# Patient Record
Sex: Female | Born: 1950
Health system: Southern US, Community
[De-identification: ages and names within clinical notes are randomized; demographics above are authoritative.]

## PROBLEM LIST (undated history)

## (undated) DIAGNOSIS — Z923 Personal history of irradiation: Secondary | ICD-10-CM

## (undated) DIAGNOSIS — E7800 Pure hypercholesterolemia, unspecified: Secondary | ICD-10-CM

## (undated) DIAGNOSIS — Z9889 Other specified postprocedural states: Secondary | ICD-10-CM

## (undated) DIAGNOSIS — E079 Disorder of thyroid, unspecified: Secondary | ICD-10-CM

## (undated) DIAGNOSIS — K602 Anal fissure, unspecified: Secondary | ICD-10-CM

## (undated) DIAGNOSIS — M1990 Unspecified osteoarthritis, unspecified site: Secondary | ICD-10-CM

## (undated) DIAGNOSIS — S42309A Unspecified fracture of shaft of humerus, unspecified arm, initial encounter for closed fracture: Secondary | ICD-10-CM

## (undated) DIAGNOSIS — C50911 Malignant neoplasm of unspecified site of right female breast: Secondary | ICD-10-CM

## (undated) DIAGNOSIS — Z9221 Personal history of antineoplastic chemotherapy: Secondary | ICD-10-CM

## (undated) DIAGNOSIS — E039 Hypothyroidism, unspecified: Secondary | ICD-10-CM

## (undated) DIAGNOSIS — R112 Nausea with vomiting, unspecified: Secondary | ICD-10-CM

## (undated) HISTORY — DX: Anal fissure, unspecified: K60.2

## (undated) HISTORY — DX: Disorder of thyroid, unspecified: E07.9

## (undated) HISTORY — PX: MOUTH SURGERY: SHX715

## (undated) HISTORY — DX: Pure hypercholesterolemia, unspecified: E78.00

## (undated) HISTORY — DX: Unspecified osteoarthritis, unspecified site: M19.90

## (undated) HISTORY — DX: Unspecified fracture of shaft of humerus, unspecified arm, initial encounter for closed fracture: S42.309A

## (undated) HISTORY — PX: TUBAL LIGATION: SHX77

## (undated) HISTORY — PX: JOINT REPLACEMENT: SHX530

## (undated) HISTORY — PX: EYE SURGERY: SHX253

---

## 1997-03-05 HISTORY — PX: BREAST BIOPSY: SHX20

## 1997-10-14 ENCOUNTER — Other Ambulatory Visit: Admission: RE | Admit: 1997-10-14 | Discharge: 1997-10-14 | Payer: Self-pay | Admitting: Obstetrics and Gynecology

## 1997-11-03 ENCOUNTER — Other Ambulatory Visit: Admission: RE | Admit: 1997-11-03 | Discharge: 1997-11-03 | Payer: Self-pay | Admitting: Surgery

## 1997-11-03 DIAGNOSIS — C50911 Malignant neoplasm of unspecified site of right female breast: Secondary | ICD-10-CM

## 1997-11-03 HISTORY — DX: Malignant neoplasm of unspecified site of right female breast: C50.911

## 1997-11-03 HISTORY — PX: BREAST LUMPECTOMY: SHX2

## 1997-11-05 ENCOUNTER — Ambulatory Visit (HOSPITAL_BASED_OUTPATIENT_CLINIC_OR_DEPARTMENT_OTHER): Admission: RE | Admit: 1997-11-05 | Discharge: 1997-11-05 | Payer: Self-pay | Admitting: Surgery

## 1997-11-09 ENCOUNTER — Encounter: Admission: RE | Admit: 1997-11-09 | Discharge: 1998-02-07 | Payer: Self-pay | Admitting: Radiation Oncology

## 1997-11-19 ENCOUNTER — Ambulatory Visit (HOSPITAL_COMMUNITY): Admission: RE | Admit: 1997-11-19 | Discharge: 1997-11-20 | Payer: Self-pay | Admitting: Surgery

## 1997-11-19 ENCOUNTER — Encounter: Payer: Self-pay | Admitting: Surgery

## 1998-01-04 ENCOUNTER — Encounter: Payer: Self-pay | Admitting: Oncology

## 1998-01-04 ENCOUNTER — Ambulatory Visit (HOSPITAL_COMMUNITY): Admission: RE | Admit: 1998-01-04 | Discharge: 1998-01-04 | Payer: Self-pay | Admitting: Oncology

## 1998-02-22 ENCOUNTER — Encounter: Admission: RE | Admit: 1998-02-22 | Discharge: 1998-05-23 | Payer: Self-pay | Admitting: Radiation Oncology

## 1998-03-31 ENCOUNTER — Encounter: Payer: Self-pay | Admitting: Surgery

## 1998-03-31 ENCOUNTER — Ambulatory Visit (HOSPITAL_BASED_OUTPATIENT_CLINIC_OR_DEPARTMENT_OTHER): Admission: RE | Admit: 1998-03-31 | Discharge: 1998-03-31 | Payer: Self-pay | Admitting: Surgery

## 1998-10-18 ENCOUNTER — Other Ambulatory Visit: Admission: RE | Admit: 1998-10-18 | Discharge: 1998-10-18 | Payer: Self-pay | Admitting: Obstetrics and Gynecology

## 1999-03-24 ENCOUNTER — Encounter: Admission: RE | Admit: 1999-03-24 | Discharge: 1999-03-24 | Payer: Self-pay | Admitting: Oncology

## 1999-03-24 ENCOUNTER — Encounter: Payer: Self-pay | Admitting: Oncology

## 1999-08-24 ENCOUNTER — Encounter: Payer: Self-pay | Admitting: Oncology

## 1999-08-24 ENCOUNTER — Encounter: Admission: RE | Admit: 1999-08-24 | Discharge: 1999-08-24 | Payer: Self-pay | Admitting: Oncology

## 1999-10-24 ENCOUNTER — Other Ambulatory Visit: Admission: RE | Admit: 1999-10-24 | Discharge: 1999-10-24 | Payer: Self-pay | Admitting: Obstetrics and Gynecology

## 2000-07-25 ENCOUNTER — Other Ambulatory Visit: Admission: RE | Admit: 2000-07-25 | Discharge: 2000-07-25 | Payer: Self-pay | Admitting: Obstetrics and Gynecology

## 2000-08-27 ENCOUNTER — Encounter: Admission: RE | Admit: 2000-08-27 | Discharge: 2000-08-27 | Payer: Self-pay | Admitting: Oncology

## 2000-08-27 ENCOUNTER — Encounter: Payer: Self-pay | Admitting: Oncology

## 2000-10-30 ENCOUNTER — Other Ambulatory Visit: Admission: RE | Admit: 2000-10-30 | Discharge: 2000-10-30 | Payer: Self-pay | Admitting: Obstetrics and Gynecology

## 2001-05-27 ENCOUNTER — Encounter: Payer: Self-pay | Admitting: Family Medicine

## 2001-05-27 ENCOUNTER — Ambulatory Visit (HOSPITAL_COMMUNITY): Admission: RE | Admit: 2001-05-27 | Discharge: 2001-05-27 | Payer: Self-pay | Admitting: Family Medicine

## 2001-08-28 ENCOUNTER — Encounter: Admission: RE | Admit: 2001-08-28 | Discharge: 2001-08-28 | Payer: Self-pay | Admitting: Oncology

## 2001-08-28 ENCOUNTER — Encounter: Payer: Self-pay | Admitting: Oncology

## 2001-11-07 ENCOUNTER — Other Ambulatory Visit: Admission: RE | Admit: 2001-11-07 | Discharge: 2001-11-07 | Payer: Self-pay | Admitting: Obstetrics and Gynecology

## 2002-02-02 HISTORY — PX: BLADDER SUSPENSION: SHX72

## 2002-02-02 HISTORY — PX: VAGINAL HYSTERECTOMY: SUR661

## 2002-02-17 ENCOUNTER — Observation Stay (HOSPITAL_COMMUNITY): Admission: RE | Admit: 2002-02-17 | Discharge: 2002-02-18 | Payer: Self-pay | Admitting: Obstetrics and Gynecology

## 2002-02-17 ENCOUNTER — Encounter (INDEPENDENT_AMBULATORY_CARE_PROVIDER_SITE_OTHER): Payer: Self-pay

## 2002-02-18 ENCOUNTER — Encounter (INDEPENDENT_AMBULATORY_CARE_PROVIDER_SITE_OTHER): Payer: Self-pay | Admitting: Specialist

## 2002-08-31 ENCOUNTER — Encounter: Payer: Self-pay | Admitting: Oncology

## 2002-08-31 ENCOUNTER — Encounter: Admission: RE | Admit: 2002-08-31 | Discharge: 2002-08-31 | Payer: Self-pay | Admitting: Oncology

## 2003-09-01 ENCOUNTER — Encounter: Admission: RE | Admit: 2003-09-01 | Discharge: 2003-09-01 | Payer: Self-pay | Admitting: Internal Medicine

## 2003-09-01 ENCOUNTER — Encounter: Admission: RE | Admit: 2003-09-01 | Discharge: 2003-09-01 | Payer: Self-pay | Admitting: Oncology

## 2004-09-01 ENCOUNTER — Encounter: Admission: RE | Admit: 2004-09-01 | Discharge: 2004-09-01 | Payer: Self-pay | Admitting: Obstetrics and Gynecology

## 2005-09-03 ENCOUNTER — Encounter: Admission: RE | Admit: 2005-09-03 | Discharge: 2005-09-03 | Payer: Self-pay | Admitting: Oncology

## 2006-09-10 ENCOUNTER — Encounter: Admission: RE | Admit: 2006-09-10 | Discharge: 2006-09-10 | Payer: Self-pay | Admitting: Obstetrics & Gynecology

## 2007-09-15 ENCOUNTER — Encounter: Admission: RE | Admit: 2007-09-15 | Discharge: 2007-09-15 | Payer: Self-pay | Admitting: Family Medicine

## 2007-09-19 ENCOUNTER — Encounter: Admission: RE | Admit: 2007-09-19 | Discharge: 2007-09-19 | Payer: Self-pay | Admitting: Family Medicine

## 2007-09-29 ENCOUNTER — Other Ambulatory Visit: Admission: RE | Admit: 2007-09-29 | Discharge: 2007-09-29 | Payer: Self-pay | Admitting: Obstetrics and Gynecology

## 2008-09-20 ENCOUNTER — Encounter: Admission: RE | Admit: 2008-09-20 | Discharge: 2008-09-20 | Payer: Self-pay | Admitting: Family Medicine

## 2009-09-22 ENCOUNTER — Encounter: Admission: RE | Admit: 2009-09-22 | Discharge: 2009-09-22 | Payer: Self-pay | Admitting: Family Medicine

## 2009-11-17 ENCOUNTER — Ambulatory Visit: Payer: Self-pay | Admitting: Family Medicine

## 2009-11-25 ENCOUNTER — Ambulatory Visit: Payer: Self-pay | Admitting: Family Medicine

## 2010-03-27 ENCOUNTER — Encounter: Payer: Self-pay | Admitting: Family Medicine

## 2010-07-21 NOTE — Op Note (Signed)
[name], [name]                             ACCOUNT NO.:  [id number]   MEDICAL RECORD NO.:  [id number]                   PATIENT TYPE:  INP   LOCATION:  9317                                 FACILITY:  [facility]   PHYSICIAN:  Laqueta Linden, M.D.                 DATE OF BIRTH:  [birth year]   DATE OF PROCEDURE:  02/17/2002  DATE OF DISCHARGE:                                 OPERATIVE REPORT   PREOPERATIVE DIAGNOSES:  1. Postmenopausal bleeding with large endometrial polyp on tamoxifen.  2. Adnexal cyst.  3. Pelvic relaxation with cystocele and rectocele.  4. Stress urinary incontinence.   POSTOPERATIVE DIAGNOSES:  1. Postmenopausal bleeding with large endometrial polyp on tamoxifen.  2. Adnexal cyst.  3. Pelvic relaxation with cystocele and rectocele.  4. Stress urinary incontinence.   PROCEDURE:  1. Laparoscopically-assisted vaginal hysterectomy with bilateral salpingo-     oophorectomy, anterior and posterior colporrhaphy.  2. SPARC with cystoscopy.   SURGEON:  Laqueta Linden, M.D.   ASSISTANTS:  1. Andres Ege, M.D.  2. Procedure #2, Courtney Paris, M.D. with Laqueta Linden, M.D.   ANESTHESIA:  General endotracheal.   ESTIMATED BLOOD LOSS:  250 cc.   URINE OUTPUT:  300 cc.   FLUIDS:  3200 cc of crystalloid.   COUNTS:  Correct x2.   INDICATIONS:  The patient is a 60 year old gravida 2, para 2 menopausal  female with breast cancer on tamoxifen for the past four years.  She has had  persistent postmenopausal bleeding and was evaluated by a pelvic ultrasound  with sonohysterogram revealing a 3.2 cm x 2.1 cm broad based top that  virtually filled the endometrial cavity.  In addition, she was noted to have  a 2.3 cm cystic mass involving the left ovary that was consistent with an  endometrioma.  In addition, she had symptomatic cystocele and rectocele with  stress urinary incontinence.  She elected to undergo definitive surgical  management of the above.  She is  going to undergo laparoscopic evaluation of  the ovaries with laparoscopically-assisted vaginal hysterectomy with removal  of both tubes and ovaries as indicated followed by anterior and posterior  repair and SPARC procedure per Dr. Vic Blackbird.  She has seen both  surgeons and was extensively counseled as to the risks, benefits and  alternatives and complications including but not limited to anesthesia,  risks of infection, bleeding, possibly requiring transfusion, injury to  bowel, bladder or ureters, vessels, or nerves, possibility of fistula  formation, possibility of urinary retention, or recurrent prolapse or  recurrent stress urinary incontinence as well as postoperative urinary  urgency symptoms as well as other postoperative complications including DVT,  PE and pneumonia, death or other unknown risks.  She has seen the informed  consent film and has voiced understanding of the symptoms and all risks, and  agrees to proceed.  She  has undergone a mechanical bowel prep prior to  surgery and received 1 g of Ancef preoperatively for prophylaxis.   DESCRIPTION OF PROCEDURE:  The patient was taken to the operating room and  after proper identification and consent were ascertained, she was placed on  the operating table in the supine position.  After the induction of general  endotracheal anesthesia, she was placed in the [place] stirrups, and the  abdomen, perineum and vagina were prepped and draped in the routine sterile  fashion.  A transurethral Foley was placed.  A Hulka tenaculum was placed on  the anterior lip of the cervix.  Attention was then turned abdominally.  A 2  cm infraumbilical incision was made.  The Veress needle was inserted with  intraperitoneal placement confirmed by saline drop and saline installation.  Two liters of CO2 were infused and it appeared that the patient was  developing a pneumoperitoneum.  However, upon removal of the Veress and  insertion of the  laparoscope into the peritoneal cavity, it appeared that  most of the gas had gone into the subcu, and there was subcutaneous  emphysema noted.  There was no obvious injury to bowel, omentum or any  visible structures, and throughout the course of the procedure, the  subcutaneous emphysema resolved.  A pneumoperitoneum was established, and a  5 mm port was placed in the right mid quadrant and a 10 mm in the left mid  quadrant under direct vision.  Peritoneal washings were taken initially and  sent to cytology.  Inspection of the lower abdomen revealed a freely mobile  uterus.  Both tubes were status post Falope ring application.  Both ovaries  were small, atrophic, consistent with menopausal ovaries.  There was no cyst  involving the left ovary, however, there was a large multiple blebs of cysts  appearing to be some type of peritoneal inclusion cysts that was just  adjacent to the ovary.  This was freely mobile and was removed and  separately to pathology.  There was some additional cysts present in the cul-  de-sac, and these were excised and sent as a separate specimen as well.  These appeared to be just peritoneal blebs and to be completely benign in  appearance.  The tripolar cautery was utilized to cauterize and transect the  adnexa starting at the uterine cornua and transecting the round ligament,  utero-ovarian ligament and tube.  This was dissected back such that 0 Vicryl  Endo-loops could be placed across the infundibulopelvic ligaments.  Then 2-0  Vicryl Endoscopist-loops were placed and the tube and ovary was then  excised.  An identical procedure was performed on both sides.  Both adnexa  were easily removed through the 10 mm port and sent as a final specimen with  the uterus.  Hemostasis was noted to be excellent.  At this point, the  procedure went transvaginally.  A weighted speculum was placed on the posterior vagina and the cervix grasped with a single-tooth tenaculum.   The  portio was then injected circumferentially with 1:100,000 epinephrine.  The  cervix was then circumscribed and the posterior vaginal mucosa advanced off  the of cervix, and the cul-de-sac entered sharply using curved Mayo scissors  without difficulty.  Aspiration of lavaged fluid was accomplished.  The  uterosacral ligaments were then clamped and ligated with 0 Vicryl suture and  tacked.  Cardinal ligaments were similarly clamped, cut and suture ligated.  The anterior peritoneal reflection was identified and entered sharply with  an obvious injury or entry into the bladder.  A retractor was placed  anteriorly to retract the bladder out of the operative field.  The uterine  vessels were then clamped bilaterally incorporating both the anterior and  posterior peritoneum with pedicles cut and suture ligated.  At this point,  there was a small pedicle left on both sides.  This was clamped and cut with  removal of the uterus in its entirety which was sent with the tube and ovary  specimens for final sectioning.  Both pedicles were doubly ligated with 0  Vicryl.  Hemostasis was noted to be excellent.  There was some oozing from  the posterior vaginal cuff.  The posterior vaginal cuff was roofed to the  posterior peritoneum using a running locked stitch of 0 Vicryl with marked  improvement in hemostasis.  A McCall suture was placed through the vaginal  mucosa, through the uterosacral ligaments with plication of the cul-de-sac  mucosa to prevent an enterocele formation.  This suture was tied at the  conclusion of the procedure.  The parietal peritoneum was then closed in a  pursestring fashion using 2-0 Vicryl suture.  The counts were correct prior  to closure of the peritoneum.  The bottom half of the vaginal cuff was then  closed.  The uterosacral ties were then tied in the midline.  The cuff was  closed from side-to-side using interrupted figure-of-eight sutures of 0  Vicryl.  Attention was  then turned to the anterior vaginal wall.  The edges  of the anterior vaginal cuff were grasped with Allis clamps and sterile  saline used to provide hydrodissection was then injected, and the vaginal  mucosa was undermined and incised in the midline to the level of the mid  urethra per Dr. Valda Lamb instruction.  The vaginal mucosa was then  sharply and bluntly dissected off of the perivesical fascia.  At this point,  Dr. Aldean Ast performed a [facility] procedure with cystoscopy.  This will be  dictated in a separate operative note.  At the conclusion of the [facility]  procedure, the surgeon then continued with the anterior repair using 2-0  Vicryl suture to pull the paravesical tissues over the vaginal tape as per  Dr. Valda Lamb instructions as well as to reduce the remainder of the  cystocele higher up through its entire length.  Interrupted sutures were  placed with good reduction of the cystocele.  Redundant vaginal mucosa was trimmed, and the anterior vaginal wall was then closed with a running locked  stitch of 2-0 Vicryl suture.  The remainder of the anterior vaginal cuff was  closed with interrupted figure-of-eight sutures of 0 Vicryl.  Attention was  then turned to the posterior repair.  A inverted triangular incision was  made at the introitus with excision of scar tissue.  Injection with sterile  saline with undermining and incision of the vaginal mucosa was then  performed to the apex of the rectocele.   All tissues were then dissected off of the vaginal mucosa and 2-0 Vicryl  suture was used to pull the perirectal fascia to the midline with reduction  of the rectocele.  Redundant vaginal mucosa was trimmed and the posterior  vaginal wall was then closed in a running locked fashion with closure of the  perineum in a routine fashion.  Care was taken not to create a stepoff or to  erode any of the vagina.  At the conclusion of the procedure, the introitus  was two fingerbreadths and  approximately 6 cm in depth.  Rectal examination  confirmed redundant rectal mucosa but no sutures into the rectum.  A vaginal  packing soaked with estrogen cream was placed to be removed the morning  after surgery.  The Foley was also placed to be removed at the time the  packing was removed per Dr. Valda Lamb instructions.  The surgeon and scrub  tech then regloved and turned again abdominally.  A pneumoperitoneum was re-  established and pedicles and suture sites were observed and noted to have  excellent hemostasis.  The 10 mm port was removed under direct vision and  then had suture of the fascia performed while watching intra-abdominally.  The 5 mm port was then removed under direct vision as well.  The  pneumoperitoneum was allowed to escape and was actually reestablished with  no active bleeding or clotting in any of the pedicles.  The pneumoperitoneum  was then allowed to escape definitively and the laparoscope was then removed  under direct vision.  The umbilical incision was closed with subcuticular  suture of 4-0 Vicryl.  The other two incisions as well as the two suprapubic  incisions for the vaginal tape were closed using Dermabond.  Steri-Strips  and pressure dressings were applied to all incisions.  The incisions were  injected with a total of 10 cc of 0.25% Marcaine for local analgesia.  The  patient received Toradol 30 mg IV, 30 mg IM upon transfer to the recovery  room.  She was awakened and stable on transfer.  Estimated blood loss was  250 cc.  Urine output was 300 cc.  Fluids 3200 cc of crystalloid.  Counts  were correct x2.  Complications none.                                                Laqueta Linden, M.D.    LKS/MEDQ  D:  02/17/2002  T:  02/18/2002  Job:  [id number]   cc:   Leighton Roach. Truett Perna, M.D.  [address]- [facility]  [place]  [place]  [address]  Fax: [phone]   Sandria Bales. Ezzard Standing, M.D.  [address]  [place]  [place] [address]  Fax:  [phone]  Maryln Gottron, M.D.  [address] - [facility]  [place]  [place]  [address]  Fax: [phone]   Holley Bouche, M.D.  [address]  [place], [place] [address]  Fax: [phone]

## 2010-07-21 NOTE — Op Note (Signed)
NAME:  [name], [name]                             ACCOUNT NO.:  [id number]   MEDICAL RECORD NO.:  [id number]                   PATIENT TYPE:  INP   LOCATION:  9317                                 [facility]ACILITY:  [facility]   PHYSICIAN:  Rozanna Boer., M.D.      DATE OF BIRTH:  1950-06-01   DATE OF PROCEDURE:  02/17/2002  DATE OF DISCHARGE:                                 OPERATIVE REPORT   PREOPERATIVE DIAGNOSIS:  Stress urinary incontinence.   POSTOPERATIVE DIAGNOSIS:  Stress urinary incontinence.   PROCEDURE:  SPARC urethral vaginal sling procedure.   SURGEON:  Courtney Paris, M.D.   ASSISTANT:  Laqueta Linden, M.D.    General.   INDICATIONS:  This 60 year old patient who is postmenopausal and after  breast cancer who is undergoing a hysterectomy, bilateral salpingo-  oophorectomy, anterior and posterior repair and requests to have procedure  for stress urinary incontinence at the same time.  I had seen her previously  in the office and had gone over all the risks, complications, and benefits  of a sling procedure.   DESCRIPTION OF PROCEDURE:  After the uterus had been removed, the cuff had  been closed, and the anterior repair had been divided up to the mid urethral  area by Dr. Myrlene Broker, I was able to use a little saline and dissect on  either side of the urethra just enough to get my finger into the space just  lateral to the pelvic bone.  Then a small stab incision two fingerbreadths  above the symphysis pubis on either side of the midline was then made and  opened with a hemostat.  I then passed the [facility] needles hugging the  symphysis pubis down to the previously created space on each side of the mid  urethra and popped these through the endopelvic fascia.  This was done with  the Foley catheter in place so that I could know where the bladder neck was.  Then I attached the SPARC sling to the needles and then pulled this up into  the suprapubic space.  Using a  hemostat clamp, I left a space that was  fairly loose probably at least 1 cm or more between the urethra and the  sling to make sure it was loose and then remove the sheath of the [facility]  tape.  This was done first on the left and then the right with the midline  where it was supposed to me.  I then just a little tension with the blue  suture that was running along the tape and then cut the tape down below the  skin line in the suprapubic area on either side.  This left the sling in a  good position, loose but in a good anatomic position to prevent her stress  incontinence.  Prior to pulling up the tapes I passed a cystoscope with a  right angle lens and made sure  that the bladder had not been transversed by  the needles which it was not.  A Foley catheter was then replaced, and the  bladder drained and the vaginal tissue was then closed over the tape in two  layers with Vicryl sutures.  When this was done, the rest of the procedure  will be dictated by Dr. Myrlene Broker.  Dermabond will be  placed on the suprapubic site, and she will have a catheter overnight and  then removed in the morning.  If she is unable to void, the catheter will be  reinserted, and I will see her in the office tomorrow.  I plan to see her in  one week in follow up to make sure she is voiding well.                                               Rozanna Boer., M.D.    HMK/MEDQ  D:  02/17/2002  T:  02/18/2002  Job:  [id number]

## 2010-08-08 ENCOUNTER — Ambulatory Visit (INDEPENDENT_AMBULATORY_CARE_PROVIDER_SITE_OTHER): Payer: BC Managed Care – PPO | Admitting: Family Medicine

## 2010-08-08 ENCOUNTER — Encounter: Payer: Self-pay | Admitting: Family Medicine

## 2010-08-08 VITALS — BP 133/80 | Ht 64.0 in | Wt 145.0 lb

## 2010-08-08 DIAGNOSIS — M25569 Pain in unspecified knee: Secondary | ICD-10-CM | POA: Insufficient documentation

## 2010-08-08 MED ORDER — MELOXICAM 15 MG PO TABS: 15.0000 mg | ORAL_TABLET | Freq: Every day | ORAL | Status: AC

## 2010-08-08 NOTE — Progress Notes (Signed)
  Subjective:    Patient ID: [name], female    DOB: [birth year], 60 y.o.   MRN: [id number]  HPI 60 year old new patient to our office for evaluation of right knee pain. Pain has been ongoing intermittently for the past 1.5 years. She is an avid cyclist going 10-15 miles per week, recalls pain starting after a long ride approximately 1.5 years ago. No specific injury or trauma. Pain is worse along the medial aspect of the knee. She has increased pain and stiffness at rest. She is experiencing nighttime pain. Has occasional clicking, but denies any locking, catching, or instability. No significant swelling. Does feel like she has decreased range of motion and fullness in the back of her knee. Taking Advil 400-600 mg as needed. He is using a brace periodically. Remote history of knee injury 30 years ago while rafting, was never evaluated.  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 Denies any tobacco use. She is married. FAMILY history: Mother with rheumatoid arthritis   Review of Systems Per history of present illness, otherwise negative    Objective:   Physical Exam GENERAL: Alert and oriented x3, no acute distress, pleasant, well-developed, well-nourished SKIN: No rashes or lesions RESPIRATORY: Respirations 15 and unlabored MSK: - Knee: Right knee with range of motion 0-115 with 2+ crepitation. 1+ synovitis, trace effusion. Tender to palpation along the medial joint line, no lateral joint line tenderness. Positive McMurray medially. No ligamentous laxity. Left knee with range of motion 0-120 with 1+ crepitation, no swelling, no effusion, no tenderness, no laxity. - Hips: Full range of motion bilaterally without pain. Good strength. Negative logroll. - Gait: Walking without a limp, no leg length difference Neurovascularly intact distally  MSK ultrasound: Right knee- normal-appearing  quadriceps tendon. Increased fluid in the suprapatellar pouch consistent with a small joint effusion. Normal appearing patellar tendon. Lateral meniscus with fragmentation and small pseudo-meniscal cyst appreciated. Medial meniscus with fragmentation and extrusion from the joint noted. Images saved       Assessment & Plan:

## 2010-08-08 NOTE — Assessment & Plan Note (Signed)
Right knee pain with MSK ultrasound showing DJD as well as degenerative meniscal tear - Check standing x-rays to assess full extent of DJD - Will call with results when available - Rx for Mobic to help with pain and inflammation - Should continue with cycling as this will strengthen the knee - Continue to wear knee brace that she re: has - Followup 4 weeks for reevaluation, could consider intra-articular steroid injection at that time if symptoms persist

## 2010-08-08 NOTE — Patient Instructions (Signed)
1) You have arthritis & a degenerative tear in your meniscus (cartilage in your knee). 2) Start taking Mobic 1 tab daily by mouth to help with pain & inflammation 3) We will check x-rays of your knee to assess how much arthritis you have - we will call you at [phone] to discuss results. 4) Ok to continue biking - this is actually the best thing for this. 5) Continue to wear your knee brace 6) Follow-up in 4-6 weeks if having problems, otherwise as needed.

## 2010-08-09 ENCOUNTER — Ambulatory Visit
Admission: RE | Admit: 2010-08-09 | Discharge: 2010-08-09 | Disposition: A | Discharge status: Home / Self Care | Payer: BC Managed Care – PPO | Source: Ambulatory Visit | Attending: Family Medicine | Admitting: Family Medicine

## 2010-08-09 ENCOUNTER — Other Ambulatory Visit: Payer: Self-pay | Admitting: Family Medicine

## 2010-08-09 DIAGNOSIS — M25569 Pain in unspecified knee: Secondary | ICD-10-CM

## 2010-08-11 ENCOUNTER — Telehealth: Payer: Self-pay | Admitting: *Deleted

## 2010-08-11 NOTE — Telephone Encounter (Signed)
Message   Pt given the below message from Dr. Christella Hartigan.    It is ok for her to do yoga. She can do other types of exercise if she likes, but overall biking is the best. The hip pain is likely compensatory, I did not find anything significant on exam for the hip. The hip may flare intermittently, she can always come in for that if it worsens. Thanks Amy!   ----- Message ----- From: Resa Miner, RN Sent: 08/10/2010 3:37 PM To: Claris Che, MD  Called pt - gave her the message about x-rays She had the following questions: Can she do yoga? She is having hip pain on the same side as her knee pain- are these related? Should she be doing any other types of exercise besides biking?

## 2010-08-14 ENCOUNTER — Ambulatory Visit: Payer: BC Managed Care – PPO | Admitting: Family Medicine

## 2010-08-21 ENCOUNTER — Other Ambulatory Visit: Payer: Self-pay | Admitting: Family Medicine

## 2010-08-21 DIAGNOSIS — Z1231 Encounter for screening mammogram for malignant neoplasm of breast: Secondary | ICD-10-CM

## 2010-09-07 ENCOUNTER — Other Ambulatory Visit: Payer: Self-pay | Admitting: Family Medicine

## 2010-09-07 ENCOUNTER — Ambulatory Visit
Admission: RE | Admit: 2010-09-07 | Discharge: 2010-09-07 | Disposition: A | Discharge status: Home / Self Care | Payer: BC Managed Care – PPO | Source: Ambulatory Visit | Attending: Family Medicine | Admitting: Family Medicine

## 2010-09-07 DIAGNOSIS — M545 Low back pain, unspecified: Secondary | ICD-10-CM

## 2010-09-07 DIAGNOSIS — M5430 Sciatica, unspecified side: Secondary | ICD-10-CM

## 2010-09-25 ENCOUNTER — Ambulatory Visit: Payer: BC Managed Care – PPO

## 2010-09-26 ENCOUNTER — Ambulatory Visit: Payer: BC Managed Care – PPO

## 2010-09-28 ENCOUNTER — Ambulatory Visit (INDEPENDENT_AMBULATORY_CARE_PROVIDER_SITE_OTHER): Payer: BC Managed Care – PPO | Admitting: Sports Medicine

## 2010-09-28 ENCOUNTER — Encounter: Payer: Self-pay | Admitting: Sports Medicine

## 2010-09-28 VITALS — BP 142/84 | HR 72

## 2010-09-28 DIAGNOSIS — M7060 Trochanteric bursitis, unspecified hip: Secondary | ICD-10-CM

## 2010-09-28 DIAGNOSIS — IMO0002 Reserved for concepts with insufficient information to code with codable children: Secondary | ICD-10-CM

## 2010-09-28 DIAGNOSIS — M179 Osteoarthritis of knee, unspecified: Secondary | ICD-10-CM

## 2010-09-28 DIAGNOSIS — M1710 Unilateral primary osteoarthritis, unspecified knee: Secondary | ICD-10-CM

## 2010-09-28 DIAGNOSIS — M76899 Other specified enthesopathies of unspecified lower limb, excluding foot: Secondary | ICD-10-CM

## 2010-09-29 NOTE — Progress Notes (Signed)
Subjective:    Patient ID: [name], female    DOB: 22-Jun-1950, 60 y.o.   MRN: [id number]  HPI  [name] is a 60 yo female patient coming to f/u on her L knee pain from her previous appointment in 06/12. [name] is doing better, [name] has improve 70%, [name] did not tolerated the mobic it gave her GI upset , [name] was switched to nabumetone which [name] tolerated better. [name] is walking without a pain. [name] complains of a on and off snaping sensation on her knee, not frequent, not swelling. [name] had x ray of her knee which showed patellofemoral and medial compartment arthritis. [name] is still biking, in fact [name] bike 10 miles yesterday.  [name] developed new onset R hip pain about 3 weeks ago was seen by her PCP who switched her to nabumetone, ordered L spine xray which showed mild DDD L5-S1.  No hip OA, her pain is on and off, 5/10, worse w/activities, not radiated, improved by rest, no numbness or tingling to her R foot.  No past medical history on file. Current Outpatient Prescriptions on File Prior to Visit  Medication Sig Dispense Refill  . ibandronate (BONIVA) 150 MG tablet Take 150 mg by mouth every 30 (thirty) days. Take in the morning with a full glass of water, on an empty stomach, and do not take anything else by mouth or lie down for the next 30 min.       Marland Kitchen levothyroxine (SYNTHROID, LEVOTHROID) 100 MCG tablet Take 100 mcg by mouth daily.        . meloxicam (MOBIC) 15 MG tablet Take 1 tablet (15 mg total) by mouth daily.  30 tablet  1  . simvastatin (ZOCOR) 10 MG tablet Take 10 mg by mouth at bedtime.        . temazepam (RESTORIL) 7.5 MG capsule Take 7.5 mg by mouth at bedtime as needed.         No Known Allergies History   Social History  . Marital Status: Married    Spouse Name: N/A    Number of Children: N/A  . Years of Education: N/A   Social History Main Topics  . Smoking status: Never Smoker   . Smokeless tobacco: Never Used  . Alcohol Use: None  . Drug Use: None  . Sexually Active:  None   Other Topics Concern  . None   Social History Narrative  . None     Review of Systems  Constitutional: Negative for fever, chills and fatigue.  Musculoskeletal:       Knee (ain improving with HPI R hip pain w/HPI  Skin: Negative for pallor, rash and wound.       Objective:   Physical Exam  Constitutional: [name] appears well-developed and well-nourished.       BP 142/84  Pulse 72   Pulmonary/Chest: Effort normal and breath sounds normal.  Musculoskeletal:       R knee with intact skin, no swelling, no hematoma.  FROM, TTP in mid joint line. Negative Nigel Berthold. Patellofemoral crepitus, positive patellofemoral compression test. R hip with intact skin, no swelling no hematoma, FROM, TTP in the greater trochanter. Mild pain with resisted abduction. Normal gait.  Neurological: [name] is alert.  Skin: No rash noted. No pallor.  Psychiatric: [name] has a normal mood and affect. Judgment normal.      Assessment & Plan:   1. OA (osteoarthritis) of knee  Cont quad strengthening. Cont knee brace NSAID PRN Cont biking  2. Trochanteric bursitis  Start iliotibial band stretches We discuss the benefit of a cortisone injection the patient rather to wait. F/U in 4 weeks.

## 2010-10-02 ENCOUNTER — Other Ambulatory Visit: Payer: Self-pay | Admitting: Family Medicine

## 2010-10-02 ENCOUNTER — Ambulatory Visit
Admission: RE | Admit: 2010-10-02 | Discharge: 2010-10-02 | Disposition: A | Discharge status: Home / Self Care | Payer: BC Managed Care – PPO | Source: Ambulatory Visit | Attending: Family Medicine | Admitting: Family Medicine

## 2010-10-02 DIAGNOSIS — Z1231 Encounter for screening mammogram for malignant neoplasm of breast: Secondary | ICD-10-CM

## 2010-10-19 ENCOUNTER — Ambulatory Visit: Payer: BC Managed Care – PPO | Admitting: Sports Medicine

## 2011-05-09 ENCOUNTER — Encounter: Payer: Self-pay | Admitting: Sports Medicine

## 2011-05-09 ENCOUNTER — Ambulatory Visit (INDEPENDENT_AMBULATORY_CARE_PROVIDER_SITE_OTHER): Payer: BC Managed Care – PPO | Admitting: Sports Medicine

## 2011-05-09 ENCOUNTER — Ambulatory Visit: Payer: BC Managed Care – PPO | Admitting: Sports Medicine

## 2011-05-09 VITALS — BP 164/87 | HR 77 | Ht 65.0 in | Wt 140.0 lb

## 2011-05-09 DIAGNOSIS — M4056 Lordosis, unspecified, lumbar region: Secondary | ICD-10-CM | POA: Insufficient documentation

## 2011-05-09 DIAGNOSIS — G5701 Lesion of sciatic nerve, right lower limb: Secondary | ICD-10-CM

## 2011-05-09 DIAGNOSIS — G5700 Lesion of sciatic nerve, unspecified lower limb: Secondary | ICD-10-CM

## 2011-05-09 DIAGNOSIS — M4040 Postural lordosis, site unspecified: Secondary | ICD-10-CM

## 2011-05-09 MED ORDER — GABAPENTIN (ONCE-DAILY) 300 MG PO TABS: 1.0000 | ORAL_TABLET | Freq: Every day | ORAL | Status: DC

## 2011-05-09 MED ORDER — GABAPENTIN 300 MG PO CAPS: 300.0000 mg | ORAL_CAPSULE | Freq: Every day | ORAL | Status: DC

## 2011-05-09 NOTE — Assessment & Plan Note (Signed)
Likely etiology of sciatica pain, especially as she had so tenderness with deep palpation of piriformis muscle.  Also with marked lordosis seen on plain films as well as facet arthropathy of L4-L5 joint and L5-S1 joint, possibly also contributing to pain. Plan is to start Gabantin 300 mg QHS for neuropathic pain relief.   Also provided patient with strengthening and stretching excercises of abdominals, lower back, and piriformis. Hip opening exercises also provided.   FU 1 month for improvement.

## 2011-05-09 NOTE — Progress Notes (Signed)
  Subjective:    Patient ID: [name], female    DOB: 02-27-1951, 61 y.o.   MRN: [id number]  HPI Right Sciatic pain:  Began having pain about 2 months ago.  Describes lower back and buttock pain in Right  Buttock.  Had similar pain 1 year ago, relieved with stretching program developed by physical therapist.  However, pain recurred 2 months ago.    Attempted 2 weeks of BID Naproxen without much relief.  Saw PCP Dr. Philipp Deputy who prescribed Pred-pak which provided much relief, but pain returned after finishing Prednisone.   Pain radiates lateral aspect of thigh and calf.   Described as sharp shooting pain in LE.   Pain is worse when she awakens first thing in AM as well as at end of day when she has been on her feet for much of the day.   No fevers or chills, no urinary or bowel incontinence.    She also had MRI ordered which insurance would not cover.    Review of plain films of lumbar region reveals facet disk arthropathy and marked lordosis.     Review of Systems See HPI above for review of systems.       Objective:   Physical Exam  Gen:  Alert, cooperative patient who appears stated age in no acute distress.  Vital signs reviewed. MSK: Unable to appreciate extent of lordosis just with patient standing. Good flexibility, able to touch toes without effort with back flexion. TTP directly over piriformis muscle on right.   Hip flexor strength 5/5 BL Knee extension and flexion 5/5 BL Plantar and dorsal flexion 5/5 BL No decreased sensation BL LE's Faber test revealed good strength and no pain.  Abduction strength 5/5 Right side Straight leg raise is negative to 90 bilaterally No discogenic features are noted on testing  Note that piriformis stretches consistently bring out pain in 3 different positions      Assessment & Plan:

## 2011-05-09 NOTE — Patient Instructions (Signed)
Take 300 mg Gabapentin nightly for the next month.  4 stretches/exercises for your back:   1) Knee to shoulder Right 2) Knee to shoulder Left 3) Both knees to chest 4) Suck in belly button to your back and do sit-ups  Piriformis stretches: - Cross leg across your body - Cross leg and pull back on opposite leg  Hip exercises: - Cross-over step-ups - Standing lateral hip swings  Come back and see [place] in 1 month. Massage can also be helpful.

## 2011-05-11 NOTE — Progress Notes (Signed)
Addended by: Annita Brod on: 05/11/2011 10:22 AM   Modules accepted: Orders

## 2011-05-18 ENCOUNTER — Ambulatory Visit (INDEPENDENT_AMBULATORY_CARE_PROVIDER_SITE_OTHER): Payer: BC Managed Care – PPO | Admitting: Family Medicine

## 2011-05-18 VITALS — BP 130/82

## 2011-05-18 DIAGNOSIS — M5417 Radiculopathy, lumbosacral region: Secondary | ICD-10-CM

## 2011-05-18 DIAGNOSIS — IMO0002 Reserved for concepts with insufficient information to code with codable children: Secondary | ICD-10-CM

## 2011-05-18 MED ORDER — OXYCODONE-ACETAMINOPHEN 5-325 MG PO TABS: ORAL_TABLET | ORAL | Status: DC

## 2011-05-18 MED ORDER — PREDNISONE 50 MG PO TABS: ORAL_TABLET | ORAL | Status: AC

## 2011-05-18 NOTE — Patient Instructions (Signed)
Your MRI is schd for Thursday March 21st at 3:15pm Address for [facility] is [address] Please arrive at 2:45 for early registration Their number is [phone] if you need to call and reschd the appt.

## 2011-05-18 NOTE — Progress Notes (Signed)
Subjective:    Patient ID: [name], female    DOB: 1950/06/05, 61 y.o.   MRN: [id number]  HPI  Was seen for right posterior leg and buttock pain couple of weeks ago. Diagnosed with piriformis syndrome. Head similar symptoms a year or so ago and they resolve with stretching and exercise. This time she's been doing the stretches and exercise nothing seems to be getting worse. She had doubled her Neurontin a couple of days ago because pain was worsening. Last night pain became about 10 out of 10. She's having difficulty sleeping and sitting secondary to pain. She had a trip planned out of town today and is afraid she can't go because she doesn't think she can sit still that long.  Pain mostly in the right posterior hip and buttock it radiates down to a specific points on the right calf. It does not extend into the foot. She's had no weakness. No fecal incontinence. She has had 2 episodes of urge incontinence in the last few days which is unusual for her and she wondered if this could be related to increasing the Neurontin right now her pain is about 8/10. It becomes 10 out of 10 if she sits for very long.  PERTINENT  PMH / PSH: History of aphthous ulcers for which she took oral prednisone for several courses. This was tender 15 years ago. She is otherwise not had any chronic oral steroids. She has had one steroid dose pack 4 this episode of radiculopathy. Past medical history significant for breast cancer in the right breast about 10 or 12 years ago. She is status post lumpectomy. She is also had SPARC sling procedure for urinary incontinence when she had her hysterectomy and bilateral oophorectomy done.  Review of Systems Denies fever, sweats, chills. She's noted no erythema or warmth of the right lower extremity. See history of present illness above for additional details of the review of systems    Objective:   Physical Exam  Vital signs reviewed. GENERAL: Well developed, well nourished,  uncomfortable when sitting. She prefers to stand particularly if she can lean forward onto the table. BUTTOCK: Mild tenderness to palpation over the posterior ilium medially but this does not reproduce her pain. Is also mild tenderness over the performance muscle but this does not reproduce her pain. Straight leg raise is negative in both the supine position but is positive in the seated position. Pearlean Brownie is mildly painful on the right. Lower extremity strength is 5 out of 5 in flexion and extension at the hips. DTRs are 2+ bilaterally equal at the knee. Plantar and dorsiflexion 5 out 5 bilaterally equal. Gait is normal.  IMAGING REVIEW: I looked at her LS-spine films. She does have some slight scoliosis. Some degenerative changes particularly at the L5-S1 uncovertebral joints. Reasonable joint space except for L5-S1.     Assessment & Plan:  Worsening radiculopathy into the right lower stream A. We'll get MRI. As she is going out of town I will treat her aggressively with 5 days of steroids. I will leave her current Neurontin dose as he is and watch her urinary incontinence. We'll also give her some Percocet. She is instructed not to do any long distance driving and if she is a passenger she should get up and walk around about every hour and a half. We discussed red flags that would cause concern such as lower stream and weakness worsening urinary or new fecal incontinence, new numbness. I'll notify her after get the  MRI results back. I gave her information on how to contact us through the family practice center on-call system should she have emergency in the interim.

## 2011-05-24 ENCOUNTER — Other Ambulatory Visit: Payer: BC Managed Care – PPO

## 2011-05-25 ENCOUNTER — Ambulatory Visit
Admission: RE | Admit: 2011-05-25 | Discharge: 2011-05-25 | Disposition: A | Discharge status: Home / Self Care | Payer: BC Managed Care – PPO | Source: Ambulatory Visit | Attending: Family Medicine | Admitting: Family Medicine

## 2011-05-25 DIAGNOSIS — M5417 Radiculopathy, lumbosacral region: Secondary | ICD-10-CM

## 2011-05-28 ENCOUNTER — Ambulatory Visit (INDEPENDENT_AMBULATORY_CARE_PROVIDER_SITE_OTHER): Payer: BC Managed Care – PPO | Admitting: Sports Medicine

## 2011-05-28 VITALS — BP 132/79

## 2011-05-28 DIAGNOSIS — M4056 Lordosis, unspecified, lumbar region: Secondary | ICD-10-CM

## 2011-05-28 DIAGNOSIS — IMO0002 Reserved for concepts with insufficient information to code with codable children: Secondary | ICD-10-CM

## 2011-05-28 DIAGNOSIS — M48061 Spinal stenosis, lumbar region without neurogenic claudication: Secondary | ICD-10-CM

## 2011-05-28 DIAGNOSIS — M4040 Postural lordosis, site unspecified: Secondary | ICD-10-CM

## 2011-05-28 DIAGNOSIS — G5700 Lesion of sciatic nerve, unspecified lower limb: Secondary | ICD-10-CM

## 2011-05-28 DIAGNOSIS — G5701 Lesion of sciatic nerve, right lower limb: Secondary | ICD-10-CM

## 2011-05-28 MED ORDER — OXYCODONE-ACETAMINOPHEN 5-325 MG PO TABS: ORAL_TABLET | ORAL | Status: DC

## 2011-05-28 MED ORDER — GABAPENTIN (ONCE-DAILY) 300 MG PO TABS: 300.0000 mg | ORAL_TABLET | Freq: Three times a day (TID) | ORAL | Status: DC

## 2011-05-28 NOTE — Patient Instructions (Signed)
We will increase your gabapentin over time.  Today: 2 tabs mid day 2 tabs bed time  Tue: 2 tabs am 2 tabs mid day 2 tabs bed time  Wed: 2 tabs am 2 tabs mid day 2 tabs bed time  Thurs: 2 tabs am 2 tabs mid day 3 tabs bed time  Fri: 3 tabs am 2 tabs mid day 3 tabs bed time  Sat: 3 tabs am 3 tabs mid day 3 tabs bed time   Increasing gabapentin will likely increase its side effect of sedation.  Be careful of your activity while increasing this medication.  Be very careful of mixing percocet with this medication as you increase the dose.  We are giving you a new prescription for percocet but only use it if you feel that you have intolerable pain.  Follow up with [place] next week or sooner if you have problems.

## 2011-05-29 ENCOUNTER — Telehealth: Payer: Self-pay | Admitting: Family Medicine

## 2011-05-29 NOTE — Telephone Encounter (Signed)
Briefly discussed Will call her back this pm

## 2011-05-29 NOTE — Telephone Encounter (Signed)
Pt has spoken to Dr. Johna Roles, Viann Shove

## 2011-05-29 NOTE — Telephone Encounter (Signed)
The best time Dr. Jennette Kettle should call is before 5pm today and after 9 am tomorrow.

## 2011-05-30 NOTE — Progress Notes (Signed)
  Subjective:    Patient ID: [name], female    DOB: 03/20/1950, 61 y.o.   MRN: [id number]  HPI 61 y/o female with a history of LBP with radiculopathy is here for an acute exacerbation of her pain.  It started over the weekend after working hours in her garden the previous day.  She usually takes percocet for her pain which helps.  She also takes 2 300 mg gabapentin at night which help her night pain.  No incontinence, saddle anesthesia, or leg weakness.  Now gets a lot of pain with any prolonged standing and sometimes with sitting.   Review of Systems     Objective:   Physical Exam  Tenderness bilateral paraspinal  No midline tenderness Motion is preserved but she gets pain with flexion and extension Positive straight leg raise Bilateral LE preserved DTR's 2+ bilaterallly  MRI: 1. Chronic grade 1 anterolisthesis of L4 on L5 associated with  severe facet arthropathy. Right side synovial cyst contribute to  severe spinal and right lateral recess stenosis at this level  (severe involvement of the descending right L5 nerve root).  2. Otherwise predominately mild for age degenerative changes in  the lumbar spine. There is multifactorial mild to moderate right  L5 foraminal stenosis at L5-S1.        Assessment & Plan:

## 2011-05-30 NOTE — Assessment & Plan Note (Signed)
Her MRI has confirmed the suspicion that she has nerve pathology at the L4-5 level.  We have discussed options for treatment including surgery, physical therapy, and steroid injections.  She would like to pursue the most conservative treatment available.  To that end we will optimize her dose of gabapentin to achieve symptoms relief.  She will follow up with [place] in one week.

## 2011-05-31 ENCOUNTER — Other Ambulatory Visit: Payer: Self-pay | Admitting: *Deleted

## 2011-05-31 DIAGNOSIS — M48061 Spinal stenosis, lumbar region without neurogenic claudication: Secondary | ICD-10-CM | POA: Insufficient documentation

## 2011-05-31 MED ORDER — OXYCODONE-ACETAMINOPHEN 5-325 MG PO TABS: ORAL_TABLET | ORAL | Status: DC

## 2011-05-31 NOTE — Assessment & Plan Note (Signed)
This may require injection or even surgery if she cannot get relief with medications  Now using percocet and gabapentin - still having pain

## 2011-05-31 NOTE — Assessment & Plan Note (Signed)
Based on MRI result this is probably a secondary finding from nerve root impingement in lumbar spine

## 2011-05-31 NOTE — Progress Notes (Signed)
Refilled per Dr. Darrick Penna.  Pt states her pain has been getting increasingly worse despite titrating up on gabapentin.  She also asks if she can cycle this weekend because she is scheduled for a long ride next weekend, and she would like to see how she tolerates riding.  Per Dr. Darrick Penna ok to try a low intensity ride this weekend.  Pt agreeable, and she will stop by the office to pick up her rx for percocet.

## 2011-06-05 ENCOUNTER — Ambulatory Visit (INDEPENDENT_AMBULATORY_CARE_PROVIDER_SITE_OTHER): Payer: BC Managed Care – PPO | Admitting: Sports Medicine

## 2011-06-05 VITALS — BP 160/84

## 2011-06-05 DIAGNOSIS — IMO0002 Reserved for concepts with insufficient information to code with codable children: Secondary | ICD-10-CM

## 2011-06-05 DIAGNOSIS — M48061 Spinal stenosis, lumbar region without neurogenic claudication: Secondary | ICD-10-CM

## 2011-06-05 NOTE — Progress Notes (Signed)
  Subjective:    Patient ID: [name], female    DOB: Apr 15, 1950, 61 y.o.   MRN: [id number]  HPI  Pt presents to clinic for f/u of low back pain with radiculopathy which has gotten worse since last visit. States pain and tingling still radiates down her right leg to mid lateral calf.  Needing to take 6 percocets per day.  Has been on gabapentin 2700 mg per day since Friday with no relief.   She comes today with her husband to discuss options for either injection or surgery.  Review of Systems     Objective:   Physical Exam No acute distress but in some obvious pain No weakness in either extremity with sitting standing or walking  Review of MRI shows a synovial cyst that's approximately 10 mm in diameter and it impinges on the nerve root of L4 and L5 as it exits to the right There is also some slight antero-listhesis and signs of degenerative disc bulging and facet joint arthropathy in the lower lumbar spine       Assessment & Plan:

## 2011-06-05 NOTE — Assessment & Plan Note (Signed)
I suggested that she see Dr. Maurice Small for his opinion to see if injection of the cyst as an option  If steroid injection if not a reasonable option she may need to consider neurosurgical referral.  This was discussed in depth with both her and her husband and all MRI films reviewed. She would like to proceed with the consultation and we will not make additional medical changes until that is complete.

## 2011-06-05 NOTE — Patient Instructions (Signed)
You have been scheduled for an appointment with Dr. Maurice Small at [facility] on 06/07/11 at 8:30 am.  Please arrive at 8 am to fill out paper work.  Their phone number is [phone].

## 2011-10-16 ENCOUNTER — Other Ambulatory Visit: Payer: Self-pay | Admitting: Family Medicine

## 2011-10-16 DIAGNOSIS — Z1231 Encounter for screening mammogram for malignant neoplasm of breast: Secondary | ICD-10-CM

## 2011-11-01 ENCOUNTER — Ambulatory Visit: Payer: BC Managed Care – PPO

## 2011-11-08 ENCOUNTER — Ambulatory Visit
Admission: RE | Admit: 2011-11-08 | Discharge: 2011-11-08 | Disposition: A | Discharge status: Home / Self Care | Payer: BC Managed Care – PPO | Source: Ambulatory Visit | Attending: Family Medicine | Admitting: Family Medicine

## 2011-11-08 DIAGNOSIS — Z1231 Encounter for screening mammogram for malignant neoplasm of breast: Secondary | ICD-10-CM

## 2012-10-13 ENCOUNTER — Other Ambulatory Visit: Payer: Self-pay

## 2012-10-13 DIAGNOSIS — Z1231 Encounter for screening mammogram for malignant neoplasm of breast: Secondary | ICD-10-CM

## 2012-11-13 ENCOUNTER — Inpatient Hospital Stay: Admission: RE | Admit: 2012-11-13 | Payer: BC Managed Care – PPO | Source: Ambulatory Visit

## 2012-11-26 ENCOUNTER — Ambulatory Visit
Admission: RE | Admit: 2012-11-26 | Discharge: 2012-11-26 | Disposition: A | Discharge status: Home / Self Care | Payer: BC Managed Care – PPO | Source: Ambulatory Visit

## 2012-11-26 DIAGNOSIS — Z1231 Encounter for screening mammogram for malignant neoplasm of breast: Secondary | ICD-10-CM

## 2012-12-12 ENCOUNTER — Encounter: Payer: Self-pay | Admitting: Certified Nurse Midwife

## 2012-12-15 ENCOUNTER — Ambulatory Visit (INDEPENDENT_AMBULATORY_CARE_PROVIDER_SITE_OTHER): Payer: BC Managed Care – PPO | Admitting: Certified Nurse Midwife

## 2012-12-15 ENCOUNTER — Encounter: Payer: Self-pay | Admitting: Certified Nurse Midwife

## 2012-12-15 VITALS — BP 120/68 | HR 60 | Resp 16 | Ht 65.5 in | Wt 145.0 lb

## 2012-12-15 DIAGNOSIS — Z01419 Encounter for gynecological examination (general) (routine) without abnormal findings: Secondary | ICD-10-CM

## 2012-12-15 NOTE — Patient Instructions (Signed)

## 2012-12-15 NOTE — Progress Notes (Signed)
62 y.o. G63P2002 Married Caucasian Fe here for annual exam.  Menopausal, no vaginal bleeding or vaginal dryness. Not sexually active, mutual choice with patient and spouse. Sees PCP for aex, labs and medication. All medications stable at present.   Patient's last menstrual period was 03/05/1997.          Sexually active: no  The current method of family planning is status post hysterectomy.    Exercising: yes  cycling & gardening Smoker:  no  Health Maintenance: Pap: 10-24-09 neg MMG:  9/14 neg. Colonoscopy:  2004 scheduled for 10/14 BMD:   8/13 TDaP:  2014 Labs: none Self breast exam: done monthly   reports that she has never smoked. She has never used smokeless tobacco. She reports that she drinks about 7.0 ounces of alcohol per week. She reports that she does not use illicit drugs.  Past Medical History  Diagnosis Date  . Cancer     breast  . Thyroid disease     hypothyroid  . Hypercholesteremia   . Depression   . Anxiety     Past Surgical History  Procedure Laterality Date  . Vaginal hysterectomy  12/03    LAVH-BSO  . Breast lumpectomy Right     Current Outpatient Prescriptions  Medication Sig Dispense Refill  . ALPRAZolam (XANAX) 0.5 MG tablet Take 0.5 mg by mouth as needed.      . B Complex-C-Folic Acid (STRESS B COMPLEX PO) Take by mouth daily.      Marland Kitchen BIOTIN PO Take by mouth daily.      . Cholecalciferol (VITAMIN D PO) Take by mouth daily.      . Coenzyme Q10 (CO Q 10 PO) Take by mouth daily.      Marland Kitchen escitalopram (LEXAPRO) 10 MG tablet Take 10 mg by mouth as needed.       . ibandronate (BONIVA) 150 MG tablet Take 150 mg by mouth every 30 (thirty) days. Take in the morning with a full glass of water, on an empty stomach, and do not take anything else by mouth or lie down for the next 30 min.       Marland Kitchen levothyroxine (SYNTHROID, LEVOTHROID) 100 MCG tablet Take 100 mcg by mouth daily.        . Multiple Vitamins-Minerals (MULTIVITAMIN PO) Take by mouth daily.      .  Omega-3 Fatty Acids (FISH OIL PO) Take by mouth daily.      . simvastatin (ZOCOR) 10 MG tablet Take 10 mg by mouth at bedtime.      . temazepam (RESTORIL) 7.5 MG capsule Take 7.5 mg by mouth at bedtime as needed.        . triamcinolone (KENALOG) 0.1 % paste Apply topically as needed.       No current facility-administered medications for this visit.    Family History  Problem Relation Age of Onset  . Rheum arthritis Mother     ROS:  Pertinent items are noted in HPI.  Otherwise, a comprehensive ROS was negative.  Exam:   BP 120/68  Pulse 60  Resp 16  Ht 5' 5.5" (1.664 m)  Wt 145 lb (65.772 kg)  BMI 23.75 kg/m2  LMP 03/05/1997 Height: 5' 5.5" (166.4 cm)  Ht Readings from Last 3 Encounters:  12/15/12 5' 5.5" (1.664 m)  05/09/11 5\' 5"  (1.651 m)  08/08/10 5\' 4"  (1.626 m)    General appearance: alert, cooperative and appears stated age Head: Normocephalic, without obvious abnormality, atraumatic Neck: no adenopathy, supple, symmetrical,  trachea midline and thyroid normal to inspection and palpation Lungs: clear to auscultation bilaterally Breasts: normal appearance, no masses or tenderness, No nipple retraction or dimpling, No nipple discharge or bleeding, No axillary or supraclavicular adenopathy Heart: regular rate and rhythm Abdomen: soft, non-tender; no masses,  no organomegaly Extremities: extremities normal, atraumatic, no cyanosis or edema Skin: Skin color, texture, turgor normal. No rashes or lesions Lymph nodes: Cervical, supraclavicular, and axillary nodes normal. No abnormal inguinal nodes palpated Neurologic: Grossly normal   Pelvic: External genitalia:  no lesions              Urethra:  normal appearing urethra with no masses, tenderness or lesions              Bartholin's and Skene's: normal                 Vagina: normal appearing vagina with normal color and discharge, no lesions              Cervix: absent              Pap taken: no Bimanual Exam:  Uterus:   uterus absent              Adnexa: no mass, fullness, tenderness and adnexa absent bilateral               Rectovaginal: Confirms               Anus:  normal sphincter tone, no lesions  A:  Well Woman with normal exam  Menopausal no HRT, S/P LAVH BSO due breast cancer  History of Breast cancer 2008  Hypothyroid stable medication with PCP  P:   Reviewed health and wellness pertinent to exam  Continue follow up as indicated  Pap smear as per guidelines   Mammogram yearly pap smear not taken today  counseled on breast self exam, mammography screening, menopause, adequate intake of calcium and vitamin D, diet and exercise, Kegel's exercises  return annually or prn  An After Visit Summary was printed and given to the patient.

## 2012-12-18 NOTE — Progress Notes (Signed)
Note reviewed, agree with plan.  Jakarri Lesko, MD  

## 2013-05-25 ENCOUNTER — Ambulatory Visit (INDEPENDENT_AMBULATORY_CARE_PROVIDER_SITE_OTHER): Payer: BC Managed Care – PPO | Admitting: Gynecology

## 2013-05-25 ENCOUNTER — Encounter: Payer: Self-pay | Admitting: Gynecology

## 2013-05-25 VITALS — BP 118/78 | HR 80 | Resp 16 | Ht 65.5 in

## 2013-05-25 DIAGNOSIS — IMO0002 Reserved for concepts with insufficient information to code with codable children: Secondary | ICD-10-CM

## 2013-05-25 DIAGNOSIS — N952 Postmenopausal atrophic vaginitis: Secondary | ICD-10-CM

## 2013-05-25 DIAGNOSIS — Z853 Personal history of malignant neoplasm of breast: Secondary | ICD-10-CM

## 2013-05-25 NOTE — Patient Instructions (Signed)
vagifem 16mcg in vagina-ask Dr Ammie Dalton Cocoanut oil, EVOO emerita-whole foods-aloe product for vaginal moisture

## 2013-05-25 NOTE — Progress Notes (Signed)
Pt here to discuss postmenopausal dyspareunia.  She is s/p right breast cancer 1999-s/p chemo and RT and 5y of tamoxifen.  Pt states that she had used vaginal lubricants but was still having pain and she and husband have not been able to have vaginal penetration due to pain for years.  Pt has never been on vaginal estrogen Pt is seeing a counselor for other intimacy issues in marriage. Pt also has issues with lack of libido.  ROS: per HPI BP 118/78  Pulse 80  Resp 16  Ht 5' 5.5" (1.664 m)  LMP 03/05/1997 General appearance: alert, cooperative and appears stated age  Pelvic: External genitalia:  Postmenopausal changes              Urethra:  normal appearing urethra with no masses, tenderness or lesions              Bartholins and Skenes: normal                 Vagina: pale, thin, with petechiae, shortened and narrow                 A/P:  Postmenopausal vaginal atrophy history of breast cancer  marital issues   Pt with multiple issues related to dyspareunia both physical and social We discussed options: vagifem 17mcg in vagina-ask Dr Ammie Dalton as she is 15y out from breast cancer Can try Cocoanut oil, EVOO-instructed on use emerita-whole foods-aloe product for vaginal moisture Will continue to see therapist and work on relationship with spouse

## 2013-06-01 ENCOUNTER — Telehealth: Payer: Self-pay | Admitting: *Deleted

## 2013-06-01 NOTE — Telephone Encounter (Signed)
Per Dr. Benay Spice; called and spoke with pt informing her that there's no good evidence one way or another regarding this; anytime you use estrogen there is a chance it could cause cancer/relapse; up to pt to decide if she wants to take the risk.  Pt verbalized understanding and expressed appreciation for call back.

## 2013-06-01 NOTE — Telephone Encounter (Signed)
Pt called and left message asking "my gynecologist wants to start me on Vagifem 10 mg and I want Dr. Gearldine Shown recommendation since I'm a breast cancer survivor since 1999; or is this contraindicated?"  Note to Dr. Benay Spice.

## 2013-06-10 ENCOUNTER — Telehealth: Payer: Self-pay | Admitting: Gynecology

## 2013-06-10 NOTE — Telephone Encounter (Signed)
LM re vaginal estrogen use after breast cancer per Dr Benay Spice, asked to call back

## 2013-06-26 ENCOUNTER — Ambulatory Visit (INDEPENDENT_AMBULATORY_CARE_PROVIDER_SITE_OTHER): Payer: BC Managed Care – PPO | Admitting: Family Medicine

## 2013-06-26 ENCOUNTER — Encounter: Payer: Self-pay | Admitting: Family Medicine

## 2013-06-26 VITALS — BP 135/80 | Ht 65.0 in | Wt 140.0 lb

## 2013-06-26 DIAGNOSIS — M25569 Pain in unspecified knee: Secondary | ICD-10-CM

## 2013-06-26 NOTE — Progress Notes (Signed)
   Subjective:    Patient ID: [name], female    DOB: [birth year], 63 y.o.   MRN: [id number]  HPI Right knee pain. Just finished a three-day biking to work. On the day she did 40 miles she had significant pain and difficulty fully straightening her leg afterward. She's had pain since. She's concerned that she's done further damage. Has another weeklong biking trip planned in September. Has noted no swelling of the knee. No erythema or warmth of the knee.   Review of Systems No fever, sweats, chills, unusual weight change.    Objective:   Physical Exam Vital signs are reviewed and GENERAL: Well-developed female no acute distress KNEES: Bilaterally there is no effusion erythema or warmth. The right knee has full flexion. Extension is full with the last 5 is extremely stiff. She has mild crepitus on extension. No lateral joint line tenderness, no medial joint line tenderness. Mild tenderness over the insertion of the quadriceps tendon on the patellar pole. Distally neurovascularly intact. Ligaments intact to varus and valgus stress.   IMAGING: I reviewed her knee x-rays from 2012. Shows some medial joint space loss and some mild osteoarthritic changes.    Assessment & Plan:  Osteoarthritis right knee. We discussed options. Mostly she one reassurance that she's not done further damage and I think she's fine from that respect. Her symptoms seem to be improving. I think she can continue regular activities. We did discuss corticosteroid injection if she want to try that and she did not. She may want to pursue that if she doesn't have total resolution of her symptoms or perhaps prior to her next biking event. I would like to get some updated x-rays and have been worried for that. Should followup when necessary

## 2013-06-30 ENCOUNTER — Ambulatory Visit
Admission: RE | Admit: 2013-06-30 | Discharge: 2013-06-30 | Disposition: A | Discharge status: Home / Self Care | Payer: BC Managed Care – PPO | Source: Ambulatory Visit | Attending: Family Medicine | Admitting: Family Medicine

## 2013-06-30 DIAGNOSIS — M25569 Pain in unspecified knee: Secondary | ICD-10-CM

## 2013-07-06 ENCOUNTER — Ambulatory Visit: Payer: BC Managed Care – PPO | Admitting: Family Medicine

## 2013-07-07 ENCOUNTER — Telehealth: Payer: Self-pay | Admitting: *Deleted

## 2013-07-08 NOTE — Telephone Encounter (Signed)
[name] She has moderate to severe arthritis---particularly on the RIGHT knee--significant joint space loss. I will send her a copy of the report THANKS! Dickie La

## 2013-07-09 ENCOUNTER — Encounter: Payer: Self-pay | Admitting: Family Medicine

## 2013-07-13 ENCOUNTER — Encounter: Payer: Self-pay | Admitting: *Deleted

## 2013-07-13 ENCOUNTER — Telehealth: Payer: Self-pay | Admitting: *Deleted

## 2013-07-14 NOTE — Telephone Encounter (Signed)
Sent message to dr Nori Riis

## 2013-10-22 ENCOUNTER — Other Ambulatory Visit: Payer: Self-pay

## 2013-10-22 DIAGNOSIS — Z1231 Encounter for screening mammogram for malignant neoplasm of breast: Secondary | ICD-10-CM

## 2013-11-10 ENCOUNTER — Ambulatory Visit (INDEPENDENT_AMBULATORY_CARE_PROVIDER_SITE_OTHER): Payer: BC Managed Care – PPO | Admitting: Sports Medicine

## 2013-11-10 ENCOUNTER — Encounter: Payer: Self-pay | Admitting: Sports Medicine

## 2013-11-10 VITALS — BP 133/64 | Ht 65.0 in | Wt 140.0 lb

## 2013-11-10 DIAGNOSIS — M1710 Unilateral primary osteoarthritis, unspecified knee: Secondary | ICD-10-CM

## 2013-11-10 DIAGNOSIS — M25561 Pain in right knee: Secondary | ICD-10-CM

## 2013-11-10 DIAGNOSIS — M25569 Pain in unspecified knee: Secondary | ICD-10-CM | POA: Diagnosis not present

## 2013-11-10 DIAGNOSIS — M1711 Unilateral primary osteoarthritis, right knee: Secondary | ICD-10-CM | POA: Insufficient documentation

## 2013-11-10 MED ORDER — TRAMADOL HCL 50 MG PO TABS: 50.0000 mg | ORAL_TABLET | Freq: Two times a day (BID) | ORAL | Status: DC

## 2013-11-10 NOTE — Progress Notes (Signed)
Patient ID: [name], female   DOB: 03-21-1950, 63 y.o.   MRN: [id number]  Patient has seen me in past for degenerative meniscus More recently has had an increase in RT knee pain She is doing a lot of cycling Pain is often worse at night  Seen this past May by Dr Nori Riis who found advanced medial compt DJD confirmed on XRay on RT  She has done exercises and improved strength  Now pain is worse when she tries bikign and exercise together and particularly after long rides  PEXAM Pleasnt and NAD BP 133/64  Ht 5\' 5"  (1.651 m)  Wt 140 lb (63.504 kg)  BMI 23.30 kg/m2  LMP 03/05/1997  RT knee shows obvious DJD changes on inspection LT changes are mild Warmth over RTknee Fullness in RT  Sp pouch  Stable ligaments  Flexion to 140 vs 160 on left Extension is - 5 deg vs 0 left  McMurray is nonpainful but lots of crepitaiton at joint line and PF joint  XRay review : sclerosis and loss of joint space RT medial compt  [place]:  There is a moderately large effusion in RT SP pouch but not on left Lots of calcifications floating in space  Degenerative meniscal changes with bulging and pseudo cyst medial Spurring is significant Pat tendon and quad tendon intact  Left knee does show mild spurring as well

## 2013-11-10 NOTE — Assessment & Plan Note (Signed)
Suggest starting tramadol 50 bid Titrate up and down based on pain Side effects advised  Follow in 6 weeks post major bike ride

## 2013-11-10 NOTE — Assessment & Plan Note (Signed)
Use compression sleeve for standing and walking  Keep up biking as this is good rehab  Can reduce exercises if she bikes   Ice post exercise  NSAIDs prn but dont use daily

## 2013-11-19 ENCOUNTER — Telehealth: Payer: Self-pay | Admitting: Certified Nurse Midwife

## 2013-11-19 NOTE — Telephone Encounter (Signed)
Pt wants to know if it is okay to use Arnica(The RUB) and Chamoix Cream. Pt said she cycles( bike riding) and she is going to be in [place] cycling a lot and wants to make sure its okay to use these medications

## 2013-11-20 NOTE — Telephone Encounter (Signed)
Message left to return call to [place] at [phone].   Arnicare Cream: massaging sore muscles.Uses: Temporarily relieves muscle pain and stiffness due to minor injuries, overexertion and falls, Reduces pain, swelling and discoloration from bruises. Homeopathic.    Will advise that [name] CNM is out of office but will have her review.  Will advise that these treatments are otc and if used as directed should be ok. Will advised to not apply to vaginal area, external use only.

## 2013-11-20 NOTE — Telephone Encounter (Signed)
Patient returned call. Advised that Regina Eck CNM is out of the office but would have her review. Patient states she goes on 500 mile bike rides and that she is thinking about purchasing the creams, specifically called the Rub cream and that she applies it to external vaginal area because she gets pains from sitting on the bike seat. Patient states that this medication is homeopathic and wondering if okay to use.  Advised would sent ingredients to [facility] and obtain her opinion:   Ingredients of the Rub:  Arnica montana (leopard's bane) 1X 8%, aconitum napellus (monk's hood) 1X, belladonna (nightshade) 1X, calendula officinalis (garden marigold) 1X, hamamelis virginica (witch hazel) 1X, hypericum perforatum  (st. john's wort) 1X, ruta graveolens (rue) 1X, symphytum offinale (comfrey) 1X.

## 2013-11-22 NOTE — Telephone Encounter (Signed)
Reviewed the combination and do not feel appropriate for labia. Arnica cream alone OTC is OK to use

## 2013-11-23 NOTE — Telephone Encounter (Signed)
Spoke with patient and message from Regina Eck CNM given.  Patient verbalized understanding. Will follow up prn.

## 2013-12-08 ENCOUNTER — Ambulatory Visit
Admission: RE | Admit: 2013-12-08 | Discharge: 2013-12-08 | Disposition: A | Discharge status: Home / Self Care | Payer: BC Managed Care – PPO | Source: Ambulatory Visit

## 2013-12-08 DIAGNOSIS — Z1231 Encounter for screening mammogram for malignant neoplasm of breast: Secondary | ICD-10-CM

## 2013-12-16 ENCOUNTER — Ambulatory Visit (INDEPENDENT_AMBULATORY_CARE_PROVIDER_SITE_OTHER): Payer: BC Managed Care – PPO | Admitting: Certified Nurse Midwife

## 2013-12-16 ENCOUNTER — Encounter: Payer: Self-pay | Admitting: Certified Nurse Midwife

## 2013-12-16 VITALS — BP 118/70 | HR 68 | Resp 16 | Ht 65.25 in | Wt 141.0 lb

## 2013-12-16 DIAGNOSIS — Z01419 Encounter for gynecological examination (general) (routine) without abnormal findings: Secondary | ICD-10-CM

## 2013-12-16 NOTE — Patient Instructions (Signed)

## 2013-12-16 NOTE — Progress Notes (Signed)
63 y.o. [id number] Married Caucasian Fe here for annual exam. Menopausal no vaginal bleeding and using OTC with good results for vaginal dryness. Sees PCP for aex and labs, all normal.  BMD showed osteoporosis, patient continues on Boniva with PCP management. No change in Thyroid or cholesterol medication. No health issues today.  Patient's last menstrual period was 03/05/1997.          Sexually active: No.  The current method of family planning is post menopausal and hysterectomy status.    Exercising: Yes.    Cycling Smoker:  no  Health Maintenance: Pap:  10/2009 neg MMG:  12/2013 BIRADS1: Neg Colonoscopy:  2004 BMD: 12/2013 Osteopenia  TDaP:  Up to date per pt Labs: PCP   reports that she has never smoked. She has never used smokeless tobacco. She reports that she drinks about 7 ounces of alcohol per week. She reports that she does not use illicit drugs.  Past Medical History  Diagnosis Date  . Breast cancer 11/1997    RIGHT  . Thyroid disease     hypothyroid  . Hypercholesteremia   . Depression   . Anxiety     Past Surgical History  Procedure Laterality Date  . Vaginal hysterectomy  12/03    LAVH-BSO  . Breast lumpectomy Right 11/1997    Current Outpatient Prescriptions  Medication Sig Dispense Refill  . ALPRAZolam (XANAX) 0.5 MG tablet Take 0.5 mg by mouth as needed.      . B Complex-C-Folic Acid (STRESS B COMPLEX PO) Take by mouth daily.      . Cholecalciferol (VITAMIN D PO) Take by mouth daily.      . Coenzyme Q10 (CO Q 10 PO) Take by mouth daily.      Marland Kitchen ibandronate (BONIVA) 150 MG tablet Take 150 mg by mouth every 30 (thirty) days. Take in the morning with a full glass of water, on an empty stomach, and do not take anything else by mouth or lie down for the next 30 min.       Marland Kitchen levothyroxine (SYNTHROID, LEVOTHROID) 100 MCG tablet Take 100 mcg by mouth daily.        . Multiple Vitamins-Minerals (MULTIVITAMIN PO) Take by mouth daily.      . Omega-3 Fatty Acids (FISH OIL  PO) Take by mouth daily.      . simvastatin (ZOCOR) 10 MG tablet Take 10 mg by mouth at bedtime.      . temazepam (RESTORIL) 7.5 MG capsule Take 7.5 mg by mouth at bedtime as needed.        Marland Kitchen escitalopram (LEXAPRO) 10 MG tablet Take 10 mg by mouth as needed.        No current facility-administered medications for this visit.    Family History  Problem Relation Age of Onset  . Rheum arthritis Mother     ROS:  Pertinent items are noted in HPI.  Otherwise, a comprehensive ROS was negative.  Exam:   BP 118/70  Pulse 68  Resp 16  Ht 5' 5.25" (1.657 m)  Wt 141 lb (63.957 kg)  BMI 23.29 kg/m2  LMP 03/05/1997 Height: 5' 5.25" (165.7 cm)  Ht Readings from Last 3 Encounters:  12/16/13 5' 5.25" (1.657 m)  11/10/13 5\' 5"  (1.651 m)  06/26/13 5\' 5"  (1.651 m)    General appearance: alert, cooperative and appears stated age Head: Normocephalic, without obvious abnormality, atraumatic Neck: no adenopathy, supple, symmetrical, trachea midline and thyroid normal to inspection and palpation Lungs: clear to auscultation  bilaterally Breasts: normal appearance, no masses or tenderness, No nipple retraction or dimpling, No nipple discharge or bleeding, No axillary or supraclavicular adenopathy Heart: regular rate and rhythm Abdomen: soft, non-tender; no masses,  no organomegaly Extremities: extremities normal, atraumatic, no cyanosis or edema Skin: Skin color, texture, turgor normal. No rashes or lesions Lymph nodes: Cervical, supraclavicular, and axillary nodes normal. No abnormal inguinal nodes palpated Neurologic: Grossly normal   Pelvic: External genitalia:  no lesions              Urethra:  normal appearing urethra with no masses, tenderness or lesions              Bartholin's and Skene's: normal                 Vagina: normal appearing vagina with normal color and discharge, no lesions              Cervix: absent              Pap taken: No. Bimanual Exam:  Uterus:  uterus absent               Adnexa: no mass, fullness, tenderness and adnexal absent bilateral               Rectovaginal: Confirms               Anus:  normal sphincter tone, no lesions  A:  Well Woman with normal exam  Menopausal no HRT S/P LAVH.BSO   History of Breast cancer right breast 2008  Osteoporosis with PCP management with Boniva  Hypothyroid/hypelipdemia stable medication with PCP management  P:   Reviewed health and wellness pertinent to exam  Pap smear not taken today  Discussed regular weight bearing exercise and Vitamin D(PCP)   counseled on breast self exam, mammography screening, osteoporosis, adequate intake of calcium and vitamin D, diet and exercise, Kegel's exercises  return annually or prn  An After Visit Summary was printed and given to the patient.

## 2013-12-22 ENCOUNTER — Ambulatory Visit: Payer: BC Managed Care – PPO | Admitting: Sports Medicine

## 2013-12-25 NOTE — Progress Notes (Signed)
Reviewed personally.  M. Suzanne Elesa Garman, MD.  

## 2014-01-04 ENCOUNTER — Encounter: Payer: Self-pay | Admitting: Certified Nurse Midwife

## 2014-05-13 ENCOUNTER — Ambulatory Visit (INDEPENDENT_AMBULATORY_CARE_PROVIDER_SITE_OTHER): Payer: BC Managed Care – PPO | Admitting: Sports Medicine

## 2014-05-13 ENCOUNTER — Encounter: Payer: Self-pay | Admitting: Sports Medicine

## 2014-05-13 VITALS — BP 136/63 | Ht 65.0 in | Wt 142.0 lb

## 2014-05-13 DIAGNOSIS — M7062 Trochanteric bursitis, left hip: Secondary | ICD-10-CM

## 2014-05-13 DIAGNOSIS — M76892 Other specified enthesopathies of left lower limb, excluding foot: Secondary | ICD-10-CM

## 2014-05-13 DIAGNOSIS — M7632 Iliotibial band syndrome, left leg: Secondary | ICD-10-CM | POA: Diagnosis not present

## 2014-05-13 MED ORDER — NITROGLYCERIN 0.2 MG/HR TD PT24: MEDICATED_PATCH | TRANSDERMAL | Status: DC

## 2014-05-13 NOTE — Progress Notes (Signed)
  [name] - 64 y.o. female MRN [id number]  Date of birth: 1951-03-04  SUBJECTIVE: CC: Left lateral hip pain, initial evaluation HPI:   Lateral hip pain that is rated as 1-2/10 that has been present since last summer.  She is using anti-inflammatories and ice without significant improvement.  It is waking her up at night when laying on this side.  Not related to activities, does not bother her while she is cycling.  Tramadol she had from prior injury is the only thing that has been of benefit but she does not wish to continue taking this.  No specific therapeutic exercises.  Does not associate with any type of injury or single incident. Insidious onset.  ROS: Denies any radicular symptoms, no significant left-sided knee pain. No significant back pain.  HISTORY:  Past Medical, Surgical, Social, and Family History reviewed & updated per EMR.  Pertinent Historical Findings include:  reports that she has never smoked. She has never used smokeless tobacco. Prior bilateral knee OA, right worse than left. Overall doing well. History of spinal stenosis with radiculopathy to the right leg.  Surgical history reviewed  OBJECTIVE:  VS:   HT:5\' 5"  (165.1 cm)   WT:142 lb (64.411 kg)  BMI:23.7          BP:136/63 mmHg  HR: bpm  TEMP: ( )  RESP:   PHYSICAL EXAM:  GENERAL: Adult Caucasian female. No acute distress PSYCH: Alert and appropriately interactive. SKIN: No open skin lesions or abnormal skin markings on areas inspected as below VASCULAR: PT and DP pulses 2+/4. Significant pretibial edema NEURO: Sensation is grossly intact in bilateral lower extremity. Bilateral negative straight leg raise. LEFT HIP: Overall well aligned. She has normal internal and external rotation of bilateral hips. Normal flexion extension. Extensor mechanism and hip flexor mechanism intact. TTP over left greater trochanter and over left proximal femur. No significant tenderness over distal ITB tract. Negative  Nobles test. Negative Ober's test. Hip aBduction strength 4/5 bilaterally with left worse than right. Left with increased TFL predominant.  DATA OBTAINED DURING VISIT: LIMITED MSK ULTRASOUND OF LEFT HIP: Findings:  There is hypoechoic change clinically maximus and trochanter as well as proximal femur. There is spurring over the greater trochanter at the insertion of the radius medius. Impression: greater trochanteric bursitis and glute medius tendinopathy with osteophytic spurring.  ASSESSMENT: 1. IT band syndrome, left   2. Greater trochanteric bursitis of left hip   3. Hip abductor tendinitis, left    No problems updated.   Suspect a glute medius tendinopathy that is caused minimal greater trochanteric spurring with reactive bursitis secondary to this. Given the pain is only minimal patient wishes to decline injection and we'll try nitroglycerin protocol given the duration.  PLAN: See problem based charting & AVS for additional documentation.  Nitroglycerin protocol.  HEP: Hip abduction exercises with focus on glute recruitment; standing hip flexion and external rotation; sideways platform steps > Return in about 8 weeks (around 07/08/2014) for repeat ultrasound.

## 2014-05-13 NOTE — Patient Instructions (Signed)
Nitroglycerin Protocol   Apply 1/4 nitroglycerin patch to affected area daily.  Change position of patch within the affected area every 24 hours.  You may experience a headache during the first 1-2 weeks of using the patch, these should subside.  If you experience headaches after beginning nitroglycerin patch treatment, you may take your preferred over the counter pain reliever.  Another side effect of the nitroglycerin patch is skin irritation or rash related to patch adhesive.  Please notify our office if you develop more severe headaches or rash, and stop the patch.  Tendon healing with nitroglycerin patch may require 12 to 24 weeks depending on the extent of injury.  Men should not use if taking Viagra, Cialis, or Levitra.   Do not use if you have migraines or rosacea.     Exercises as discussed

## 2014-07-06 ENCOUNTER — Other Ambulatory Visit: Payer: BC Managed Care – PPO | Admitting: Sports Medicine

## 2014-07-13 ENCOUNTER — Ambulatory Visit
Admission: RE | Admit: 2014-07-13 | Discharge: 2014-07-13 | Disposition: A | Discharge status: Home / Self Care | Payer: BC Managed Care – PPO | Source: Ambulatory Visit | Attending: Sports Medicine | Admitting: Sports Medicine

## 2014-07-13 ENCOUNTER — Encounter: Payer: Self-pay | Admitting: Sports Medicine

## 2014-07-13 ENCOUNTER — Ambulatory Visit (INDEPENDENT_AMBULATORY_CARE_PROVIDER_SITE_OTHER): Payer: BC Managed Care – PPO | Admitting: Sports Medicine

## 2014-07-13 VITALS — BP 125/69 | Ht 65.0 in | Wt 140.0 lb

## 2014-07-13 DIAGNOSIS — M5382 Other specified dorsopathies, cervical region: Secondary | ICD-10-CM | POA: Diagnosis not present

## 2014-07-13 DIAGNOSIS — M5090 Cervical disc disorder, unspecified, unspecified cervical region: Secondary | ICD-10-CM | POA: Diagnosis not present

## 2014-07-13 DIAGNOSIS — M629 Disorder of muscle, unspecified: Secondary | ICD-10-CM

## 2014-07-13 DIAGNOSIS — M5030 Other cervical disc degeneration, unspecified cervical region: Secondary | ICD-10-CM | POA: Insufficient documentation

## 2014-07-13 MED ORDER — AMITRIPTYLINE HCL 25 MG PO TABS: 25.0000 mg | ORAL_TABLET | Freq: Every day | ORAL | Status: DC

## 2014-07-13 NOTE — Progress Notes (Signed)
  [name] - 64 y.o. female MRN [id number]  Date of birth: 1950-09-09  SUBJECTIVE:  Including CC & ROS.  Patient is a pleasant 64 year old female presents today with right shoulder pain. Patient denies any associated trauma or injury. Shoulder pain started about 4-6 weeks ago. Reports the majority of pain is worse at night making it difficult to sleep. During the day she does not have as much pain but does have some tenderness in her upper trap with a trigger point that she can localize. She denies any difficulty with overhead activities. Denies any difficulty at work or with exercising at the gym. She denies any problems doing daily activity such as her hair or fastening her bra. She also denies any numbness tingling into the scapula, down the arm, or into the hand. She's been treating her pain with intermittent anti-inflammatories which she tries to avoid as much as possible and icing.   ROS: Review of systems otherwise negative except for information present in HPI  HISTORY: Past Medical, Surgical, Social, and Family History Reviewed & Updated per EMR. Pertinent Historical Findings include: Hyperlipidemia Hypothyroidism Osteoporosis currently on Boniva Insomnia  DATA REVIEWED: No previous shoulder or cervical neck x-rays available  PHYSICAL EXAM:  VS: BP:125/69 mmHg  HR: bpm  TEMP: ( )  RESP:   HT:5\' 5"  (165.1 cm)   WT:140 lb (63.504 kg)  BMI:23.3 SHOULDER EXAM:  General: well nourished Skin of UE: warm; dry, no rashes, lesions, ecchymosis or erythema. Vascular: radial pulses 2+ bilaterally Neurologically: Normal sensation with no sensory or motor defects in C4-C8, bilateral Observation is noticing some mild atrophy within the supraspinatus, no obvious atrophy of the infraspinatus Palpation: no tenderness over the [facility] joint, acromion, no bicipital grove tenderness, no supraspinatus tenderness ROM active/passive: symmetric full 180 degree of abduction and forward flexion, Appley's  scratch test equal bilaterally at L1. No evidence of scapular winging Strength testing: 5/5 symmetric strength in internal and external rotation, forward flexion, adduction and abduction     Special Test: Negative Neer's, neg Hawkins, negative empty can, negative Spurling sign with no radicular symptoms  MSK [place]: Examination of the shoulder reveals normal biceps on long and short axis view, normal subscapularis, normal supraspinatus, normal infraspinatus, normal acromioclavicular joint.  ASSESSMENT & PLAN: See problem based charting & AVS for pt instructions. Impression: Suspected cervical neck spasm secondary to underlying degenerative changes in the C-spine Possible suprascapular nerve impingement contributing to supraspinatus muscle atrophy  Recommendations: -We'll obtain to the 2-3 view cervical spine x-rays to evaluate for foraminal stenosis -We'll provide patient with a soft collar to help with muscle fatigue in the neck. -Placed patient on amitriptyline 25 mg at bedtime to help with radicular pain. Advises patient to take Elavil without the restoril for several days to assess her degree of sedation before taking the medications together which can cause increase sedation -Patient also has been taking tramadol for pain control which she may continue. -Will follow-up in 4 weeks to see if symptoms have improved.

## 2014-07-14 MED ORDER — AMITRIPTYLINE HCL 25 MG PO TABS: 25.0000 mg | ORAL_TABLET | Freq: Every day | ORAL | Status: DC

## 2014-07-23 ENCOUNTER — Telehealth: Payer: Self-pay | Admitting: *Deleted

## 2014-07-23 ENCOUNTER — Other Ambulatory Visit: Payer: Self-pay | Admitting: *Deleted

## 2014-07-23 MED ORDER — TRAMADOL HCL 50 MG PO TABS: 50.0000 mg | ORAL_TABLET | Freq: Two times a day (BID) | ORAL | Status: DC

## 2014-07-23 NOTE — Telephone Encounter (Signed)
Talked with Fields after discussing symptoms from the patient and he suggest the pt should increase elavil to 50mg  qhs along with restoril for one week. Left this message for pt per her because she was going to be in a meeting.

## 2014-07-27 ENCOUNTER — Encounter: Payer: Self-pay | Admitting: Sports Medicine

## 2014-07-27 ENCOUNTER — Ambulatory Visit (INDEPENDENT_AMBULATORY_CARE_PROVIDER_SITE_OTHER): Payer: BC Managed Care – PPO | Admitting: Sports Medicine

## 2014-07-27 VITALS — BP 134/72 | Ht 65.0 in | Wt 140.0 lb

## 2014-07-27 DIAGNOSIS — M5382 Other specified dorsopathies, cervical region: Secondary | ICD-10-CM

## 2014-07-27 DIAGNOSIS — M542 Cervicalgia: Secondary | ICD-10-CM

## 2014-07-27 DIAGNOSIS — M629 Disorder of muscle, unspecified: Secondary | ICD-10-CM

## 2014-07-27 NOTE — Assessment & Plan Note (Addendum)
I am concerned that she probably has a ruptured cervical disc  There is disc degeneration noted on her cervical spinal films at C4-5 and C5-6 There is also loss of vertebral height  Because of these changes I think we need to see if this is coming from a compression fracture of the neck versus a cervical disc herniation  We will schedule an MRI Cervical collar  Consider steroid burst if still painful once we review the MRI  History of osteoporosis-- want to check to be sure there's not compression  History of breast cancer- be sure there no bony lesions in the neck

## 2014-07-27 NOTE — Progress Notes (Signed)
Patient ID: [name], female   DOB: 19-Mar-1950, 64 y.o.   MRN: [id number]   BP 134/72 mmHg  Ht 5\' 5"  (1.651 m)  Wt 140 lb (63.504 kg)  BMI 23.30 kg/m2  LMP 03/05/1997  History: Neck spasms had become severe over the past week These go down into the upper right arm and particularly into the trapezius Taking tramadol for pain and this helps her sleep through the night We added amitriptyline at night and that helps as well  Pain and more tingling make the right arm very uncomfortable  Examination Patient appears to be in obvious discomfort She sits with the neck very rigid BP 134/72 mmHg  Ht 5\' 5"  (1.651 m)  Wt 140 lb (63.504 kg)  BMI 23.30 kg/m2  LMP 03/05/1997    Good reflexes Both upper extremities  Neck evaluation Right rotation increases spasms Can't lean to the left and limited to the right Very limited flexion, rotation  Grip strength and flexion extension of the wrist are maintained  Right trapezius muscle has marked spasm

## 2014-07-27 NOTE — Patient Instructions (Signed)
Use therma heat wrap on neck/shoulder Wear cervical collar as much as you can Increase tramadol to 4x a day to help get you through the pain and spasm Increase amitriptyline to twice a day   Follow up after MRI

## 2014-07-28 ENCOUNTER — Ambulatory Visit
Admission: RE | Admit: 2014-07-28 | Discharge: 2014-07-28 | Disposition: A | Discharge status: Home / Self Care | Payer: BC Managed Care – PPO | Source: Ambulatory Visit | Attending: Sports Medicine | Admitting: Sports Medicine

## 2014-07-28 DIAGNOSIS — M542 Cervicalgia: Secondary | ICD-10-CM

## 2014-07-28 DIAGNOSIS — M629 Disorder of muscle, unspecified: Secondary | ICD-10-CM

## 2014-07-28 DIAGNOSIS — M5382 Other specified dorsopathies, cervical region: Secondary | ICD-10-CM

## 2014-08-03 ENCOUNTER — Other Ambulatory Visit: Payer: Self-pay | Admitting: *Deleted

## 2014-08-03 MED ORDER — TRAMADOL HCL 50 MG PO TABS: 50.0000 mg | ORAL_TABLET | Freq: Two times a day (BID) | ORAL | Status: DC

## 2014-08-04 ENCOUNTER — Other Ambulatory Visit: Payer: Self-pay | Admitting: *Deleted

## 2014-08-04 DIAGNOSIS — S42309A Unspecified fracture of shaft of humerus, unspecified arm, initial encounter for closed fracture: Secondary | ICD-10-CM

## 2014-08-04 HISTORY — DX: Unspecified fracture of shaft of humerus, unspecified arm, initial encounter for closed fracture: S42.309A

## 2014-08-04 MED ORDER — TRAMADOL HCL 50 MG PO TABS: 50.0000 mg | ORAL_TABLET | Freq: Four times a day (QID) | ORAL | Status: DC

## 2014-08-10 ENCOUNTER — Encounter: Payer: Self-pay | Admitting: Sports Medicine

## 2014-08-10 ENCOUNTER — Ambulatory Visit (INDEPENDENT_AMBULATORY_CARE_PROVIDER_SITE_OTHER): Payer: BC Managed Care – PPO | Admitting: Sports Medicine

## 2014-08-10 VITALS — BP 150/62 | HR 76 | Ht 65.0 in | Wt 140.0 lb

## 2014-08-10 DIAGNOSIS — M4802 Spinal stenosis, cervical region: Secondary | ICD-10-CM

## 2014-08-10 DIAGNOSIS — M5030 Other cervical disc degeneration, unspecified cervical region: Secondary | ICD-10-CM

## 2014-08-10 MED ORDER — AMITRIPTYLINE HCL 50 MG PO TABS: 50.0000 mg | ORAL_TABLET | Freq: Every day | ORAL | Status: DC

## 2014-08-10 NOTE — Progress Notes (Signed)
S: [name] reports that she has some ongoing symptoms of her functional cervical stenosis. She endorses intermittent aching in her bilateral upper extremities, as well as tingling and numbness, worst in her medial left arm, extending all the way to her left 4th and 5th fingers. She reports that two weeks ago her symptoms were particularly bad one morning, and she states that she could barely move her neck and had aching throughout her left side. She does state that she has been compliant with her regimen of tramadol, amitriptyline, and ibuprofen, and has gotten some improvement overall over the past several weeks. However she states that she still has difficulty falling and staying asleep, and continues to suffer from sufficient aching and stiffness symptoms to disrupt her daily life.  O: Her resting posture is significant for a somewhat flexed neck, a position which she states makes her more comfortable. BP 150/62 mmHg  Pulse 76  Ht 5\' 5"  (1.651 m)  Wt 140 lb (63.504 kg)  BMI 23.30 kg/m2  LMP 03/05/1997  Tension is evident bilaterally in her trapezius muscles medial to the scapula, worse on the RTt side.  Significant pain with neck flexion and extension, as well as with lateral bending of the neck. Range of motion is limited somewhat throughout her neck exa by pain she is able to bend her neck to 40 degrees on the left and 20 degrees on the right./ Rotation on RT dec by 10 deg Good range of motion through the shoulders. Good and symmetric strength and range of motion at elbow, forearm, wrist, hands, and fingers. No weakness in testing C5 to T1  A/P: Functional cervical stenosis, evident on x-ray and MRI. We discussed at length her diagnosis, the radiographic features and etiology, and the management moving forward. She is in agreement with continued conservative management. We agreed to refer her to PT to work on her posture. We suggested that she continue the regimen of tramadol and  amitriptyline at her current dose. Will follow-up in 1 month to assess for improvement.  Note dictated by Yvone Neu, MS-4.  Edited and agree with assessment/  Ila Mcgill, MD

## 2014-08-10 NOTE — Assessment & Plan Note (Signed)
Generally dong better  Ergonomic changes helped  Use collar as needed for rest  Meds as noted (note probs using gabapentin in past)  Trial of PT  Recheck 4 to 6 wks

## 2014-08-12 ENCOUNTER — Ambulatory Visit: Payer: BC Managed Care – PPO | Attending: Sports Medicine | Admitting: Physical Therapy

## 2014-08-12 DIAGNOSIS — M6248 Contracture of muscle, other site: Secondary | ICD-10-CM | POA: Diagnosis not present

## 2014-08-12 DIAGNOSIS — M542 Cervicalgia: Secondary | ICD-10-CM | POA: Diagnosis not present

## 2014-08-12 DIAGNOSIS — M5382 Other specified dorsopathies, cervical region: Secondary | ICD-10-CM | POA: Insufficient documentation

## 2014-08-12 DIAGNOSIS — M436 Torticollis: Secondary | ICD-10-CM

## 2014-08-12 DIAGNOSIS — M62838 Other muscle spasm: Secondary | ICD-10-CM

## 2014-08-12 NOTE — Patient Instructions (Signed)
   Abeni Finchum PT, DPT, LAT, ATC  [facility] Phone: [phone]     

## 2014-08-12 NOTE — Therapy (Addendum)
[place], [place], [address] Phone: [phone]   Fax:  [phone]  Physical Therapy Evaluation  Patient Details  Name: [name] MRN: [id number] Date of Birth: 27-Apr-1950 Referring Provider:  Stefanie Libel, MD  Encounter Date: 08/12/2014      PT End of Session - 08/12/14 1544    Visit Number 1   Number of Visits 12   Date for PT Re-Evaluation 09/23/14   PT Start Time 1500   PT Stop Time 1545   PT Time Calculation (min) 45 min   Activity Tolerance Patient tolerated treatment well   Behavior During Therapy [facility] for tasks assessed/performed      Past Medical History  Diagnosis Date  . Breast cancer 11/1997    RIGHT  . Thyroid disease     hypothyroid  . Hypercholesteremia   . Depression   . Anxiety     Past Surgical History  Procedure Laterality Date  . Vaginal hysterectomy  12/03    LAVH-BSO  . Breast lumpectomy Right 11/1997    There were no vitals filed for this visit.  Visit Diagnosis:  Neck pain - Plan: PT plan of care cert/re-cert  Muscle spasms of neck - Plan: PT plan of care cert/re-cert  Stiffness of cervical spine - Plan: PT plan of care cert/re-cert      Subjective Assessment - 08/12/14 1506    Subjective pt is a 64 y.o F with CC of neck pain thats  been going on for about 6-8 weeks with referral down to both arms with the L>R. She reports symptoms have gotten better since onset   Limitations Lifting   How long can you sit comfortably? unlimited   How long can you stand comfortably? guessing hours   How long can you walk comfortably? multple hours   Diagnostic tests 07/28/2014 MRI Multilevel disc and facet degeneration with anterior slip, R foraminal encroachment   Patient Stated Goals To work on posture, release some tension in the shoulders.    Currently in Pain? Yes   Pain Score 0-No pain  .5/10   Pain Location Neck   Pain Orientation Right;Left  L>R   Pain  Descriptors / Indicators Aching   Pain Type Chronic pain   Pain Onset More than a month ago   Pain Frequency Constant   Aggravating Factors  laying down   Pain Relieving Factors laying like a board, Ice pack, cervical collar            [facility] PT Assessment - 08/12/14 1512    Assessment   Medical Diagnosis neck pain   Onset Date/Surgical Date --  6-8 weeks ago   Hand Dominance Right   Next MD Visit 09/14/2014   Prior Therapy yes for R knee   Precautions   Precautions None   Restrictions   Weight Bearing Restrictions No   Balance Screen   Has the patient fallen in the past 6 months No   Has the patient had a decrease in activity level because of a fear of falling?  No   Is the patient reluctant to leave their home because of a fear of falling?  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   Vocation Requirements prolonged sitting, and typing   Leisure gardening, reading, cooking, cycling   Cognition   Overall Cognitive Status Within Functional Limits for tasks assessed   Observation/Other Assessments   Focus on Therapeutic Outcomes (FOTO)  51% limited  predicted 34%   Posture/Postural Control   Posture/Postural Control Postural limitations   Postural Limitations Rounded Shoulders;Forward head;Decreased lumbar lordosis   ROM / Strength   AROM / PROM / Strength AROM;Strength   AROM   AROM  Assessment Site Cervical   Cervical Flexion 55   Cervical Extension 70   Cervical - Right Side Bend 22  pain during at end range   Cervical - Left Side Bend 32   Cervical - Right Rotation 45  pain noted at endrange   Cervical - Left Rotation 45   Strength   Strength Assessment Site Shoulder   Right/Left Shoulder Right;Left   Right Shoulder Flexion 5/5   Right Shoulder Extension 5/5   Right Shoulder ABduction 4/5   Right Shoulder Internal Rotation 5/5   Right Shoulder External Rotation 4/5   Left Shoulder Flexion 5/5   Left Shoulder Extension 5/5   Left Shoulder ABduction 5/5   Left Shoulder Internal Rotation 5/5   Left Shoulder External Rotation 5/5   Palpation   Palpation comment tenderness noted at the bil upper traps with the R>L with significant R trapezius spasm noted   Spurling's   Findings Negative   Distraction Test   Findngs Positive   Comment for increased pain and tightness   other    Findings Negative   Comment ULTT (ulnar)                   [facility] Adult PT Treatment/Exercise - 08/12/14 1512    Neck Exercises: Seated   Neck Retraction 10 reps;5 secs   Other Seated Exercise cervical ROM with 4 fingers 2 x 10   Neck Exercises: Stretches   Upper Trapezius Stretch 2 reps;30 seconds   Levator Stretch 2 reps;30 seconds                PT Education - 08/12/14 1543    Education provided Yes   Education Details evaluation findings, POC, Goals, HEP, anatomical education   Person(s) Educated Patient   Methods Explanation   Comprehension Verbalized understanding          PT Short Term Goals - 08/12/14 1548    PT SHORT TERM GOAL #1   Title pt will be I with basic HEP (09/02/2014)   Time 3   Period Weeks   Status New   PT SHORT TERM GOAL #2   Title She will increase her FOTO score by > 8 points to assist with increased functional capacity (09/02/2014)   Time 3   Period Weeks   Status New           PT Long Term Goals - 08/12/14 1549     PT LONG TERM GOAL #1   Title pt will be I with advanced HEP given throughout sessions upon discharge (09/23/2014)   Time 6   Period Weeks   Status New   PT LONG TERM GOAL #2   Title She will increase cervical rotation and sidebending by > 15 degrees  to assist with safety during driving (9/73/5329)   Time 6   Period Weeks   Status New   PT LONG TERM GOAL #3   Title She will decreased upper trap spasm with pain < 1/10 upon palpation to assist with increased cervical mobility (09/23/2014)   Time 6   Period Weeks   Status New   PT LONG TERM GOAL #4   Title She will be able to verbalize and demonstrate techniques to reduce risk or upper trap/neck reinjury via postural awareness, lifting and carrying mechanics, and HEP (09/23/2014)   Time 6   Period Weeks   Status New   PT LONG TERM GOAL #5   Title She will increase her FOTO score to >62 to assist with increased funcitonal capcacity upon discharge (09/23/2014)   Time 6   Status New               Plan - 08/12/14 1544    Clinical Impression Statement Daralyn presents to OPPT with CC of neck pain thats been present for 6-8 months. she demonstrates limited cervical mobility with pain noted most during R and L rotation and sidebending with L>R. all special testing was negative, and shoulder AROM and strenght was [facility]. Palpation revealed signifcant tightness of bil upper trap with siginificant spasm noted with R. Posture exhibits forward head posture and ant rolled shoulders. she would benefit from skilled physical therapy to maximize her function by addressing the impairments listed.    Pt will benefit from skilled therapeutic intervention in order to improve on the following deficits Decreased activity tolerance;Pain;Improper body mechanics;Postural dysfunction;Impaired flexibility;Decreased range of motion;Increased fascial restricitons;Increased muscle spasms;Impaired UE functional use;Decreased endurance   Rehab Potential Good   PT  Frequency 2x / week   PT Duration 6 weeks   PT Treatment/Interventions ADLs/Self Care Home Management;Cryotherapy;Air traffic controller;Therapeutic activities;Therapeutic exercise;Neuromuscular re-education;Patient/family education;Manual techniques;Dry needling;Taping   PT Next Visit Plan assess response to HEP, manual for upper trap tightness, cervical mobility, posture education/training   PT Home Exercise Plan see HEP handout   Consulted and Agree with Plan of Care Patient         Problem List Patient Active Problem List   Diagnosis Date Noted  . DDD (degenerative disc disease), cervical 07/13/2014  . Right knee DJD 11/10/2013  . History of breast cancer 05/25/2013  . Spinal stenosis of lumbar region with radiculopathy 05/31/2011  . Piriformis syndrome of right side 05/09/2011  . Lordosis of lumbar region 05/09/2011  . Knee pain 08/08/2010   Starr Lake PT, DPT, LAT, ATC  08/12/2014  5:39 PM   [facility] [address] [place], [place], [address] Phone: [phone]   Fax:  [phone]                PHYSICAL THERAPY DISCHARGE SUMMARY  Visits from Start of Care: 1  Current functional level related to goals / functional outcomes: FOTO 49% limited   Remaining deficits: See goals   Education / Equipment: HEP handout  Plan: Patient agrees to discharge.  Patient goals were not met. Patient is being discharged due to the patient's request.  ????? self discharge, due to getting in sooner at a different PT location.          Kapri Nero PT, DPT, LAT, ATC  09/10/2014  12:11 PM

## 2014-08-18 ENCOUNTER — Other Ambulatory Visit: Payer: BC Managed Care – PPO | Admitting: Sports Medicine

## 2014-08-23 ENCOUNTER — Other Ambulatory Visit: Payer: Self-pay | Admitting: Family Medicine

## 2014-08-23 ENCOUNTER — Ambulatory Visit
Admission: RE | Admit: 2014-08-23 | Discharge: 2014-08-23 | Disposition: A | Discharge status: Home / Self Care | Payer: BC Managed Care – PPO | Source: Ambulatory Visit | Attending: Family Medicine | Admitting: Family Medicine

## 2014-08-23 DIAGNOSIS — T1490XA Injury, unspecified, initial encounter: Secondary | ICD-10-CM

## 2014-08-31 ENCOUNTER — Telehealth: Payer: Self-pay | Admitting: *Deleted

## 2014-08-31 NOTE — Telephone Encounter (Signed)
Dr. Oneida Alar said one change we could make is go up to 75mg  on the amitriptyline and do 1 pill every 3 to 4 hours on the tramadol. He said If this change doesn't help at all we will need to get you back in to reevaluate.  Pt states she is game for the tramadol change but she thinks she will stick with the current does of amitriptyline and call us if things get worse.

## 2014-09-02 ENCOUNTER — Encounter: Payer: BC Managed Care – PPO | Admitting: Physical Therapy

## 2014-09-03 ENCOUNTER — Other Ambulatory Visit: Payer: Self-pay | Admitting: *Deleted

## 2014-09-03 MED ORDER — AMITRIPTYLINE HCL 75 MG PO TABS: 75.0000 mg | ORAL_TABLET | Freq: Every evening | ORAL | Status: DC | PRN

## 2014-09-13 ENCOUNTER — Encounter: Payer: BC Managed Care – PPO | Admitting: Physical Therapy

## 2014-09-14 ENCOUNTER — Ambulatory Visit (INDEPENDENT_AMBULATORY_CARE_PROVIDER_SITE_OTHER): Payer: BC Managed Care – PPO | Admitting: Sports Medicine

## 2014-09-14 ENCOUNTER — Encounter: Payer: Self-pay | Admitting: Sports Medicine

## 2014-09-14 VITALS — BP 137/80 | Ht 65.0 in | Wt 140.0 lb

## 2014-09-14 DIAGNOSIS — M5030 Other cervical disc degeneration, unspecified cervical region: Secondary | ICD-10-CM | POA: Diagnosis not present

## 2014-09-14 NOTE — Progress Notes (Signed)
  Nathan Stallworth Corman - 64 y.o. female MRN [id number]  Date of birth: 07-Jun-1950  SUBJECTIVE:  Including CC & ROS.   Patient presents for follow up of cervical spinal stenosis. She reports today that she has been experiencing more pain on the left arm. Pain is shooting and electric from her neck down to the thumb and index finger. She does not have any weakness. She has been taking tramadol 4 times a day without relief, and does take amitriptyline at night (but says mostly for sleeping) but doesn't think this helps much either. She sleeps usually on her side, varies between left and right side but no matter the position one of the sides gets pain. She is wondering if this will ever be getting better but would like to try and stick with conservative measures. She went to one visit of PT but the stretches were very painful and the next follow up they could do was 5-6 weeks so she has decided not to go back. (Gabapentin triggered migraine)  The intense pain and neck spasms have resolved.   HISTORY: Past Medical, Surgical, Social, and Family History Reviewed & Updated per EMR. Pertinent Historical Findings include: Cervical DDD: Anterior slip C3-4 and C4-5, posterior slip C5-6. Facet degeneration. Right foraminal encroachment C5-6 Lumbar lordosis and spinal stenosis  Recent RT radial fracture - pulled by dog.  Dr Burney Gauze treating.  DATA REVIEWED: MRI c-spine wo contrast 07/29/2014  PHYSICAL EXAM:  VS: BP:137/80 mmHg  HR: bpm  TEMP: ( )  RESP:   HT:5\' 5"  (165.1 cm)   WT:140 lb (63.504 kg)  BMI:23.3 PHYSICAL EXAM: General: NAD sitting upright on exam table Neuro: alert and oriented x 3, strength testing 5/5 for grip, finger abduction/adduction, wrist flexion/extension, arm flexion/extension. Comparison strength testing for right hand limited due to recent fracture/wrist splint on. 2+ biceps, triceps, brachioradialis reflexes. Sensation testing is normal for the left hand and fingers. MSK: Neck:  Improved ROM with flexion and extension, rotation all appear largely intact, still having some limitation of lateral bending but can do about 45 degrees on both sides.  Posture is improved  ASSESSMENT & PLAN: See problem based charting & AVS for pt instructions.

## 2014-09-14 NOTE — Patient Instructions (Signed)
Vitamin B6 - take 50 to 100mg  a day  Amitriptyline - take 50mg  a day  Stop the tramadol, start taking ibuprofen (up to 600mg  per dose, 3 times a day).  Work on easy range of motion of the neck.

## 2014-09-14 NOTE — Assessment & Plan Note (Addendum)
Significant improvement in neck range of motion and pain. She is at around week 6 since this started. No improvement with tramadol so will stop this, encourage more use of ibuprofen. She did not do as well with the 75mg  amitriptyline, so will recommend going back to 50mg . Continue Vit B6. Easy range of motion exercises of the neck. Follow up around 6 weeks/ sooner if not improving.

## 2014-09-15 ENCOUNTER — Encounter: Payer: BC Managed Care – PPO | Admitting: Physical Therapy

## 2014-09-20 ENCOUNTER — Encounter: Payer: BC Managed Care – PPO | Admitting: Physical Therapy

## 2014-09-22 ENCOUNTER — Encounter: Payer: BC Managed Care – PPO | Admitting: Physical Therapy

## 2014-09-27 ENCOUNTER — Encounter: Payer: BC Managed Care – PPO | Admitting: Physical Therapy

## 2014-09-29 ENCOUNTER — Encounter: Payer: BC Managed Care – PPO | Admitting: Physical Therapy

## 2014-10-05 ENCOUNTER — Encounter: Payer: BC Managed Care – PPO | Admitting: Physical Therapy

## 2014-11-09 ENCOUNTER — Other Ambulatory Visit: Payer: Self-pay

## 2014-11-09 DIAGNOSIS — Z1231 Encounter for screening mammogram for malignant neoplasm of breast: Secondary | ICD-10-CM

## 2014-12-15 ENCOUNTER — Ambulatory Visit: Payer: BC Managed Care – PPO

## 2014-12-20 ENCOUNTER — Ambulatory Visit: Payer: BC Managed Care – PPO | Admitting: Certified Nurse Midwife

## 2014-12-23 ENCOUNTER — Ambulatory Visit: Payer: BC Managed Care – PPO | Admitting: Certified Nurse Midwife

## 2014-12-28 ENCOUNTER — Telehealth: Payer: Self-pay | Admitting: Certified Nurse Midwife

## 2014-12-28 NOTE — Telephone Encounter (Signed)
Left message to reschedule AEX appointment for 01/12/2015 with Ms. Debbie.

## 2015-01-06 ENCOUNTER — Ambulatory Visit
Admission: RE | Admit: 2015-01-06 | Discharge: 2015-01-06 | Disposition: A | Discharge status: Home / Self Care | Payer: BC Managed Care – PPO | Source: Ambulatory Visit

## 2015-01-06 DIAGNOSIS — Z1231 Encounter for screening mammogram for malignant neoplasm of breast: Secondary | ICD-10-CM

## 2015-01-12 ENCOUNTER — Ambulatory Visit: Payer: Self-pay | Admitting: Certified Nurse Midwife

## 2015-01-18 ENCOUNTER — Encounter: Payer: Self-pay | Admitting: Certified Nurse Midwife

## 2015-01-18 ENCOUNTER — Ambulatory Visit (INDEPENDENT_AMBULATORY_CARE_PROVIDER_SITE_OTHER): Payer: BC Managed Care – PPO | Admitting: Certified Nurse Midwife

## 2015-01-18 VITALS — BP 110/64 | HR 68 | Resp 16 | Ht 65.25 in | Wt 144.0 lb

## 2015-01-18 DIAGNOSIS — Z01419 Encounter for gynecological examination (general) (routine) without abnormal findings: Secondary | ICD-10-CM

## 2015-01-18 DIAGNOSIS — N952 Postmenopausal atrophic vaginitis: Secondary | ICD-10-CM

## 2015-01-18 NOTE — Patient Instructions (Signed)

## 2015-01-18 NOTE — Progress Notes (Signed)
Reviewed personally.  M. Suzanne Maxfield Gildersleeve, MD.  

## 2015-01-18 NOTE — Progress Notes (Signed)
64 y.o. G68P2002 Married  Caucasian Fe here for annual exam. Menopausal no vaginal bleeding or vaginal dryness. Sees PCP yearly for labs/aex/ medication management of cholesterol/thyroid. Starting on Prolia per PCP osteoporosis. Patient fractured right arm in 6/16, healed well. Still having vaginal dryness and has not worried about it. Not sexually active.  No health issues today. Daughter a Paramedic in college now!  Patient's last menstrual period was 03/05/1997.          Sexually active: No.  The current method of family planning is status post hysterectomy.    Exercising: Yes.    cycle Smoker:  no  Health Maintenance: Pap: 8/11 neg MMG: 01-06-15 category b density,birads 1:neg Colonoscopy:  2004 BMD:   2015 osteoporosis TDaP: 2014, has pneumonia, shingles also Labs: pcp Self breast exam: done occ   reports that she has never smoked. She has never used smokeless tobacco. She reports that she drinks about 7.0 oz of alcohol per week. She reports that she does not use illicit drugs.  Past Medical History  Diagnosis Date  . Breast cancer ([place]) 11/1997    RIGHT  . Thyroid disease     hypothyroid  . Hypercholesteremia   . Depression   . Anxiety     Past Surgical History  Procedure Laterality Date  . Vaginal hysterectomy  12/03    LAVH-BSO  . Breast lumpectomy Right 11/1997    Current Outpatient Prescriptions  Medication Sig Dispense Refill  . ALPRAZolam (XANAX) 0.5 MG tablet TAKE 1/2 TO 1 TABLET TWICE A DAY AS NEEDED  0  . Cholecalciferol (VITAMIN D PO) Take by mouth daily.    . Coenzyme Q10 (CO Q 10 PO) Take by mouth daily.    Marland Kitchen denosumab (PROLIA) 60 MG/ML SOLN injection Inject 60 mg into the skin every 6 (six) months. Administer in upper arm, thigh, or abdomen    . levothyroxine (SYNTHROID, LEVOTHROID) 75 MCG tablet     . Multiple Vitamins-Minerals (MULTIVITAMIN PO) Take by mouth daily.    . Omega-3 Fatty Acids (FISH OIL PO) Take by mouth daily.    . simvastatin (ZOCOR) 20 MG  tablet Take 20 mg by mouth every evening.  2  . temazepam (RESTORIL) 15 MG capsule   0  . triamcinolone (KENALOG) 0.1 % paste      No current facility-administered medications for this visit.    Family History  Problem Relation Age of Onset  . Rheum arthritis Mother     ROS:  Pertinent items are noted in HPI.  Otherwise, a comprehensive ROS was negative.  Exam:   BP 110/64 mmHg  Pulse 68  Resp 16  Ht 5' 5.25" (1.657 m)  Wt 144 lb (65.318 kg)  BMI 23.79 kg/m2  LMP 03/05/1997 Height: 5' 5.25" (165.7 cm) Ht Readings from Last 3 Encounters:  01/18/15 5' 5.25" (1.657 m)  09/14/14 5\' 5"  (1.651 m)  08/10/14 5\' 5"  (1.651 m)    General appearance: alert, cooperative and appears stated age Head: Normocephalic, without obvious abnormality, atraumatic Neck: no adenopathy, supple, symmetrical, trachea midline and thyroid normal to inspection and palpation Lungs: clear to auscultation bilaterally Breasts: normal appearance, no masses or tenderness, No nipple retraction or dimpling, No nipple discharge or bleeding, No axillary or supraclavicular adenopathy Heart: regular rate and rhythm Abdomen: soft, non-tender; no masses,  no organomegaly Extremities: extremities normal, atraumatic, no cyanosis or edema Skin: Skin color, texture, turgor normal. No rashes or lesions Lymph nodes: Cervical, supraclavicular, and axillary nodes normal. No abnormal  inguinal nodes palpated Neurologic: Grossly normal   Pelvic: External genitalia:  no lesions              Urethra:  normal appearing urethra with no masses, tenderness or lesions              Bartholin's and Skene's: normal                 Vagina: normal appearing vagina with normal color and discharge, no lesions              Cervix: absent              Pap taken: No. Bimanual Exam:  Uterus:  uterus absent              Adnexa: no mass, fullness, tenderness and adnexa absent               Rectovaginal: Confirms               Anus:  normal  sphincter tone, no lesions  Chaperone present: yes  A:  Well Woman with normal exam  Menopausal no HRT S/P LAVH with BSO history of breast cancer  Osteoporosis/cholesterol/hypothyroid with PCP management  Atrophic Vaginitis  History of Breast cancer, mammogram normal  P:   Reviewed health and wellness pertinent to exam  Aware of need to evaluate if vaginal bleeding  Continue follow up with PCP as indicated  Discussed trying coconut oil with applicator use to help with insertion vaginally. Patient would like to try. Sample new applicators given with instruction will advise if problems Stressed yearly mammogram  Pap smear as above not taken   counseled on breast self exam, mammography screening, menopause, osteoporosis, adequate intake of calcium and vitamin D, diet and exercise  return annually or prn  An After Visit Summary was printed and given to the patient.

## 2015-09-01 ENCOUNTER — Encounter: Payer: Self-pay | Admitting: Certified Nurse Midwife

## 2015-09-01 ENCOUNTER — Ambulatory Visit (INDEPENDENT_AMBULATORY_CARE_PROVIDER_SITE_OTHER): Payer: BC Managed Care – PPO | Admitting: Certified Nurse Midwife

## 2015-09-01 ENCOUNTER — Telehealth: Payer: Self-pay | Admitting: *Deleted

## 2015-09-01 VITALS — BP 118/68 | HR 70 | Resp 16 | Ht 65.25 in | Wt 143.0 lb

## 2015-09-01 DIAGNOSIS — R2231 Localized swelling, mass and lump, right upper limb: Secondary | ICD-10-CM

## 2015-09-01 NOTE — Telephone Encounter (Signed)
Spoke with patient. Patient states that she found a lump in her right axillary area this morning. Patient is concerned due to her history of breast cancer. Reports the area is not tender to the touch. Does not noticed any color change to the area. Advised she will need to be seen in the office for evaluation. She is agreeable and requests to be seen today. Appointment scheduled for today 6/29 at 12:45 pm with Melvia Heaps CNM. She is agreeable to date and time.  Routing to provider for final review. Patient agreeable to disposition. Will close encounter.

## 2015-09-01 NOTE — Progress Notes (Signed)
Subjective:     Patient ID: [name], female [id number]  DOB: [birth year], 65 y.o.   MRN: [id number]  HPI Patient noted small enlarged area under right axilla area. Does regular SBE. Last mammogram which was negative in 01/07/15. Patient is right hand dominant. Had breast cancer on right with lymph node removal. Denies any tenderness, breast skin change or nipple discharge. No redness or tenderness in area of concern, just looked slightly larger. No other health issues today.   Review of Systems  Constitutional: Negative.   Pertinent to HPI     Objective:   Physical Exam  Constitutional: She appears well-developed and well-nourished.  Skin: Skin is warm and dry.  Psychiatric: She has a normal mood and affect. Her behavior is normal.   Right/left breast : no masses, tenderness, skin change or nipple discharge bilateral. Right breast scar noted on URQ.  Axillary on right no enlargement, just tendon insertion palpated with rotation of arm and palpation in different angles, no tenderness or enlarged axillary lymph nodes noted. Left: normal appearance, no enlarged lymph nodes noted. Patient also assessed by Dr. Quincy Simmonds who examined patient and concurs no abnormal area noted.     Assessment:     Normal bilateral breast and axillary exam History of breast cancer on right     Plan:     Discussed finding with patient and reassured. Questions addressed. Patient will advise if change. Continue monthly SBE and mammogram as ordered.  Rv prn

## 2015-09-01 NOTE — Telephone Encounter (Signed)
Patient called very worried about a lump she found this morning in her breast. She is requesting to be seen today. -eh

## 2015-09-02 NOTE — Progress Notes (Signed)
Reviewed personally.  M. Suzanne Riese Hellard, MD.  

## 2015-11-02 ENCOUNTER — Ambulatory Visit (INDEPENDENT_AMBULATORY_CARE_PROVIDER_SITE_OTHER): Payer: BC Managed Care – PPO | Admitting: Licensed Clinical Social Worker

## 2015-11-02 DIAGNOSIS — F419 Anxiety disorder, unspecified: Secondary | ICD-10-CM | POA: Diagnosis not present

## 2015-12-04 HISTORY — PX: NASAL SINUS SURGERY: SHX719

## 2015-12-05 ENCOUNTER — Other Ambulatory Visit: Payer: Self-pay | Admitting: Family Medicine

## 2015-12-05 DIAGNOSIS — Z1231 Encounter for screening mammogram for malignant neoplasm of breast: Secondary | ICD-10-CM

## 2015-12-05 DIAGNOSIS — Z853 Personal history of malignant neoplasm of breast: Secondary | ICD-10-CM

## 2015-12-19 ENCOUNTER — Ambulatory Visit (INDEPENDENT_AMBULATORY_CARE_PROVIDER_SITE_OTHER): Payer: BC Managed Care – PPO | Admitting: Licensed Clinical Social Worker

## 2015-12-19 DIAGNOSIS — F419 Anxiety disorder, unspecified: Secondary | ICD-10-CM | POA: Diagnosis not present

## 2015-12-27 ENCOUNTER — Other Ambulatory Visit: Payer: Self-pay | Admitting: Family Medicine

## 2015-12-27 DIAGNOSIS — J329 Chronic sinusitis, unspecified: Secondary | ICD-10-CM

## 2015-12-28 ENCOUNTER — Ambulatory Visit: Payer: Self-pay | Admitting: Family Medicine

## 2015-12-29 ENCOUNTER — Ambulatory Visit
Admission: RE | Admit: 2015-12-29 | Discharge: 2015-12-29 | Disposition: A | Discharge status: Home / Self Care | Payer: BC Managed Care – PPO | Source: Ambulatory Visit | Attending: Family Medicine | Admitting: Family Medicine

## 2015-12-29 ENCOUNTER — Ambulatory Visit: Payer: BC Managed Care – PPO | Admitting: Allergy & Immunology

## 2015-12-29 DIAGNOSIS — J329 Chronic sinusitis, unspecified: Secondary | ICD-10-CM

## 2016-01-02 NOTE — Progress Notes (Deleted)
[name] [address], [place] [address] Phone: [phone] Subjective:    I'm seeing this patient by the request  of:  SHAW,KIMBERLEE, MD   CC:  Bilateral knee pain    [id number]  [name] is a 65 y.o. female coming in with complaint of bilateral knee pain.     patient's previous imaging includes a bone 2015 of bilateral standing knee. X-ray showed the patient did have moderate to severe medial compartment osteophytic changes. Questionable loose bodies.  Past Medical History:  Diagnosis Date  . Anxiety   . Breast cancer ([place]) 11/1997   RIGHT  . Broken arm 6/16   right  . Depression   . Hypercholesteremia   . Thyroid disease    hypothyroid   Past Surgical History:  Procedure Laterality Date  . BREAST LUMPECTOMY Right 11/1997  . VAGINAL HYSTERECTOMY  12/03   LAVH-BSO   Social History   Social History  . Marital status: Married    Spouse name: N/A  . Number of children: N/A  . Years of education: N/A   Social History Main Topics  . Smoking status: Never Smoker  . Smokeless tobacco: Never Used  . Alcohol use 7.0 oz/week    14 Standard drinks or equivalent per week  . Drug use: No  . Sexual activity: No     Comment: LAVH   Other Topics Concern  . Not on file   Social History Narrative  . No narrative on file   No Known Allergies Family History  Problem Relation Age of Onset  . Rheum arthritis Mother   . Non-Hodgkin's lymphoma Sister     Past medical history, social, surgical and family history all reviewed in electronic medical record.  No pertanent information unless stated regarding to the chief complaint.   Review of Systems: No headache, visual changes, nausea, vomiting, diarrhea, constipation, dizziness, abdominal pain, skin rash, fevers, chills, night sweats, weight loss, swollen lymph nodes, body aches, joint swelling, muscle aches, chest pain, shortness of breath, mood changes.   Objective  Last  menstrual period 03/05/1997.  General: No apparent distress alert and oriented x3 mood and affect normal, dressed appropriately.  HEENT: Pupils equal, extraocular movements intact  Respiratory: Patient's speak in full sentences and does not appear short of breath  Cardiovascular: No lower extremity edema, non tender, no erythema  Skin: Warm dry intact with no signs of infection or rash on extremities or on axial skeleton.  Abdomen: Soft nontender  Neuro: Cranial nerves II through XII are intact, neurovascularly intact in all extremities with 2+ DTRs and 2+ pulses.  Lymph: No lymphadenopathy of posterior or anterior cervical chain or axillae bilaterally.  Gait normal with good balance and coordination.  MSK:  Non tender with full range of motion and good stability and symmetric strength and tone of shoulders, elbows, wrist, hip, and ankles bilaterally.  Knee: Bilateral Normal to inspection with no erythema or effusion or obvious bony abnormalities. Palpation normal with no warmth, joint line tenderness, patellar tenderness, or condyle tenderness. ROM full in flexion and extension and lower leg rotation. Ligaments with solid consistent endpoints including ACL, PCL, LCL, MCL. Negative Mcmurray's, Apley's, and Thessalonian tests. Non painful patellar compression. Patellar glide without crepitus. Patellar and quadriceps tendons unremarkable. Hamstring and quadriceps strength is normal.     Impression and Recommendations:     This case required medical decision making of moderate complexity.      Note: This dictation was  prepared with Dragon dictation along with smaller phrase technology. Any transcriptional errors that result from this process are unintentional.

## 2016-01-03 ENCOUNTER — Ambulatory Visit: Payer: BC Managed Care – PPO | Admitting: Family Medicine

## 2016-01-05 ENCOUNTER — Ambulatory Visit: Payer: BC Managed Care – PPO | Admitting: Allergy & Immunology

## 2016-01-09 ENCOUNTER — Ambulatory Visit: Payer: BC Managed Care – PPO | Admitting: Allergy and Immunology

## 2016-01-18 ENCOUNTER — Ambulatory Visit
Admission: RE | Admit: 2016-01-18 | Discharge: 2016-01-18 | Disposition: A | Discharge status: Home / Self Care | Payer: BC Managed Care – PPO | Source: Ambulatory Visit | Attending: Family Medicine | Admitting: Family Medicine

## 2016-01-18 DIAGNOSIS — Z1231 Encounter for screening mammogram for malignant neoplasm of breast: Secondary | ICD-10-CM

## 2016-01-18 DIAGNOSIS — Z853 Personal history of malignant neoplasm of breast: Secondary | ICD-10-CM

## 2016-01-19 ENCOUNTER — Ambulatory Visit: Payer: BC Managed Care – PPO | Admitting: Certified Nurse Midwife

## 2016-01-23 ENCOUNTER — Ambulatory Visit: Payer: BC Managed Care – PPO | Admitting: Licensed Clinical Social Worker

## 2016-02-08 ENCOUNTER — Other Ambulatory Visit: Payer: Self-pay | Admitting: Otolaryngology

## 2016-03-22 ENCOUNTER — Telehealth: Payer: Self-pay | Admitting: Certified Nurse Midwife

## 2016-03-22 ENCOUNTER — Ambulatory Visit: Payer: BC Managed Care – PPO | Admitting: Certified Nurse Midwife

## 2016-03-22 NOTE — Telephone Encounter (Signed)
Left patient a message to call back to reschedule an appointment for her AEX today that was cancelled by the provider due to inclement weather.

## 2016-03-29 ENCOUNTER — Ambulatory Visit (INDEPENDENT_AMBULATORY_CARE_PROVIDER_SITE_OTHER): Payer: BC Managed Care – PPO | Admitting: Family Medicine

## 2016-03-29 ENCOUNTER — Encounter: Payer: Self-pay | Admitting: Family Medicine

## 2016-03-29 DIAGNOSIS — M170 Bilateral primary osteoarthritis of knee: Secondary | ICD-10-CM | POA: Diagnosis not present

## 2016-03-29 MED ORDER — VITAMIN D (ERGOCALCIFEROL) 1.25 MG (50000 UNIT) PO CAPS: 50000.0000 [IU] | ORAL_CAPSULE | ORAL | 0 refills | Status: DC

## 2016-03-29 NOTE — Assessment & Plan Note (Signed)
Patient does have severe knee arthritis. Valgus deformity noted. Discussed with patient at great length. We discussed different treatment options. Patient is artery done a lot of the conservative therapy. Patient declined any type of injection or formal physical therapy. Work with Product/process development scientist to learn home exercises. Trial of topical anti-inflammatory given. Discussed icing protocol. Once weekly vitamin D given with patient having a history of osteopenia. Patient will try these interventions and come back in 4 weeks. Worsening symptoms we'll consider injection.

## 2016-03-29 NOTE — Progress Notes (Signed)
[name] [address] [place], [place] [address] Phone: [phone] Subjective:    I'm seeing this patient by the request  of:  SHAW,KIMBERLEE, MD   CC:  Bilateral knee pain  [id number]  [name] is a 66 y.o. female coming in with complaint of bilateral knee pain. Has had it for years. Seems to be worsening recently. Discussed the pain as a dull, throbbing aching sensation. More on the anterior and medial aspects of the knee. Patient's does not rib or any true injury. Patient is a cyclist and states that the pain in the knees is even starting to stop her from this. Past medical history is significant for osteoporosis. Seem to get significantly worse when she stopped her statin medications he states as well. Denies any instability but states that sometimes she does not do activities because she is concerned her knees may give out.   patient and x-rays in 2015. These were independently visualized by me in bilateral standing knee x-ray showed patient having severe medial compartment arthritic changes as well as with potential loose bodies.  Past Medical History:  Diagnosis Date  . Anxiety   . Breast cancer ([place]) 11/1997   RIGHT  . Broken arm 6/16   right  . Depression   . Hypercholesteremia   . Thyroid disease    hypothyroid   Past Surgical History:  Procedure Laterality Date  . BREAST LUMPECTOMY Right 11/1997  . VAGINAL HYSTERECTOMY  12/03   LAVH-BSO   Social History   Social History  . Marital status: Married    Spouse name: N/A  . Number of children: N/A  . Years of education: N/A   Social History Main Topics  . Smoking status: Never Smoker  . Smokeless tobacco: Never Used  . Alcohol use 7.0 oz/week    14 Standard drinks or equivalent per week  . Drug use: No  . Sexual activity: No     Comment: LAVH   Other Topics Concern  . None   Social History Narrative  . None   No Known Allergies Family History  Problem Relation Age  of Onset  . Rheum arthritis Mother   . Non-Hodgkin's lymphoma Sister     Past medical history, social, surgical and family history all reviewed in electronic medical record.  No pertanent information unless stated regarding to the chief complaint.   Review of Systems:Review of systems updated and as accurate as of 03/29/16  No headache, visual changes, nausea, vomiting, diarrhea, constipation, dizziness, abdominal pain, skin rash, fevers, chills, night sweats, weight loss, swollen lymph nodes, body aches, chest pain, shortness of breath, mood changes.   Objective  Blood pressure 138/86, pulse 69, height 5\' 5"  (1.651 m), weight 144 lb (65.3 kg), last menstrual period 03/05/1997, SpO2 99 %. Systems examined below as of 03/29/16   General: No apparent distress alert and oriented x3 mood and affect normal, dressed appropriately.  HEENT: Pupils equal, extraocular movements intact  Respiratory: Patient's speak in full sentences and does not appear short of breath  Cardiovascular: No lower extremity edema, non tender, no erythema  Skin: Warm dry intact with no signs of infection or rash on extremities or on axial skeleton.  Abdomen: Soft nontender  Neuro: Cranial nerves II through XII are intact, neurovascularly intact in all extremities with 2+ DTRs and 2+ pulses.  Lymph: No lymphadenopathy of posterior or anterior cervical chain or axillae bilaterally.  Gait normal with good balance and coordination.  MSK:  Non tender with full range of motion and good stability and symmetric strength and tone of shoulders, elbows, wrist, hip and ankles bilaterally.  Knee:bilateral. Normal to inspection with no erythema or effusion or obvious bony abnormalities.  Tender to palpation over the medial joint line ROM full in flexion and extension and lower leg rotation. Instability noted with valgus force. Negative Mcmurray's, Apley's, and Thessalonian tests. Non painful patellar compression. Patellar glide  with moderate crepitus. Patellar and quadriceps tendons unremarkable. Hamstring and quadriceps strength is normal.   Procedure note [id number]; 15 minutes spent for Therapeutic exercises as stated in above notes.  This included exercises focusing on stretching, strengthening, with significant focus on eccentric aspects.   Flexion and extension exercises working on the vastus medialis oblique and hip abductor strengthening. Proper technique shown and discussed handout in great detail with ATC.  All questions were discussed and answered.     Impression and Recommendations:     This case required medical decision making of moderate complexity.      Note: This dictation was prepared with Dragon dictation along with smaller phrase technology. Any transcriptional errors that result from this process are unintentional.

## 2016-03-29 NOTE — Patient Instructions (Signed)
Good to see you.  Ice 20 minutes 2 times daily. Usually after activity and before bed. Exercises 3 times a week.  pennsaid pinkie amount topically 2 times daily as needed.  Once weekly vitamin D for 12 weeks.  I think you have tried a lot of good over the counter medicine already  Make sure CoQ10 is 400mg  daily  Good shoes with rigid bottom.  [name], [place]rell or New balance greater then 700 See me again in 4 weeks.  If not better lets consider injections.

## 2016-04-04 ENCOUNTER — Telehealth: Payer: Self-pay | Admitting: Family Medicine

## 2016-04-04 NOTE — Telephone Encounter (Signed)
Pt called and said she left message about samples of pennsaid yesterday, she is wondering if she can get a RX for it, please call pt back, MS

## 2016-04-04 NOTE — Telephone Encounter (Signed)
lmovm making pt aware that we can't send pennsaid to her local pharmacy because insurance does not cover it. [facility] & [facility] can [facility] it to her if she prefers.

## 2016-04-05 MED ORDER — DICLOFENAC SODIUM 1 % TD GEL: 2.0000 g | Freq: Two times a day (BID) | TRANSDERMAL | 1 refills | Status: DC

## 2016-04-05 NOTE — Telephone Encounter (Signed)
Refill done.  

## 2016-04-05 NOTE — Addendum Note (Signed)
Addended by: Douglass Rivers T on: 04/05/2016 04:16 PM   Modules accepted: Orders

## 2016-04-05 NOTE — Telephone Encounter (Signed)
Pt stated she would go with [facility] and send it to her local pharmacy. Thanks.

## 2016-04-16 ENCOUNTER — Telehealth: Payer: Self-pay | Admitting: Emergency Medicine

## 2016-04-16 NOTE — Telephone Encounter (Signed)
Pt called and has some questions about her vitamin D. She is wondering how much to be taking. Please give her a call back. Thanks.

## 2016-04-17 NOTE — Telephone Encounter (Signed)
lmovm for pt to return call.  

## 2016-04-19 NOTE — Telephone Encounter (Signed)
lmovm for pt to return call.  

## 2016-04-20 ENCOUNTER — Encounter: Payer: Self-pay | Admitting: Family Medicine

## 2016-04-24 ENCOUNTER — Encounter: Payer: Self-pay | Admitting: Family Medicine

## 2016-04-24 ENCOUNTER — Ambulatory Visit (INDEPENDENT_AMBULATORY_CARE_PROVIDER_SITE_OTHER): Payer: BC Managed Care – PPO | Admitting: Family Medicine

## 2016-04-24 DIAGNOSIS — M170 Bilateral primary osteoarthritis of knee: Secondary | ICD-10-CM | POA: Diagnosis not present

## 2016-04-24 NOTE — Assessment & Plan Note (Addendum)
She was having worsening symptoms. Bilateral injections given today. Tolerated the procedure well. We discussed icing regimen and home exercises. We discussed which activities to do in which ones to avoid. Patient's will come back and see me again in 4 weeks. At that time we'll consider physical therapy as well as viscous supplementation if no improvement.

## 2016-04-24 NOTE — Patient Instructions (Signed)
Good  See you  Ice is your friend You know the drill Continue the bike See me again in 4 weeks.

## 2016-04-24 NOTE — Progress Notes (Signed)
[name] [place], [place] [address] Phone: [phone] Subjective:    I'm seeing this patient by the request  of:  SHAW,KIMBERLEE, MD   CC:  Bilateral knee pain  [id number]  [name] is a 66 y.o. female coming in with complaint of bilateral knee pain. Has had it for years.  Patient saw me and did have significant amount of arthritis of the knees. Patient was to start trying conservative therapy including home exercises, icing, biking. Patient states that she has not had any significant improvement.  Instability noted.    patient and x-rays in 2015. These were independently visualized by me in bilateral standing knee x-ray showed patient having severe medial compartment arthritic changes as well as with potential loose bodies.  Past Medical History:  Diagnosis Date  . Anxiety   . Breast cancer ([place]) 11/1997   RIGHT  . Broken arm 6/16   right  . Depression   . Hypercholesteremia   . Thyroid disease    hypothyroid   Past Surgical History:  Procedure Laterality Date  . BREAST LUMPECTOMY Right 11/1997  . VAGINAL HYSTERECTOMY  12/03   LAVH-BSO   Social History   Social History  . Marital status: Married    Spouse name: N/A  . Number of children: N/A  . Years of education: N/A   Social History Main Topics  . Smoking status: Never Smoker  . Smokeless tobacco: Never Used  . Alcohol use 7.0 oz/week    14 Standard drinks or equivalent per week  . Drug use: No  . Sexual activity: No     Comment: LAVH   Other Topics Concern  . None   Social History Narrative  . None   No Known Allergies Family History  Problem Relation Age of Onset  . Rheum arthritis Mother   . Non-Hodgkin's lymphoma Sister     Past medical history, social, surgical and family history all reviewed in electronic medical record.  No pertanent information unless stated regarding to the chief complaint.   Review of Systems: No headache, visual  changes, nausea, vomiting, diarrhea, constipation, dizziness, abdominal pain, skin rash, fevers, chills, night sweats, weight loss, swollen lymph nodes, body aches,  chest pain, shortness of breath, mood changes. Positive muscle aches and joint swelling  Objective  Blood pressure 140/80, pulse 78, height 5\' 5"  (1.651 m), weight 144 lb (65.3 kg), last menstrual period 03/05/1997.   Systems examined below as of 04/24/16 General: NAD A&O x3 mood, affect normal  HEENT: Pupils equal, extraocular movements intact no nystagmus Respiratory: not short of breath at rest or with speaking Cardiovascular: No lower extremity edema, non tender Skin: Warm dry intact with no signs of infection or rash on extremities or on axial skeleton. Abdomen: Soft nontender, no masses Neuro: Cranial nerves  intact, neurovascularly intact in all extremities with 2+ DTRs and 2+ pulses. Lymph: No lymphadenopathy appreciated today  Gait normal with good balance and coordination.  MSK: Non tender with full range of motion and good stability and symmetric strength and tone of shoulders, elbows, wrist,  hips and ankles bilaterally.   Knee: Bilaterally valgus deformity noted. Large thigh to calf ratio.  Tender to palpation over medial and PF joint line.  ROM full in flexion and extension and lower leg rotation. instability with valgus force.  painful patellar compression. Patellar glide with moderate crepitus. Patellar and quadriceps tendons unremarkable. Hamstring and quadriceps strength is normal.  After informed written and  verbal consent, patient was seated on exam table. Right knee was prepped with alcohol swab and utilizing anterolateral approach, patient's right knee space was injected with 4:1  marcaine 0.5%: Kenalog 40mg /dL. Patient tolerated the procedure well without immediate complications.  After informed written and verbal consent, patient was seated on exam table. Left knee was prepped with alcohol swab and  utilizing anterolateral approach, patient's left knee space was injected with 4:1  marcaine 0.5%: Kenalog 40mg /dL. Patient tolerated the procedure well without immediate complications.    Impression and Recommendations:     This case required medical decision making of moderate complexity.      Note: This dictation was prepared with Dragon dictation along with smaller phrase technology. Any transcriptional errors that result from this process are unintentional.

## 2016-04-25 ENCOUNTER — Encounter: Payer: Self-pay | Admitting: Family Medicine

## 2016-05-23 ENCOUNTER — Encounter: Payer: Self-pay | Admitting: Family Medicine

## 2016-05-23 ENCOUNTER — Ambulatory Visit (INDEPENDENT_AMBULATORY_CARE_PROVIDER_SITE_OTHER): Payer: BC Managed Care – PPO | Admitting: Family Medicine

## 2016-05-23 DIAGNOSIS — M170 Bilateral primary osteoarthritis of knee: Secondary | ICD-10-CM | POA: Diagnosis not present

## 2016-05-23 MED ORDER — DICLOFENAC SODIUM 2 % TD SOLN: 2.0000 g | Freq: Two times a day (BID) | TRANSDERMAL | 3 refills | Status: DC

## 2016-05-23 NOTE — Assessment & Plan Note (Signed)
Doing well with conservative therapy now to the injections. We discussed with patient about the possibility of viscous supplementation. Patient will consider checking with her insurance company. As long as patient does well we can repeat injections every 3 months. Patient will follow-up with me again in 6 weeks to make sure patient is continuing to respond.

## 2016-05-23 NOTE — Patient Instructions (Addendum)
Good to see you  Ice 20 minutes 2 times daily. Usually after activity and before bed. You are doing great  pennsaid pinkie amount topically 2 times daily as needed.  Iron 65mg  with 500mg  of vitamin C daily can help with leg cramps at night If constipation occurs go to 3 times a week.  See me again in 6 weeks and we still can consider synvisc.  You may want to call your insurance to make sure.  T9810 for the medicine and dx code M17.0, M17.9

## 2016-05-23 NOTE — Progress Notes (Signed)
[name] [place] [place], [place] [address] Phone: [phone] Subjective:    I'm seeing this patient by the request  of:  SHAW,KIMBERLEE, MD   CC:  Bilateral knee pain f/u  [id number]  Nkenge R Guia is a 66 y.o. female coming in with complaint of bilateral knee pain. Has had it for years.  Patient saw me and did have bilateral knee pain. Patient was given injections bilaterally and was going to consider viscous supplementation. Patient states that she is feeling significantly better at this time. States that it is 90% better. No swelling, able to go up and down stairs better than she has not been about 10 years.   patient and x-rays in 2015. These were independently visualized by me in bilateral standing knee x-ray showed patient having severe medial compartment arthritic changes as well as with potential loose bodies.  Past Medical History:  Diagnosis Date  . Anxiety   . Breast cancer ([place]) 11/1997   RIGHT  . Broken arm 6/16   right  . Depression   . Hypercholesteremia   . Thyroid disease    hypothyroid   Past Surgical History:  Procedure Laterality Date  . BREAST LUMPECTOMY Right 11/1997  . VAGINAL HYSTERECTOMY  12/03   LAVH-BSO   Social History   Social History  . Marital status: Married    Spouse name: N/A  . Number of children: N/A  . Years of education: N/A   Social History Main Topics  . Smoking status: Never Smoker  . Smokeless tobacco: Never Used  . Alcohol use 7.0 oz/week    14 Standard drinks or equivalent per week  . Drug use: No  . Sexual activity: No     Comment: LAVH   Other Topics Concern  . None   Social History Narrative  . None   No Known Allergies Family History  Problem Relation Age of Onset  . Rheum arthritis Mother   . Non-Hodgkin's lymphoma Sister     Past medical history, social, surgical and family history all reviewed in electronic medical record.  No pertanent information unless stated  regarding to the chief complaint.  Review of Systems: No headache, visual changes, nausea, vomiting, diarrhea, constipation, dizziness, abdominal pain, skin rash, fevers, chills, night sweats, weight loss, swollen lymph nodes, body aches, joint swelling,chest pain, shortness of breath, mood changes.  On the muscle aches   Objective  Blood pressure (!) 168/100, pulse 72, height 5\' 5"  (1.651 m), weight 145 lb 3.2 oz (65.9 kg), last menstrual period 03/05/1997, SpO2 99 %.   Systems examined below as of 05/23/16 General: NAD A&O x3 mood, affect normal  HEENT: Pupils equal, extraocular movements intact no nystagmus Respiratory: not short of breath at rest or with speaking Cardiovascular: No lower extremity edema, non tender Skin: Warm dry intact with no signs of infection or rash on extremities or on axial skeleton. Abdomen: Soft nontender, no masses Neuro: Cranial nerves  intact, neurovascularly intact in all extremities with 2+ DTRs and 2+ pulses. Lymph: No lymphadenopathy appreciated today  Gait normal with good balance and coordination.  MSK: Non tender with full range of motion and good stability and symmetric strength and tone of shoulders, elbows, wrist, hips and ankles bilaterally.    Arthritic changes of Knee: Bilateral valgus deformity noted. Large thigh to calf ratio.  Normal tenderness still remaining ROM full in flexion and extension and lower leg rotation. instability with valgus force.  painful patellar compression. Patellar  glide with mild crepitus. Patellar and quadriceps tendons unremarkable. Hamstring and quadriceps strength is normal. Improvement from previous exam.     Impression and Recommendations:     This case required medical decision making of moderate complexity.      Note: This dictation was prepared with Dragon dictation along with smaller phrase technology. Any transcriptional errors that result from this process are unintentional.

## 2016-06-07 ENCOUNTER — Encounter: Payer: Self-pay | Admitting: Family Medicine

## 2016-06-15 ENCOUNTER — Encounter: Payer: Self-pay | Admitting: Certified Nurse Midwife

## 2016-06-15 ENCOUNTER — Ambulatory Visit (INDEPENDENT_AMBULATORY_CARE_PROVIDER_SITE_OTHER): Payer: BC Managed Care – PPO | Admitting: Certified Nurse Midwife

## 2016-06-15 VITALS — BP 122/78 | HR 70 | Resp 16 | Ht 65.0 in | Wt 142.0 lb

## 2016-06-15 DIAGNOSIS — N952 Postmenopausal atrophic vaginitis: Secondary | ICD-10-CM | POA: Diagnosis not present

## 2016-06-15 DIAGNOSIS — Z01419 Encounter for gynecological examination (general) (routine) without abnormal findings: Secondary | ICD-10-CM | POA: Diagnosis not present

## 2016-06-15 DIAGNOSIS — N951 Menopausal and female climacteric states: Secondary | ICD-10-CM | POA: Diagnosis not present

## 2016-06-15 NOTE — Patient Instructions (Signed)

## 2016-06-15 NOTE — Progress Notes (Signed)
66 y.o. G38P2002 Married  Caucasian Fe here for annual exam. Menopausal no HRT. Denies vaginal bleeding or vaginal dryness. Noted left buttock ingrown hair, used Monkee Butt and resolved. Currently having problems with lower leg cramping. Working with PCP regarding who also manages her hypothyroid, cholesterol and anxiety medication. No other health issues today.  Patient's last menstrual period was 03/05/1997.          Sexually active: No.  The current method of family planning is status post hysterectomy.    Exercising: Yes.    cycling Smoker:  no  Health Maintenance: Pap:  8/11 neg MMG:  01-18-16 category b density birads 1:neg Colonoscopy:  2004 BMD:  2015 osteoporosis TDaP:  2014 Shingles: had done Pneumonia: had done Hep C and HIV: not done Labs: non Self breast exam: done monthly   reports that she has never smoked. She has never used smokeless tobacco. She reports that she drinks about 3.6 - 4.8 oz of alcohol per week . She reports that she does not use drugs.  Past Medical History:  Diagnosis Date  . Anxiety   . Breast cancer ([place]) 11/1997   RIGHT  . Broken arm 6/16   right  . Depression   . Hypercholesteremia   . Osteoarthritis   . Thyroid disease    hypothyroid    Past Surgical History:  Procedure Laterality Date  . BREAST LUMPECTOMY Right 11/1997  . MOUTH SURGERY    . NASAL SINUS SURGERY    . VAGINAL HYSTERECTOMY  12/03   LAVH-BSO    Current Outpatient Prescriptions  Medication Sig Dispense Refill  . ALPRAZolam (XANAX) 0.5 MG tablet TAKE 1/2 TO 1 TABLET TWICE A DAY AS NEEDED  0  . Coenzyme Q10 (CO Q 10 PO) Take by mouth daily.    Marland Kitchen denosumab (PROLIA) 60 MG/ML SOLN injection Inject 60 mg into the skin every 6 (six) months. Administer in upper arm, thigh, or abdomen    . Diclofenac Sodium (PENNSAID TD) Place onto the skin.    Marland Kitchen escitalopram (LEXAPRO) 10 MG tablet     . levothyroxine (SYNTHROID, LEVOTHROID) 75 MCG tablet     . Multiple Vitamins-Minerals  (MULTIVITAMIN PO) Take by mouth daily.    . Omega-3 Fatty Acids (FISH OIL PO) Take by mouth daily.    . simvastatin (ZOCOR) 20 MG tablet Take 20 mg by mouth every evening.  2  . temazepam (RESTORIL) 15 MG capsule   0  . Vitamin D, Ergocalciferol, (DRISDOL) 50000 units CAPS capsule Take 50,000 Units by mouth every 7 (seven) days.     No current facility-administered medications for this visit.     Family History  Problem Relation Age of Onset  . Rheum arthritis Mother   . Non-Hodgkin's lymphoma Sister     ROS:  Pertinent items are noted in HPI.  Otherwise, a comprehensive ROS was negative.  Exam:   BP 122/78   Pulse 70   Resp 16   Ht 5\' 5"  (1.651 m)   Wt 142 lb (64.4 kg)   LMP 03/05/1997   BMI 23.63 kg/m  Height: 5\' 5"  (165.1 cm) Ht Readings from Last 3 Encounters:  06/15/16 5\' 5"  (1.651 m)  05/23/16 5\' 5"  (1.651 m)  04/24/16 5\' 5"  (1.651 m)    General appearance: alert, cooperative and appears stated age Head: Normocephalic, without obvious abnormality, atraumatic Neck: no adenopathy, supple, symmetrical, trachea midline and thyroid normal to inspection and palpation Lungs: clear to auscultation bilaterally Breasts: normal appearance, no  masses or tenderness, No nipple retraction or dimpling, No nipple discharge or bleeding, No axillary or supraclavicular adenopathy Heart: regular rate and rhythm Abdomen: soft, non-tender; no masses,  no organomegaly Extremities: extremities normal, atraumatic, no cyanosis or edema Skin: Skin color, texture, turgor normal. No rashes or lesions Lymph nodes: Cervical, supraclavicular, and axillary nodes normal. No abnormal inguinal nodes palpated Neurologic: Grossly normal   Pelvic: External genitalia:  no lesions              Urethra:  normal appearing urethra with no masses, tenderness or lesions              Bartholin's and Skene's: normal                 Vagina: normal appearing vagina with normal color and discharge, no lesions               Cervix: absent              Pap taken: No. Bimanual Exam:  Uterus:  uterus absent              Adnexa: no mass, fullness, tenderness               Rectovaginal: Confirms               Anus:  normal sphincter tone, no lesions  Chaperone present: yes  A:  Well Woman with normal exam  Menopausal no HRT s/p LAVH with BSO  Osteoporosis BMD due  History of Breast cancer 2008 up to date with mammogram  Atrophic Vaginitis coconut oil working well  MD management of hypothyroid, cholesterol, anxiety  P:   Reviewed health and wellness pertinent to exam  Aware of importance to advise if vaginal bleeding  Will schedule prior to leaving office today  Stressed SBE and mammogram as recommended.  Continue follow up with PCP as indicated.  Pap smear as above   counseled on breast self exam, mammography screening, feminine hygiene, osteoporosis, adequate intake of calcium and vitamin D  return annually or prn  An After Visit Summary was printed and given to the patient.

## 2016-06-17 ENCOUNTER — Other Ambulatory Visit: Payer: Self-pay | Admitting: Family Medicine

## 2016-06-23 NOTE — Progress Notes (Signed)
Encounter reviewed Zanita Millman, MD   

## 2016-06-25 ENCOUNTER — Encounter: Payer: Self-pay | Admitting: Family Medicine

## 2016-07-04 ENCOUNTER — Encounter: Payer: Self-pay | Admitting: Family Medicine

## 2016-07-04 ENCOUNTER — Other Ambulatory Visit (INDEPENDENT_AMBULATORY_CARE_PROVIDER_SITE_OTHER): Payer: BC Managed Care – PPO

## 2016-07-04 ENCOUNTER — Ambulatory Visit (INDEPENDENT_AMBULATORY_CARE_PROVIDER_SITE_OTHER): Payer: BC Managed Care – PPO | Admitting: Family Medicine

## 2016-07-04 VITALS — BP 134/72 | HR 87 | Resp 16 | Ht 65.0 in | Wt 141.1 lb

## 2016-07-04 DIAGNOSIS — M2550 Pain in unspecified joint: Secondary | ICD-10-CM

## 2016-07-04 DIAGNOSIS — M48061 Spinal stenosis, lumbar region without neurogenic claudication: Secondary | ICD-10-CM | POA: Diagnosis not present

## 2016-07-04 DIAGNOSIS — M170 Bilateral primary osteoarthritis of knee: Secondary | ICD-10-CM | POA: Diagnosis not present

## 2016-07-04 DIAGNOSIS — M5416 Radiculopathy, lumbar region: Secondary | ICD-10-CM

## 2016-07-04 LAB — SEDIMENTATION RATE: SED RATE: 8 mm/h (ref 0–30)

## 2016-07-04 LAB — IBC PANEL
IRON: 147 ug/dL — AB (ref 42–145)
SATURATION RATIOS: 47.3 % (ref 20.0–50.0)
Transferrin: 222 mg/dL (ref 212.0–360.0)

## 2016-07-04 LAB — C-REACTIVE PROTEIN: CRP: 0.2 mg/dL — AB (ref 0.5–20.0)

## 2016-07-04 LAB — MAGNESIUM: MAGNESIUM: 2 mg/dL (ref 1.5–2.5)

## 2016-07-04 LAB — TSH: TSH: 1.23 u[IU]/mL (ref 0.35–4.50)

## 2016-07-04 LAB — CK: Total CK: 95 U/L (ref 7–177)

## 2016-07-04 LAB — VITAMIN D 25 HYDROXY (VIT D DEFICIENCY, FRACTURES): VITD: 67.4 ng/mL (ref 30.00–100.00)

## 2016-07-04 NOTE — Patient Instructions (Addendum)
Good to see you  Alvera Singh is your friend.  We started synvisc today  First injection today and maybe not a lot of improvement  See me again next week!

## 2016-07-04 NOTE — Assessment & Plan Note (Signed)
Patient has a history of this previously. Discussed with patient at great length. We discussed the possibility of leg cramping being something other organic. Discussed with patient at great length. Patient is to have labs done from polymyalgia and leg spasms. Follow-up again after labs since see patient again next week for the injections.

## 2016-07-04 NOTE — Assessment & Plan Note (Signed)
Patient was started on viscous supplementation today after failing all conservative therapy and worsening pain. We discussed with patient about icing, patient was given Celebrex by her primary care provider and I encourage her to try this. Continue all other medications including the once weekly vitamin D. We discussed icing regimen. We'll get labs to rule out any other organic causes of pain such as autoimmune diseases, ionized calcium, and others. Patient will come back in 1 week for second in a series of 3 injections in the knees bilaterally.

## 2016-07-04 NOTE — Progress Notes (Signed)
[name] [address] [place], [place] [address] Phone: [phone] Subjective:    I'm seeing this patient by the request  of:  SHAW,KIMBERLEE, MD  CC:  Bilateral knee pain f/u  [id number]  [name] is a 66 y.o. female coming in with complaint of bilateral knee pain. Has had it for years.  Patient saw me and did have bilateral knee pain. Patient was given injections bilaterally Back in February. Patient was doing significantly better at last follow-up. Patient is now 9-10 weeks out from previous injections. Patient states pain is increasing severely at this time. Patient is having some pain than she is missing certain activities. Patient states it can wake her up at night. Still having some spasming in the legs as well.   patient and x-rays in 2015. These were independently visualized by me in bilateral standing knee x-ray showed patient having severe medial compartment arthritic changes as well as with potential loose bodies.  Past Medical History:  Diagnosis Date  . Anxiety   . Breast cancer ([place]) 11/1997   RIGHT  . Broken arm 6/16   right  . Depression   . Hypercholesteremia   . Osteoarthritis   . Thyroid disease    hypothyroid   Past Surgical History:  Procedure Laterality Date  . BREAST LUMPECTOMY Right 11/1997  . MOUTH SURGERY    . NASAL SINUS SURGERY    . VAGINAL HYSTERECTOMY  12/03   LAVH-BSO   Social History   Social History  . Marital status: Married    Spouse name: N/A  . Number of children: N/A  . Years of education: N/A   Social History Main Topics  . Smoking status: Never Smoker  . Smokeless tobacco: Never Used  . Alcohol use 3.6 - 4.8 oz/week    6 - 8 Standard drinks or equivalent per week  . Drug use: No  . Sexual activity: No     Comment: LAVH   Other Topics Concern  . None   Social History Narrative  . None   No Known Allergies Family History  Problem Relation Age of Onset  . Rheum arthritis Mother    . Non-Hodgkin's lymphoma Sister     Past medical history, social, surgical and family history all reviewed in electronic medical record.  No pertanent information unless stated regarding to the chief complaint.   Review of Systems: No headache, visual changes, nausea, vomiting, diarrhea, constipation, dizziness, abdominal pain, skin rash, fevers, chills, night sweats, weight loss, swollen lymph nodes, , chest pain, shortness of breath, mood changes.  Positive muscle aches question will joint swelling positive body aches   Objective  Blood pressure 134/72, pulse 87, resp. rate 16, height 5\' 5"  (1.651 m), weight 141 lb 2 oz (64 kg), last menstrual period 03/05/1997, SpO2 98 %.   Systems examined below as of 07/04/16 General: NAD A&O x3 mood, affect normal  HEENT: Pupils equal, extraocular movements intact no nystagmus Respiratory: not short of breath at rest or with speaking Cardiovascular: No lower extremity edema, non tender Skin: Warm dry intact with no signs of infection or rash on extremities or on axial skeleton. Abdomen: Soft nontender, no masses Neuro: Cranial nerves  intact, neurovascularly intact in all extremities with 2+ DTRs and 2+ pulses. Lymph: No lymphadenopathy appreciated today  Gait Mild antalgic gait.  MSK: Non tender with full range of motion and good stability and symmetric strength and tone of shoulders, elbows, wrist, hips and ankles bilaterally.  Arthritic changes of Knee: Bilateral valgus deformity noted. Large thigh to calf ratio.  Tender to palpation over medial and PF joint line.  ROM full in flexion and extension and lower leg rotation. instability with valgus force.  painful patellar compression. Patellar glide with moderate crepitus. Patellar and quadriceps tendons unremarkable. Hamstring and quadriceps strength is normal. Contralateral knee shows .  After informed written and verbal consent, patient was seated on exam table. Right knee was prepped  with alcohol swab and utilizing anterolateral approach, patient's right knee space was injected with16 mg/2.5 mL of Synvisc (sodium hyaluronate) in a prefilled syringe was injected easily into the knee through a 22-gauge needle. Patient tolerated the procedure well without immediate complications.  After informed written and verbal consent, patient was seated on exam table. Left knee was prepped with alcohol swab and utilizing anterolateral approach, patient's left knee space was injected with 16 mg/2.5 mL of Synvisc (sodium hyaluronate) in a prefilled syringe was injected easily into the knee through a 22-gauge needle.. Patient tolerated the procedure well without immediate complications.   Impression and Recommendations:     This case required medical decision making of moderate complexity.      Note: This dictation was prepared with Dragon dictation along with smaller phrase technology. Any transcriptional errors that result from this process are unintentional.

## 2016-07-04 NOTE — Progress Notes (Signed)
Pre-visit discussion using our clinic review tool. No additional management support is needed unless otherwise documented below in the visit note.  

## 2016-07-05 LAB — CYCLIC CITRUL PEPTIDE ANTIBODY, IGG: Cyclic Citrullin Peptide Ab: <16 Units

## 2016-07-05 LAB — CALCIUM, IONIZED: Calcium, Ion: 5.4 mg/dL (ref 4.8–5.6)

## 2016-07-05 LAB — RHEUMATOID FACTOR

## 2016-07-05 LAB — ANA: ANA: NEGATIVE

## 2016-07-05 LAB — PAN-ANCA
ANCA Screen: NEGATIVE
Serine Protease 3: <1

## 2016-07-06 ENCOUNTER — Encounter: Payer: Self-pay | Admitting: Family Medicine

## 2016-07-10 NOTE — Progress Notes (Signed)
[name] [address] [place], [place] [address] Phone: [phone] Subjective:    I'm seeing this patient by the request  of:  Mayra Neer, MD  CC:  Bilateral knee pain f/u  [id number]  [name] is a 66 y.o. female coming in with complaint of bilateral knee pain. Has had it for years.  Patient saw me and did have bilateral knee pain. Done have degenerative joint disease. Failed all conservative therapy. Patient is here for second in a series of 3 injections for viscous supplementation. Patient states no improvement so far.    patient and x-rays in 2015. These were independently visualized by me in bilateral standing knee x-ray showed patient having severe medial compartment arthritic changes as well as with potential loose bodies.  Past Medical History:  Diagnosis Date  . Anxiety   . Breast cancer ([place]) 11/1997   RIGHT  . Broken arm 6/16   right  . Depression   . Hypercholesteremia   . Osteoarthritis   . Thyroid disease    hypothyroid   Past Surgical History:  Procedure Laterality Date  . BREAST LUMPECTOMY Right 11/1997  . MOUTH SURGERY    . NASAL SINUS SURGERY    . VAGINAL HYSTERECTOMY  12/03   LAVH-BSO   Social History   Social History  . Marital status: Married    Spouse name: N/A  . Number of children: N/A  . Years of education: N/A   Social History Main Topics  . Smoking status: Never Smoker  . Smokeless tobacco: Never Used  . Alcohol use 3.6 - 4.8 oz/week    6 - 8 Standard drinks or equivalent per week  . Drug use: No  . Sexual activity: No     Comment: LAVH   Other Topics Concern  . None   Social History Narrative  . None   No Known Allergies Family History  Problem Relation Age of Onset  . Rheum arthritis Mother   . Non-Hodgkin's lymphoma Sister     Past medical history, social, surgical and family history all reviewed in electronic medical record.  No pertanent information unless stated regarding to  the chief complaint.   Review of Systems: No headache, visual changes, nausea, vomiting, diarrhea, constipation, dizziness, abdominal pain, skin rash, fevers, chills, night sweats, weight loss, swollen lymph nodes, body aches, joint swelling,chest pain, shortness of breath, mood changes.  + muscle aches   Objective  Blood pressure 128/76, pulse 99, resp. rate 16, weight 141 lb 6 oz (64.1 kg), last menstrual period 03/05/1997, SpO2 98 %.   Systems examined below as of 07/11/16 General: NAD A&O x3 mood, affect normal  HEENT: Pupils equal, extraocular movements intact no nystagmus Respiratory: not short of breath at rest or with speaking Cardiovascular: No lower extremity edema, non tender Skin: Warm dry intact with no signs of infection or rash on extremities or on axial skeleton. Abdomen: Soft nontender, no masses Neuro: Cranial nerves  intact, neurovascularly intact in all extremities with 2+ DTRs and 2+ pulses. Lymph: No lymphadenopathy appreciated today  Gait Mild antalgic gait.  MSK: Non tender with full range of motion and good stability and symmetric strength and tone of shoulders, elbows, wrist, hips and ankles bilaterally.    Arthritic changes of Knee: Bilateral valgus deformity noted. Large thigh to calf ratio.  Tender to palpation over medial and PF joint line.  ROM full in flexion and extension and lower leg rotation. instability with valgus force.  painful patellar compression. Patellar glide with moderate crepitus. Patellar and quadriceps tendons unremarkable. Hamstring and quadriceps strength is normal.  After informed written and verbal consent, patient was seated on exam table. Right knee was prepped with alcohol swab and utilizing anterolateral approach, patient's right knee space was injected with 16 mg/2.5 mL of Synvisc (sodium hyaluronate) in a prefilled syringe was injected easily into the knee through a 22-gauge needle. Patient tolerated the procedure well without  immediate complications.    After informed written and verbal consent, patient was seated on exam table. Left knee was prepped with alcohol swab and utilizing anterolateral approach, patient's left knee space was injected with 16 mg/2.5 mL of Synvisc (sodium hyaluronate) in a prefilled syringe was injected easily into the knee through a 22-gauge needle.. Patient tolerated the procedure well without immediate complications. Impression and Recommendations:     This case required medical decision making of moderate complexity.      Note: This dictation was prepared with Dragon dictation along with smaller phrase technology. Any transcriptional errors that result from this process are unintentional.

## 2016-07-11 ENCOUNTER — Ambulatory Visit (INDEPENDENT_AMBULATORY_CARE_PROVIDER_SITE_OTHER): Payer: BC Managed Care – PPO | Admitting: Family Medicine

## 2016-07-11 ENCOUNTER — Encounter: Payer: Self-pay | Admitting: Family Medicine

## 2016-07-11 VITALS — BP 128/76 | HR 99 | Resp 16 | Wt 141.4 lb

## 2016-07-11 DIAGNOSIS — M170 Bilateral primary osteoarthritis of knee: Secondary | ICD-10-CM | POA: Diagnosis not present

## 2016-07-11 DIAGNOSIS — R0683 Snoring: Secondary | ICD-10-CM

## 2016-07-11 NOTE — Assessment & Plan Note (Signed)
Second in a series of 3 injections given today, contin same tx  F/u in 1 week.

## 2016-07-11 NOTE — Patient Instructions (Signed)
good to see you  Alvera Singh is your friend.  COnsider the "dog cream" See you again for the last one next week!

## 2016-07-16 ENCOUNTER — Encounter: Payer: Self-pay | Admitting: Family Medicine

## 2016-07-16 NOTE — Telephone Encounter (Signed)
Pt has a specific question about celebrex that I am not able to answer. I responded to the sleep study question and can assist if there is not appointment set up for your patient by Wednesday.

## 2016-07-17 NOTE — Progress Notes (Signed)
[name] [address] [place], [place] [address] Phone: [phone] Subjective:    I'm seeing this patient by the request  of:  Mayra Neer, MD  CC:  Bilateral knee pain f/u  [id number]  [name] is a 66 y.o. female coming in with complaint of bilateral knee pain. Has had it for years.  Patient saw me and did have bilateral knee pain. Done have degenerative joint disease. Failed all conservative therapy. Patient is here for third internal of the bilateral viscous supplementation injections. Patient states Minimal improvement so far. Did have a lot of burning after the injections.   patient and x-rays in 2015. These were independently visualized by me in bilateral standing knee x-ray showed patient having severe medial compartment arthritic changes as well as with potential loose bodies.  Past Medical History:  Diagnosis Date  . Anxiety   . Breast cancer ([place]) 11/1997   RIGHT  . Broken arm 6/16   right  . Depression   . Hypercholesteremia   . Osteoarthritis   . Thyroid disease    hypothyroid   Past Surgical History:  Procedure Laterality Date  . BREAST LUMPECTOMY Right 11/1997  . MOUTH SURGERY    . NASAL SINUS SURGERY    . VAGINAL HYSTERECTOMY  12/03   LAVH-BSO   Social History   Social History  . Marital status: Married    Spouse name: N/A  . Number of children: N/A  . Years of education: N/A   Social History Main Topics  . Smoking status: Never Smoker  . Smokeless tobacco: Never Used  . Alcohol use 3.6 - 4.8 oz/week    6 - 8 Standard drinks or equivalent per week  . Drug use: No  . Sexual activity: No     Comment: LAVH   Other Topics Concern  . None   Social History Narrative  . None   No Known Allergies Family History  Problem Relation Age of Onset  . Rheum arthritis Mother   . Non-Hodgkin's lymphoma Sister     Past medical history, social, surgical and family history all reviewed in electronic medical  record.  No pertanent information unless stated regarding to the chief complaint.   Review of Systems: No headache, visual changes, nausea, vomiting, diarrhea, constipation, dizziness, abdominal pain, skin rash, fevers, chills, night sweats, weight loss, swollen lymph nodes, body aches, joint swelling, chest pain, shortness of breath, mood changes.   + muscle aches   Objective  Blood pressure 126/74, pulse 83, height 5\' 5"  (1.651 m), weight 141 lb (64 kg), last menstrual period 03/05/1997, SpO2 94 %.   Systems examined below as of 07/18/16 General: NAD A&O x3 mood, affect normal  HEENT: Pupils equal, extraocular movements intact no nystagmus Respiratory: not short of breath at rest or with speaking Cardiovascular: No lower extremity edema, non tender Skin: Warm dry intact with no signs of infection or rash on extremities or on axial skeleton. Abdomen: Soft nontender, no masses Neuro: Cranial nerves  intact, neurovascularly intact in all extremities with 2+ DTRs and 2+ pulses. Lymph: No lymphadenopathy appreciated today  Gait Mild antalgic gait.  MSK: Non tender with full range of motion and good stability and symmetric strength and tone of shoulders, elbows, wrist, hips and ankles bilaterally.    Arthritic changes of Knee: Bilateral valgus deformity noted. Large thigh to calf ratio.  Tender to palpation over medial and PF joint line.  ROM full in flexion and extension and  lower leg rotation. instability with valgus force.  painful patellar compression. Patellar glide with moderate crepitus. Patellar and quadriceps tendons unremarkable. Hamstring and quadriceps strength is normal. Contralateral knee shows   After informed written and verbal consent, patient was seated on exam table. Right knee was prepped with alcohol swab and utilizing anterolateral approach, patient's right knee space was injected with 16 mg/2.5 mL of Synvisc (sodium hyaluronate) in a prefilled syringe was injected  easily into the knee through a 22-gauge needle. Patient tolerated the procedure well without immediate complications.  After informed written and verbal consent, patient was seated on exam table. Left knee was prepped with alcohol swab and utilizing anterolateral approach, patient's left knee space was injected with 16 mg/2.5 mL of Synvisc (sodium hyaluronate) in a prefilled syringe was injected easily into the knee through a 22-gauge needle. Patient tolerated the procedure well without immediate complications.     Impression and Recommendations:     This case required medical decision making of moderate complexity.      Note: This dictation was prepared with Dragon dictation along with smaller phrase technology. Any transcriptional errors that result from this process are unintentional.

## 2016-07-18 ENCOUNTER — Encounter: Payer: Self-pay | Admitting: Family Medicine

## 2016-07-18 ENCOUNTER — Ambulatory Visit (INDEPENDENT_AMBULATORY_CARE_PROVIDER_SITE_OTHER): Payer: BC Managed Care – PPO | Admitting: Family Medicine

## 2016-07-18 DIAGNOSIS — M170 Bilateral primary osteoarthritis of knee: Secondary | ICD-10-CM

## 2016-07-18 NOTE — Patient Instructions (Signed)
Yippie!!!! Last one.  You get a break from me.  Continue the exercises and icing.  I expect this to continue to get better over the course of the next month.  I have my fingers crossed.  See me again in another month!

## 2016-07-18 NOTE — Assessment & Plan Note (Signed)
Third and final viscous supplementation injection given today. Patient tolerated the procedure well. We discussed icing regimen. We discussed any instability. Patient will start increasing activity as tolerated. Patient wants to hold off on any type of replacement until December. Patient will come back and see me again in 1 month.

## 2016-07-24 ENCOUNTER — Encounter: Payer: Self-pay | Admitting: Family Medicine

## 2016-07-24 ENCOUNTER — Other Ambulatory Visit: Payer: Self-pay

## 2016-07-24 DIAGNOSIS — R0683 Snoring: Secondary | ICD-10-CM

## 2016-08-09 ENCOUNTER — Encounter: Payer: Self-pay | Admitting: Family Medicine

## 2016-08-17 ENCOUNTER — Ambulatory Visit: Payer: Self-pay | Admitting: Family Medicine

## 2016-08-22 ENCOUNTER — Encounter: Payer: Self-pay | Admitting: Family Medicine

## 2016-08-22 DIAGNOSIS — R0683 Snoring: Secondary | ICD-10-CM

## 2016-08-24 ENCOUNTER — Other Ambulatory Visit: Payer: Self-pay | Admitting: Family Medicine

## 2016-09-07 ENCOUNTER — Ambulatory Visit (INDEPENDENT_AMBULATORY_CARE_PROVIDER_SITE_OTHER): Payer: BC Managed Care – PPO | Admitting: Family Medicine

## 2016-09-07 ENCOUNTER — Encounter: Payer: Self-pay | Admitting: Family Medicine

## 2016-09-07 DIAGNOSIS — M170 Bilateral primary osteoarthritis of knee: Secondary | ICD-10-CM | POA: Diagnosis not present

## 2016-09-07 MED ORDER — TIZANIDINE HCL 4 MG PO TABS: 4.0000 mg | ORAL_TABLET | Freq: Every evening | ORAL | 2 refills | Status: AC

## 2016-09-07 NOTE — Patient Instructions (Signed)
Good to see you  We will check once again on the sleep study.  You can call at [phone] Continue the vitamins Zanaflex at night may help with the cramps.  We could try gabapentin as well again but I understand Read on PRP and see what you think.  See me when you need me.

## 2016-09-07 NOTE — Progress Notes (Signed)
[name] [address] [place], [place] [address] Phone: [phone] Subjective:    I'm seeing this patient by the request  of:  Mayra Neer, MD  CC:  Bilateral knee pain f/u  [id number]  [name] is a 66 y.o. female coming in with complaint of bilateral knee pain.Patient was found to have severe arthritis of the knees with potential loose bodies. Patient elected to try conservative therapy which she failed. Started viscous supplementation and had last injection one month ago. Patient states minimal improvement overall. Still has days where she has severe pain and is unable to do activities. Patient states some swelling intermittent. Patient has started biking again though which has felt better.  Patient is still having leg cramps at night. Continues taking iron and magnesium regularly. Still only at night. Patient has not had the sleep study done yet.   patient and x-rays in 2015. These were independently visualized by me in bilateral standing knee x-ray showed patient having severe medial compartment arthritic changes as well as with potential loose bodies.  Past Medical History:  Diagnosis Date  . Anxiety   . Breast cancer ([place]) 11/1997   RIGHT  . Broken arm 6/16   right  . Depression   . Hypercholesteremia   . Osteoarthritis   . Thyroid disease    hypothyroid   Past Surgical History:  Procedure Laterality Date  . BREAST LUMPECTOMY Right 11/1997  . MOUTH SURGERY    . NASAL SINUS SURGERY    . VAGINAL HYSTERECTOMY  12/03   LAVH-BSO   Social History   Social History  . Marital status: Married    Spouse name: N/A  . Number of children: N/A  . Years of education: N/A   Social History Main Topics  . Smoking status: Never Smoker  . Smokeless tobacco: Never Used  . Alcohol use 3.6 - 4.8 oz/week    6 - 8 Standard drinks or equivalent per week  . Drug use: No  . Sexual activity: No     Comment: LAVH   Other Topics Concern  .  None   Social History Narrative  . None   No Known Allergies Family History  Problem Relation Age of Onset  . Rheum arthritis Mother   . Non-Hodgkin's lymphoma Sister     Past medical history, social, surgical and family history all reviewed in electronic medical record.  No pertanent information unless stated regarding to the chief complaint.   Review of Systems: No headache, visual changes, nausea, vomiting, diarrhea, constipation, dizziness, abdominal pain, skin rash, fevers, chills, night sweats, weight loss, swollen lymph nodes, body aches, joint swelling, muscle aches, chest pain, shortness of breath, mood changes.    + muscle aches   Objective  Blood pressure 112/76, pulse 82, height 5\' 5"  (1.651 m), weight 141 lb (64 kg), last menstrual period 03/05/1997, SpO2 97 %.   Systems examined below as of 09/07/16 General: NAD A&O x3 mood, affect normal  HEENT: Pupils equal, extraocular movements intact no nystagmus Respiratory: not short of breath at rest or with speaking Cardiovascular: No lower extremity edema, non tender Skin: Warm dry intact with no signs of infection or rash on extremities or on axial skeleton. Abdomen: Soft nontender, no masses Neuro: Cranial nerves  intact, neurovascularly intact in all extremities with 2+ DTRs and 2+ pulses. Lymph: No lymphadenopathy appreciated today  Gait normal with good balance and coordination. Improved MSK: Non tender with full range of motion and  good stability and symmetric strength and tone of shoulders, elbows, wrist,  knee hips and ankles bilaterally.  Arthritic changes of multiple joints Knee: Bilateral valgus deformity noted.  Tender to palpation over medial and PF joint line.  ROM full in flexion and extension and lower leg rotation. instability with valgus force.  painful patellar compression. Patellar glide with moderate crepitus. Patellar and quadriceps tendons unremarkable. Hamstring and quadriceps strength is  normal.      Impression and Recommendations:     This case required medical decision making of moderate complexity.      Note: This dictation was prepared with Dragon dictation along with smaller phrase technology. Any transcriptional errors that result from this process are unintentional.

## 2016-09-07 NOTE — Assessment & Plan Note (Signed)
Spent  25 minutes with patient face-to-face and had greater than 50% of counseling including as described in assessment and plan. This includes nonsurgical and surgical options. We discussed PRP, repeating formal physical therapy, custom bracing, or surgical intervention. Patient wants to consider surgery but not until the wintertime. Does not want another injection today. Patient will try to continue with the conservative therapy. We discussed icing regimen. Topical anti-inflammatories. Muscle relaxer given for the cramping at night and encourage her to get to sleep study done. Follow-up again with me in 6 weeks.

## 2016-09-28 ENCOUNTER — Encounter: Payer: Self-pay | Admitting: Family Medicine

## 2016-09-28 DIAGNOSIS — M1711 Unilateral primary osteoarthritis, right knee: Secondary | ICD-10-CM

## 2016-10-17 ENCOUNTER — Encounter: Payer: Self-pay | Admitting: Family Medicine

## 2016-10-23 ENCOUNTER — Institutional Professional Consult (permissible substitution): Payer: BC Managed Care – PPO | Admitting: Neurology

## 2016-11-15 ENCOUNTER — Other Ambulatory Visit: Payer: Self-pay | Admitting: Family Medicine

## 2016-11-21 ENCOUNTER — Institutional Professional Consult (permissible substitution): Payer: BC Managed Care – PPO | Admitting: Neurology

## 2016-12-05 ENCOUNTER — Institutional Professional Consult (permissible substitution): Payer: BC Managed Care – PPO | Admitting: Neurology

## 2016-12-05 ENCOUNTER — Other Ambulatory Visit: Payer: Self-pay | Admitting: Family Medicine

## 2016-12-05 DIAGNOSIS — Z1231 Encounter for screening mammogram for malignant neoplasm of breast: Secondary | ICD-10-CM

## 2016-12-21 ENCOUNTER — Ambulatory Visit (INDEPENDENT_AMBULATORY_CARE_PROVIDER_SITE_OTHER): Payer: BC Managed Care – PPO | Admitting: Family Medicine

## 2016-12-21 ENCOUNTER — Encounter: Payer: Self-pay | Admitting: Family Medicine

## 2016-12-21 DIAGNOSIS — M170 Bilateral primary osteoarthritis of knee: Secondary | ICD-10-CM

## 2016-12-21 MED ORDER — IBUPROFEN-FAMOTIDINE 800-26.6 MG PO TABS: 1.0000 | ORAL_TABLET | Freq: Three times a day (TID) | ORAL | 3 refills | Status: DC | PRN

## 2016-12-21 MED ORDER — NORTRIPTYLINE HCL 10 MG PO CAPS: 10.0000 mg | ORAL_CAPSULE | Freq: Every day | ORAL | 1 refills | Status: DC

## 2016-12-21 NOTE — Patient Instructions (Addendum)
Good to see you  [name] is your friend.  Tylenol 325mg -650mg  3 times a day  Duexis 3 times a day  Nortriptyline 10 mg at night Send me a message in 2 weeks and tell me how you are doing

## 2016-12-21 NOTE — Assessment & Plan Note (Signed)
Spent  25 minutes with patient face-to-face and had greater than 50% of counseling including as described in assessment and plan.Severe overall. Patient wants to avoid any type of injection. Patient given a oral anti-inflammatory as well as encourage Tylenol regularly. Patient did not feel that topical anti-inflammatories were helpful. Nortriptyline to help with nighttime relief. Discussed otherwise we would need to consider doing another medications on a more regular basis. Follow-up with me again in 4-6 weeks.
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
## 2016-12-27 MED ORDER — IBUPROFEN-FAMOTIDINE 800-26.6 MG PO TABS: 1.0000 | ORAL_TABLET | Freq: Three times a day (TID) | ORAL | 1 refills | Status: DC | PRN

## 2016-12-27 NOTE — Telephone Encounter (Signed)
Please advise, I do not see where Duexis was sent to the pharmacy  Can this be sent?

## 2017-01-01 NOTE — Telephone Encounter (Signed)
Patient is still having issues with not being able to get this medication.  States she was sent an email today from the pharmacy.  I will forward to you once I get this.  Thanks!

## 2017-01-04 ENCOUNTER — Encounter: Payer: Self-pay | Admitting: Family Medicine

## 2017-01-14 ENCOUNTER — Encounter: Payer: Self-pay | Admitting: Family Medicine

## 2017-01-15 ENCOUNTER — Encounter: Payer: Self-pay | Admitting: Family Medicine

## 2017-01-17 MED ORDER — TRAMADOL HCL 50 MG PO TABS: 50.0000 mg | ORAL_TABLET | Freq: Two times a day (BID) | ORAL | 0 refills | Status: DC | PRN

## 2017-01-22 ENCOUNTER — Encounter: Payer: Self-pay | Admitting: Family Medicine

## 2017-01-28 ENCOUNTER — Ambulatory Visit
Admission: RE | Admit: 2017-01-28 | Discharge: 2017-01-28 | Disposition: A | Discharge status: Home / Self Care | Payer: BC Managed Care – PPO | Source: Ambulatory Visit | Attending: Family Medicine | Admitting: Family Medicine

## 2017-01-28 DIAGNOSIS — Z1231 Encounter for screening mammogram for malignant neoplasm of breast: Secondary | ICD-10-CM

## 2017-01-28 HISTORY — DX: Personal history of irradiation: Z92.3

## 2017-01-28 HISTORY — DX: Personal history of antineoplastic chemotherapy: Z92.21

## 2017-02-07 ENCOUNTER — Ambulatory Visit (INDEPENDENT_AMBULATORY_CARE_PROVIDER_SITE_OTHER): Payer: BC Managed Care – PPO | Admitting: Sports Medicine

## 2017-02-07 ENCOUNTER — Encounter: Payer: Self-pay | Admitting: Sports Medicine

## 2017-02-07 ENCOUNTER — Other Ambulatory Visit: Payer: Self-pay | Admitting: Family Medicine

## 2017-02-07 VITALS — BP 136/70 | Ht 65.0 in | Wt 137.0 lb

## 2017-02-07 DIAGNOSIS — M170 Bilateral primary osteoarthritis of knee: Secondary | ICD-10-CM

## 2017-02-07 NOTE — Assessment & Plan Note (Signed)
Patient has known severe bilateral knee arthritis. She has good strength in her legs and is very motivated so would be an excellent candidate for concurrent bilateral knee replacement, advised to speak with her orthopedic surgeon regarding this option. Recommended continued use of tramadol to help with function until she can have surgery.

## 2017-02-07 NOTE — Telephone Encounter (Signed)
Refill done.  
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
## 2017-02-08 ENCOUNTER — Encounter: Payer: Self-pay | Admitting: Family Medicine

## 2017-02-12 ENCOUNTER — Other Ambulatory Visit: Payer: Self-pay | Admitting: Family Medicine

## 2017-02-12 NOTE — Telephone Encounter (Signed)
Refill done.  

## 2017-03-10 ENCOUNTER — Other Ambulatory Visit: Payer: Self-pay | Admitting: Family Medicine

## 2017-03-10 ENCOUNTER — Encounter: Payer: Self-pay | Admitting: Family Medicine

## 2017-03-19 ENCOUNTER — Other Ambulatory Visit: Payer: Self-pay | Admitting: Orthopedic Surgery

## 2017-03-29 ENCOUNTER — Other Ambulatory Visit: Payer: Self-pay | Admitting: Orthopedic Surgery

## 2017-04-09 ENCOUNTER — Encounter (HOSPITAL_COMMUNITY): Payer: Self-pay

## 2017-04-09 ENCOUNTER — Ambulatory Visit (HOSPITAL_COMMUNITY)
Admission: RE | Admit: 2017-04-09 | Discharge: 2017-04-09 | Disposition: A | Discharge status: Home / Self Care | Payer: Medicare Other | Source: Ambulatory Visit | Attending: Orthopedic Surgery | Admitting: Orthopedic Surgery

## 2017-04-09 ENCOUNTER — Other Ambulatory Visit: Payer: Self-pay

## 2017-04-09 ENCOUNTER — Encounter (HOSPITAL_COMMUNITY)
Admission: RE | Admit: 2017-04-09 | Discharge: 2017-04-09 | Disposition: A | Discharge status: Home / Self Care | Payer: Medicare Other | Source: Ambulatory Visit | Attending: Orthopedic Surgery | Admitting: Orthopedic Surgery

## 2017-04-09 DIAGNOSIS — Z01818 Encounter for other preprocedural examination: Secondary | ICD-10-CM

## 2017-04-09 DIAGNOSIS — R918 Other nonspecific abnormal finding of lung field: Secondary | ICD-10-CM | POA: Insufficient documentation

## 2017-04-09 HISTORY — DX: Hypothyroidism, unspecified: E03.9

## 2017-04-09 LAB — CBC WITH DIFFERENTIAL/PLATELET
BASOS ABS: 0 10*3/uL (ref 0.0–0.1)
BASOS PCT: 1 %
Eosinophils Absolute: 0.2 10*3/uL (ref 0.0–0.7)
Eosinophils Relative: 3 %
HEMATOCRIT: 40.5 % (ref 36.0–46.0)
HEMOGLOBIN: 13.4 g/dL (ref 12.0–15.0)
LYMPHS PCT: 25 %
Lymphs Abs: 1.3 10*3/uL (ref 0.7–4.0)
MCH: 31.4 pg (ref 26.0–34.0)
MCHC: 33.1 g/dL (ref 30.0–36.0)
MCV: 94.8 fL (ref 78.0–100.0)
Monocytes Absolute: 0.4 10*3/uL (ref 0.1–1.0)
Monocytes Relative: 8 %
NEUTROS ABS: 3.3 10*3/uL (ref 1.7–7.7)
NEUTROS PCT: 63 %
Platelets: 232 10*3/uL (ref 150–400)
RBC: 4.27 MIL/uL (ref 3.87–5.11)
RDW: 12.8 % (ref 11.5–15.5)
WBC: 5.3 10*3/uL (ref 4.0–10.5)

## 2017-04-09 LAB — URINALYSIS, ROUTINE W REFLEX MICROSCOPIC
BILIRUBIN URINE: NEGATIVE
Glucose, UA: NEGATIVE mg/dL
HGB URINE DIPSTICK: NEGATIVE
KETONES UR: NEGATIVE mg/dL
Leukocytes, UA: NEGATIVE
Nitrite: NEGATIVE
PROTEIN: NEGATIVE mg/dL
Specific Gravity, Urine: 1.005 (ref 1.005–1.030)
pH: 9 — ABNORMAL HIGH (ref 5.0–8.0)

## 2017-04-09 LAB — BASIC METABOLIC PANEL
ANION GAP: 14 (ref 5–15)
BUN: 11 mg/dL (ref 6–20)
CALCIUM: 10.4 mg/dL — AB (ref 8.9–10.3)
CO2: 26 mmol/L (ref 22–32)
Chloride: 99 mmol/L — ABNORMAL LOW (ref 101–111)
Creatinine, Ser: 0.83 mg/dL (ref 0.44–1.00)
Glucose, Bld: 93 mg/dL (ref 65–99)
POTASSIUM: 4.5 mmol/L (ref 3.5–5.1)
Sodium: 139 mmol/L (ref 135–145)

## 2017-04-09 LAB — APTT: APTT: 31 s (ref 24–36)

## 2017-04-09 LAB — TYPE AND SCREEN
ABO/RH(D): O NEG
ANTIBODY SCREEN: NEGATIVE

## 2017-04-09 LAB — PROTIME-INR
INR: 0.88
Prothrombin Time: 11.8 seconds (ref 11.4–15.2)

## 2017-04-09 LAB — SURGICAL PCR SCREEN
MRSA, PCR: NEGATIVE
Staphylococcus aureus: POSITIVE — AB

## 2017-04-09 LAB — ABO/RH: ABO/RH(D): O NEG

## 2017-04-09 NOTE — Pre-Procedure Instructions (Signed)
Evalie R Lasorsa  04/09/2017      [id number] [address], [place] [address] [place] [address] Phone: [phone] Fax: [phone]  [facility] [place], [place] [address] Phone: [phone] Fax: [phone]    Your procedure is scheduled on  Monday 04/15/17  Report to [facility] Admitting at 1050 A.M.  Call this number if you have problems the morning of surgery:  [phone]   Remember:  Do not eat food or drink liquids after midnight.  Take these medicines the morning of surgery with A SIP OF WATER - ALPRAZOLAM IF NEEDED, LEVOTHYROXINE, TRAMADOL IF NEEDED  7 days prior to surgery STOP taking any Aspirin(unless otherwise instructed by your surgeon), Aleve, Naproxen, Ibuprofen, Motrin, Advil, Goody's, BC's, all herbal medications, fish oil, and all vitamins    Do not wear jewelry, make-up or nail polish.  Do not wear lotions, powders, or perfumes, or deodorant.  Do not shave 48 hours prior to surgery.  Men may shave face and neck.  Do not bring valuables to the hospital.  [facility] is not responsible for any belongings or valuables.  Contacts, dentures or bridgework may not be worn into surgery.  Leave your suitcase in the car.  After surgery it may be brought to your room.  For patients admitted to the hospital, discharge time will be determined by your treatment team.  Patients discharged the day of surgery will not be allowed to drive home.   Name and phone number of your driver:    Special instructions:  [place] - Preparing for Surgery  Before surgery, you can play an important role.  Because skin is not sterile, your skin needs to be as free of germs as possible.  You can reduce the number of germs on you skin by washing with CHG (chlorahexidine gluconate) soap before surgery.  CHG is an antiseptic cleaner which kills germs and bonds with the  skin to continue killing germs even after washing.  Please DO NOT use if you have an allergy to CHG or antibacterial soaps.  If your skin becomes reddened/irritated stop using the CHG and inform your nurse when you arrive at Short Stay.  Do not shave (including legs and underarms) for at least 48 hours prior to the first CHG shower.  You may shave your face.  Please follow these instructions carefully:   1.  Shower with CHG Soap the night before surgery and the                                morning of Surgery.  2.  If you choose to wash your hair, wash your hair first as usual with your       normal shampoo.  3.  After you shampoo, rinse your hair and body thoroughly to remove the                      Shampoo.  4.  Use CHG as you would any other liquid soap.  You can apply chg directly       to the skin and wash gently with scrungie or a clean washcloth.  5.  Apply the CHG Soap to your body ONLY FROM THE NECK DOWN.        Do not use on open  wounds or open sores.  Avoid contact with your eyes,       ears, mouth and genitals (private parts).  Wash genitals (private parts)       with your normal soap.  6.  Wash thoroughly, paying special attention to the area where your surgery        will be performed.  7.  Thoroughly rinse your body with warm water from the neck down.  8.  DO NOT shower/wash with your normal soap after using and rinsing off       the CHG Soap.  9.  Pat yourself dry with a clean towel.            10.  Wear clean pajamas.            11.  Place clean sheets on your bed the night of your first shower and do not        sleep with pets.  Day of Surgery  Do not apply any lotions/deoderants the morning of surgery.  Please wear clean clothes to the hospital/surgery center.    Please read over the following fact sheets that you were given. MRSA Information and Surgical Site Infection Prevention

## 2017-04-12 ENCOUNTER — Other Ambulatory Visit: Payer: Self-pay | Admitting: Orthopedic Surgery

## 2017-04-12 MED ORDER — TRANEXAMIC ACID 1000 MG/10ML IV SOLN
2000.0000 mg | INTRAVENOUS | Status: DC
  Filled 2017-04-12: qty 20

## 2017-04-12 MED ORDER — LACTATED RINGERS IV SOLN: INTRAVENOUS | Status: DC

## 2017-04-12 MED ORDER — CEFAZOLIN SODIUM-DEXTROSE 2-4 GM/100ML-% IV SOLN
2.0000 g | INTRAVENOUS | Status: DC
  Filled 2017-04-12: qty 100

## 2017-04-12 NOTE — H&P (Signed)
TOTAL KNEE ADMISSION H&P  Patient is being admitted for right total knee arthroplasty.  Subjective:  Chief Complaint:right knee pain.  HPI: [address]ano, 67 y.o. female, has a history of pain and functional disability in the right knee due to arthritis and has failed non-surgical conservative treatments for greater than 12 weeks to includeNSAID's and/or analgesics, corticosteriod injections, viscosupplementation injections, use of assistive devices and activity modification.  Onset of symptoms was gradual, starting 1 years ago with gradually worsening course since that time. The patient noted no past surgery on the right knee(s).  Patient currently rates pain in the right knee(s) at 10 out of 10 with activity. Patient has night pain, worsening of pain with activity and weight bearing, pain that interferes with activities of daily living, pain with passive range of motion, crepitus and joint swelling.  Patient has evidence of periarticular osteophytes, joint subluxation and joint space narrowing by imaging studies.  There is no active infection.  Patient Active Problem List   Diagnosis Date Noted  . Degenerative arthritis of knee, bilateral 03/29/2016  . DDD (degenerative disc disease), cervical 07/13/2014  . Right knee DJD 11/10/2013  . History of breast cancer 05/25/2013  . Spinal stenosis of lumbar region with radiculopathy 05/31/2011  . Piriformis syndrome of right side 05/09/2011  . Lordosis of lumbar region 05/09/2011  . Knee pain 08/08/2010   Past Medical History:  Diagnosis Date  . Anxiety   . Breast cancer ([place]) 11/1997   RIGHT  . Broken arm 6/16   right  . Depression   . Hypercholesteremia   . Hypothyroidism   . Osteoarthritis   . Personal history of chemotherapy   . Personal history of radiation therapy   . Thyroid disease    hypothyroid    Past Surgical History:  Procedure Laterality Date  . BREAST LUMPECTOMY Right 11/1997  . MOUTH SURGERY    . NASAL SINUS SURGERY     . VAGINAL HYSTERECTOMY  12/03   LAVH-BSO    Current Facility-Administered Medications  Medication Dose Route Frequency Provider Last Rate Last Dose  . [START ON 04/15/2017] ceFAZolin (ANCEF) IVPB 2g/100 mL premix  2 g Intravenous To Joellyn Haff, MD      . Derrill Memo ON 04/15/2017] lactated ringers infusion   Intravenous Continuous Frederik Pear, MD      . Derrill Memo ON 04/15/2017] tranexamic acid (CYKLOKAPRON) 2,000 mg in sodium chloride 0.9 % 50 mL Topical Application  2,778 mg Topical To OR Frederik Pear, MD       Current Outpatient Medications  Medication Sig Dispense Refill Last Dose  . ALPRAZolam (XANAX) 0.5 MG tablet Take 0.25-0.5 mg by mouth 2 (two) times daily as needed for anxiety.     . Biotin 10000 MCG TABS Take 1 tablet by mouth daily.    Taking  . Calcium Carb-Cholecalciferol (CALCIUM 600 + D PO) Take 1 tablet by mouth daily.     . Coenzyme Q10 (COQ10) 400 MG CAPS Take 1 capsule by mouth daily.     Marland Kitchen denosumab (PROLIA) 60 MG/ML SOLN injection Inject 60 mg into the skin every 6 (six) months. Administer in upper arm, thigh, or abdomen   Taking  . Iron-Vitamin C (IRON 100/C PO) Take 1 tablet by mouth every other day.    Taking  . levothyroxine (SYNTHROID, LEVOTHROID) 75 MCG tablet Take 75 mcg by mouth daily before breakfast.    Taking  . Magnesium Malate 1250 (141.7 Mg) MG TABS Take 2 tablets by mouth at  bedtime.    Taking  . Multiple Vitamins-Minerals (MULTIVITAMIN PO) Take 1 tablet by mouth daily.    Taking  . Omega-3 Fatty Acids (FISH OIL) 1200 MG CAPS Take 1 capsule by mouth daily.    Taking  . simvastatin (ZOCOR) 20 MG tablet Take 20 mg by mouth every evening.  2 Taking  . temazepam (RESTORIL) 15 MG capsule Take 15 mg by mouth at bedtime as needed for sleep.   0 Taking  . traMADol (ULTRAM) 50 MG tablet TAKE 1 TABLET (50 MG TOTAL) EVERY 12 (TWELVE) HOURS AS NEEDED BY MOUTH. 60 tablet 0   . Vitamin D, Ergocalciferol, (DRISDOL) 50000 units CAPS capsule TAKE 1 CAPSULE (50,000 UNITS  TOTAL) BY MOUTH EVERY 7 (SEVEN) DAYS. 12 capsule 0    Allergies  Allergen Reactions  . Gabapentin Other (See Comments)    Gave pt migraine    Social History   Tobacco Use  . Smoking status: Never Smoker  . Smokeless tobacco: Never Used  Substance Use Topics  . Alcohol use: Yes    Alcohol/week: 3.6 - 4.8 oz    Types: 6 - 8 Standard drinks or equivalent per week    Family History  Problem Relation Age of Onset  . Rheum arthritis Mother   . Non-Hodgkin's lymphoma Sister      Review of Systems  Constitutional: Negative.   HENT: Negative.   Eyes: Negative.   Respiratory: Negative.   Cardiovascular: Negative.   Gastrointestinal: Negative.   Genitourinary: Negative.   Musculoskeletal: Positive for joint pain.  Skin: Negative.   Neurological: Negative.   Endo/Heme/Allergies: Negative.   Psychiatric/Behavioral: Negative.     Objective:  Physical Exam  Constitutional: She is oriented to person, place, and time. She appears well-developed and well-nourished.  HENT:  Head: Normocephalic and atraumatic.  Eyes: Pupils are equal, round, and reactive to light.  Neck: Normal range of motion. Neck supple.  Cardiovascular: Intact distal pulses.  Respiratory: Effort normal.  Musculoskeletal: She exhibits tenderness.  Both knees continue to be quite tender along the joint lines, both have 5 flexion contractures and pain when you flex to 125.  Neurological: She is alert and oriented to person, place, and time.  Skin: Skin is warm and dry.  Psychiatric: She has a normal mood and affect. Her behavior is normal. Judgment and thought content normal.    Vital signs in last 24 hours:    Labs:   Estimated body mass index is 22.96 kg/m as calculated from the following:   Height as of 04/09/17: 5\' 5"  (1.651 m).   Weight as of 04/09/17: 62.6 kg (138 lb).   Imaging Review Plain radiographs demonstrate AP, Rosenberg, lateral and sunrise x-rays show bone-on-bone arthritis to both  knees, worse on the right than the left with peripheral osteophytes early subluxation of the tibias lateral to the femurs.  Assessment/Plan:  End stage arthritis, right knee   The patient history, physical examination, clinical judgment of the provider and imaging studies are consistent with end stage degenerative joint disease of the right knee(s) and total knee arthroplasty is deemed medically necessary. The treatment options including medical management, injection therapy arthroscopy and arthroplasty were discussed at length. The risks and benefits of total knee arthroplasty were presented and reviewed. The risks due to aseptic loosening, infection, stiffness, patella tracking problems, thromboembolic complications and other imponderables were discussed. The patient acknowledged the explanation, agreed to proceed with the plan and consent was signed. Patient is being admitted for inpatient treatment for  surgery, pain control, PT, OT, prophylactic antibiotics, VTE prophylaxis, progressive ambulation and ADL's and discharge planning.  The patient is planning to be discharged home with home health services.

## 2017-04-12 NOTE — H&P (View-Only) (Signed)
TOTAL KNEE ADMISSION H&P  Patient is being admitted for right total knee arthroplasty.  Subjective:  Chief Complaint:right knee pain.  HPI: 51 [address], 67 y.o. female, has a history of pain and functional disability in the right knee due to arthritis and has failed non-surgical conservative treatments for greater than 12 weeks to includeNSAID's and/or analgesics, corticosteriod injections, viscosupplementation injections, use of assistive devices and activity modification.  Onset of symptoms was gradual, starting 1 years ago with gradually worsening course since that time. The patient noted no past surgery on the right knee(s).  Patient currently rates pain in the right knee(s) at 10 out of 10 with activity. Patient has night pain, worsening of pain with activity and weight bearing, pain that interferes with activities of daily living, pain with passive range of motion, crepitus and joint swelling.  Patient has evidence of periarticular osteophytes, joint subluxation and joint space narrowing by imaging studies.  There is no active infection.  Patient Active Problem List   Diagnosis Date Noted  . Degenerative arthritis of knee, bilateral 03/29/2016  . DDD (degenerative disc disease), cervical 07/13/2014  . Right knee DJD 11/10/2013  . History of breast cancer 05/25/2013  . Spinal stenosis of lumbar region with radiculopathy 05/31/2011  . Piriformis syndrome of right side 05/09/2011  . Lordosis of lumbar region 05/09/2011  . Knee pain 08/08/2010   Past Medical History:  Diagnosis Date  . Anxiety   . Breast cancer ([place]) 11/1997   RIGHT  . Broken arm 6/16   right  . Depression   . Hypercholesteremia   . Hypothyroidism   . Osteoarthritis   . Personal history of chemotherapy   . Personal history of radiation therapy   . Thyroid disease    hypothyroid    Past Surgical History:  Procedure Laterality Date  . BREAST LUMPECTOMY Right 11/1997  . MOUTH SURGERY    . NASAL SINUS SURGERY     . VAGINAL HYSTERECTOMY  12/03   [facility]-BSO    Current Facility-Administered Medications  Medication Dose Route Frequency Provider Last Rate Last Dose  . [START ON 04/15/2017] ceFAZolin (ANCEF) IVPB 2g/100 mL premix  2 g Intravenous To Joellyn Haff, MD      . Derrill Memo ON 04/15/2017] lactated ringers infusion   Intravenous Continuous Frederik Pear, MD      . Derrill Memo ON 04/15/2017] tranexamic acid (CYKLOKAPRON) 2,000 mg in sodium chloride 0.9 % 50 mL Topical Application  9,678 mg Topical To OR Frederik Pear, MD       Current Outpatient Medications  Medication Sig Dispense Refill Last Dose  . ALPRAZolam (XANAX) 0.5 MG tablet Take 0.25-0.5 mg by mouth 2 (two) times daily as needed for anxiety.     . Biotin 10000 MCG TABS Take 1 tablet by mouth daily.    Taking  . Calcium Carb-Cholecalciferol (CALCIUM 600 + D PO) Take 1 tablet by mouth daily.     . Coenzyme Q10 (COQ10) 400 MG CAPS Take 1 capsule by mouth daily.     Marland Kitchen denosumab (PROLIA) 60 MG/ML SOLN injection Inject 60 mg into the skin every 6 (six) months. Administer in upper arm, thigh, or abdomen   Taking  . Iron-Vitamin C (IRON 100/C PO) Take 1 tablet by mouth every other day.    Taking  . levothyroxine (SYNTHROID, LEVOTHROID) 75 MCG tablet Take 75 mcg by mouth daily before breakfast.    Taking  . Magnesium Malate 1250 (141.7 Mg) MG TABS Take 2 tablets by mouth at  bedtime.    Taking  . Multiple Vitamins-Minerals (MULTIVITAMIN PO) Take 1 tablet by mouth daily.    Taking  . Omega-3 Fatty Acids (FISH OIL) 1200 MG CAPS Take 1 capsule by mouth daily.    Taking  . simvastatin (ZOCOR) 20 MG tablet Take 20 mg by mouth every evening.  2 Taking  . temazepam (RESTORIL) 15 MG capsule Take 15 mg by mouth at bedtime as needed for sleep.   0 Taking  . traMADol (ULTRAM) 50 MG tablet TAKE 1 TABLET (50 MG TOTAL) EVERY 12 (TWELVE) HOURS AS NEEDED BY MOUTH. 60 tablet 0   . Vitamin D, Ergocalciferol, (DRISDOL) 50000 units CAPS capsule TAKE 1 CAPSULE (50,000 UNITS  TOTAL) BY MOUTH EVERY 7 (SEVEN) DAYS. 12 capsule 0    Allergies  Allergen Reactions  . Gabapentin Other (See Comments)    Gave pt migraine    Social History   Tobacco Use  . Smoking status: Never Smoker  . Smokeless tobacco: Never Used  Substance Use Topics  . Alcohol use: Yes    Alcohol/week: 3.6 - 4.8 oz    Types: 6 - 8 Standard drinks or equivalent per week    Family History  Problem Relation Age of Onset  . Rheum arthritis Mother   . Non-Hodgkin's lymphoma Sister      Review of Systems  Constitutional: Negative.   HENT: Negative.   Eyes: Negative.   Respiratory: Negative.   Cardiovascular: Negative.   Gastrointestinal: Negative.   Genitourinary: Negative.   Musculoskeletal: Positive for joint pain.  Skin: Negative.   Neurological: Negative.   Endo/Heme/Allergies: Negative.   Psychiatric/Behavioral: Negative.     Objective:  Physical Exam  Constitutional: She is oriented to person, place, and time. She appears well-developed and well-nourished.  HENT:  Head: Normocephalic and atraumatic.  Eyes: Pupils are equal, round, and reactive to light.  Neck: Normal range of motion. Neck supple.  Cardiovascular: Intact distal pulses.  Respiratory: Effort normal.  Musculoskeletal: She exhibits tenderness.  Both knees continue to be quite tender along the joint lines, both have 5 flexion contractures and pain when you flex to 125.  Neurological: She is alert and oriented to person, place, and time.  Skin: Skin is warm and dry.  Psychiatric: She has a normal mood and affect. Her behavior is normal. Judgment and thought content normal.    Vital signs in last 24 hours:    Labs:   Estimated body mass index is 22.96 kg/m as calculated from the following:   Height as of 04/09/17: 5\' 5"  (1.651 m).   Weight as of 04/09/17: 62.6 kg (138 lb).   Imaging Review Plain radiographs demonstrate AP, Rosenberg, lateral and sunrise x-rays show bone-on-bone arthritis to both  knees, worse on the right than the left with peripheral osteophytes early subluxation of the tibias lateral to the femurs.  Assessment/Plan:  End stage arthritis, right knee   The patient history, physical examination, clinical judgment of the provider and imaging studies are consistent with end stage degenerative joint disease of the right knee(s) and total knee arthroplasty is deemed medically necessary. The treatment options including medical management, injection therapy arthroscopy and arthroplasty were discussed at length. The risks and benefits of total knee arthroplasty were presented and reviewed. The risks due to aseptic loosening, infection, stiffness, patella tracking problems, thromboembolic complications and other imponderables were discussed. The patient acknowledged the explanation, agreed to proceed with the plan and consent was signed. Patient is being admitted for inpatient treatment for  surgery, pain control, PT, OT, prophylactic antibiotics, VTE prophylaxis, progressive ambulation and ADL's and discharge planning.  The patient is planning to be discharged home with home health services.

## 2017-04-15 ENCOUNTER — Ambulatory Visit (HOSPITAL_COMMUNITY)
Admission: RE | Admit: 2017-04-15 | Discharge: 2017-04-15 | Disposition: A | Discharge status: Home / Self Care | Payer: Medicare Other | Source: Ambulatory Visit | Attending: Orthopedic Surgery | Admitting: Orthopedic Surgery

## 2017-04-15 ENCOUNTER — Encounter (HOSPITAL_COMMUNITY): Payer: Self-pay | Admitting: Anesthesiology

## 2017-04-15 ENCOUNTER — Encounter (HOSPITAL_COMMUNITY)
Admission: RE | Disposition: A | Discharge status: Home / Self Care | Payer: Self-pay | Source: Ambulatory Visit | Attending: Orthopedic Surgery

## 2017-04-15 DIAGNOSIS — M1711 Unilateral primary osteoarthritis, right knee: Secondary | ICD-10-CM

## 2017-04-15 SURGERY — Surgical Case
Laterality: Right

## 2017-04-15 MED ORDER — FENTANYL CITRATE (PF) 250 MCG/5ML IJ SOLN
INTRAMUSCULAR | Status: AC
  Filled 2017-04-15: qty 5

## 2017-04-15 MED ORDER — BUPIVACAINE LIPOSOME 1.3 % IJ SUSP
20.0000 mL | Freq: Once | INTRAMUSCULAR | Status: DC
  Filled 2017-04-15: qty 20

## 2017-04-15 MED ORDER — MIDAZOLAM HCL 2 MG/2ML IJ SOLN
INTRAMUSCULAR | Status: AC
  Filled 2017-04-15: qty 2

## 2017-04-15 MED ORDER — CHLORHEXIDINE GLUCONATE 4 % EX LIQD: 60.0000 mL | Freq: Once | CUTANEOUS | Status: DC

## 2017-04-15 MED ORDER — TRANEXAMIC ACID 1000 MG/10ML IV SOLN
1000.0000 mg | INTRAVENOUS | Status: DC
  Filled 2017-04-15: qty 10

## 2017-04-15 MED ORDER — FENTANYL CITRATE (PF) 100 MCG/2ML IJ SOLN
INTRAMUSCULAR | Status: AC
  Filled 2017-04-15: qty 2

## 2017-04-15 NOTE — Progress Notes (Signed)
Patient's IV removed and site clean, dry, and intact.    Patient's procedure canceled due to insurance.

## 2017-04-15 NOTE — OR Nursing (Signed)
1145:  Case cancelled due to insurance snafu. No material opened, cancelled in preference; pharmacy items returned unused. Pt will be seen by Dr. Mayer Camel.

## 2017-04-15 NOTE — Anesthesia Preprocedure Evaluation (Deleted)
Anesthesia Evaluation  Patient identified by MRN, date of birth, ID band Patient awake    Reviewed: Allergy & Precautions, NPO status , Patient's Chart, lab work & pertinent test results  Airway Mallampati: I  TM Distance: >3 FB Neck ROM: Full    Dental   Pulmonary    Pulmonary exam normal        Cardiovascular Normal cardiovascular exam     Neuro/Psych Anxiety Depression    GI/Hepatic   Endo/Other    Renal/GU      Musculoskeletal   Abdominal   Peds  Hematology   Anesthesia Other Findings   Reproductive/Obstetrics                             Anesthesia Physical Anesthesia Plan  ASA: II  Anesthesia Plan: Spinal   Post-op Pain Management:  Regional for Post-op pain   Induction:   PONV Risk Score and Plan: 2 and Ondansetron and Treatment may vary due to age or medical condition  Airway Management Planned: Simple Face Mask  Additional Equipment:   Intra-op Plan:   Post-operative Plan:   Informed Consent: I have reviewed the patients History and Physical, chart, labs and discussed the procedure including the risks, benefits and alternatives for the proposed anesthesia with the patient or authorized representative who has indicated his/her understanding and acceptance.     Plan Discussed with: CRNA and Surgeon  Anesthesia Plan Comments:         Anesthesia Quick Evaluation

## 2017-04-22 ENCOUNTER — Inpatient Hospital Stay (HOSPITAL_COMMUNITY): Payer: Medicare Other | Admitting: Anesthesiology

## 2017-04-22 ENCOUNTER — Other Ambulatory Visit: Payer: Self-pay

## 2017-04-22 ENCOUNTER — Encounter (HOSPITAL_COMMUNITY): Payer: Self-pay

## 2017-04-22 ENCOUNTER — Encounter (HOSPITAL_COMMUNITY)
Admission: RE | Disposition: A | Discharge status: Home Health | Payer: Self-pay | Source: Ambulatory Visit | Attending: Orthopedic Surgery

## 2017-04-22 ENCOUNTER — Inpatient Hospital Stay (HOSPITAL_COMMUNITY)
Admission: RE | Admit: 2017-04-22 | Discharge: 2017-04-24 | DRG: 470 | Disposition: A | Discharge status: Home Health | Payer: Medicare Other | Source: Ambulatory Visit | Attending: Orthopedic Surgery | Admitting: Orthopedic Surgery

## 2017-04-22 DIAGNOSIS — Z79899 Other long term (current) drug therapy: Secondary | ICD-10-CM

## 2017-04-22 DIAGNOSIS — F419 Anxiety disorder, unspecified: Secondary | ICD-10-CM | POA: Diagnosis present

## 2017-04-22 DIAGNOSIS — Z923 Personal history of irradiation: Secondary | ICD-10-CM

## 2017-04-22 DIAGNOSIS — Z853 Personal history of malignant neoplasm of breast: Secondary | ICD-10-CM | POA: Diagnosis not present

## 2017-04-22 DIAGNOSIS — E7800 Pure hypercholesterolemia, unspecified: Secondary | ICD-10-CM | POA: Diagnosis present

## 2017-04-22 DIAGNOSIS — F329 Major depressive disorder, single episode, unspecified: Secondary | ICD-10-CM | POA: Diagnosis present

## 2017-04-22 DIAGNOSIS — D62 Acute posthemorrhagic anemia: Secondary | ICD-10-CM | POA: Diagnosis not present

## 2017-04-22 DIAGNOSIS — M1711 Unilateral primary osteoarthritis, right knee: Secondary | ICD-10-CM | POA: Diagnosis present

## 2017-04-22 DIAGNOSIS — E039 Hypothyroidism, unspecified: Secondary | ICD-10-CM | POA: Diagnosis present

## 2017-04-22 DIAGNOSIS — M25561 Pain in right knee: Secondary | ICD-10-CM | POA: Diagnosis present

## 2017-04-22 DIAGNOSIS — Z9221 Personal history of antineoplastic chemotherapy: Secondary | ICD-10-CM | POA: Diagnosis not present

## 2017-04-22 DIAGNOSIS — Z90710 Acquired absence of both cervix and uterus: Secondary | ICD-10-CM | POA: Diagnosis not present

## 2017-04-22 HISTORY — PX: TOTAL KNEE ARTHROPLASTY: SHX125

## 2017-04-22 HISTORY — DX: Malignant neoplasm of unspecified site of right female breast: C50.911

## 2017-04-22 SURGERY — ARTHROPLASTY, KNEE, TOTAL
Anesthesia: Spinal | Laterality: Right

## 2017-04-22 MED ORDER — OXYCODONE HCL 5 MG PO TABS
10.0000 mg | ORAL_TABLET | ORAL | Status: DC | PRN
  Administered 2017-04-23 – 2017-04-24 (×7): 10 mg via ORAL
  Filled 2017-04-22 (×7): qty 2

## 2017-04-22 MED ORDER — BUPIVACAINE-EPINEPHRINE (PF) 0.5% -1:200000 IJ SOLN
INTRAMUSCULAR | Status: DC | PRN
  Administered 2017-04-22: 30 mL via PERINEURAL

## 2017-04-22 MED ORDER — CEFAZOLIN SODIUM-DEXTROSE 2-4 GM/100ML-% IV SOLN
INTRAVENOUS | Status: AC
  Filled 2017-04-22: qty 100

## 2017-04-22 MED ORDER — TRANEXAMIC ACID 1000 MG/10ML IV SOLN
1000.0000 mg | INTRAVENOUS | Status: AC
  Administered 2017-04-22: 1000 mg via INTRAVENOUS
  Filled 2017-04-22: qty 10

## 2017-04-22 MED ORDER — FENTANYL CITRATE (PF) 100 MCG/2ML IJ SOLN
50.0000 ug | Freq: Once | INTRAMUSCULAR | Status: AC
  Administered 2017-04-22: 50 ug via INTRAVENOUS

## 2017-04-22 MED ORDER — TRANEXAMIC ACID 1000 MG/10ML IV SOLN
INTRAVENOUS | Status: AC | PRN
  Administered 2017-04-22: 2000 mg via TOPICAL

## 2017-04-22 MED ORDER — SODIUM CHLORIDE 0.9 % IJ SOLN
INTRAMUSCULAR | Status: DC | PRN
  Administered 2017-04-22 (×2): 10 mL

## 2017-04-22 MED ORDER — ONDANSETRON HCL 4 MG/2ML IJ SOLN: 4.0000 mg | Freq: Once | INTRAMUSCULAR | Status: DC | PRN

## 2017-04-22 MED ORDER — MIDAZOLAM HCL 2 MG/2ML IJ SOLN
INTRAMUSCULAR | Status: AC
  Administered 2017-04-22: 2 mg via INTRAVENOUS
  Filled 2017-04-22: qty 2

## 2017-04-22 MED ORDER — MIDAZOLAM HCL 5 MG/5ML IJ SOLN
INTRAMUSCULAR | Status: DC | PRN
  Administered 2017-04-22: 2 mg via INTRAVENOUS

## 2017-04-22 MED ORDER — MIDAZOLAM HCL 2 MG/2ML IJ SOLN
2.0000 mg | Freq: Once | INTRAMUSCULAR | Status: AC
  Administered 2017-04-22: 2 mg via INTRAVENOUS

## 2017-04-22 MED ORDER — MENTHOL 3 MG MT LOZG: 1.0000 | LOZENGE | OROMUCOSAL | Status: DC | PRN

## 2017-04-22 MED ORDER — DOCUSATE SODIUM 100 MG PO CAPS
100.0000 mg | ORAL_CAPSULE | Freq: Two times a day (BID) | ORAL | Status: DC
  Administered 2017-04-22 – 2017-04-24 (×4): 100 mg via ORAL
  Filled 2017-04-22 (×4): qty 1

## 2017-04-22 MED ORDER — ACETAMINOPHEN 650 MG RE SUPP: 650.0000 mg | RECTAL | Status: DC | PRN

## 2017-04-22 MED ORDER — METOCLOPRAMIDE HCL 5 MG/ML IJ SOLN: 5.0000 mg | Freq: Three times a day (TID) | INTRAMUSCULAR | Status: DC | PRN

## 2017-04-22 MED ORDER — KCL IN DEXTROSE-NACL 20-5-0.45 MEQ/L-%-% IV SOLN
INTRAVENOUS | Status: DC
  Administered 2017-04-22: 17:00:00 via INTRAVENOUS
  Filled 2017-04-22: qty 1000

## 2017-04-22 MED ORDER — PHENOL 1.4 % MT LIQD: 1.0000 | OROMUCOSAL | Status: DC | PRN

## 2017-04-22 MED ORDER — OXYCODONE-ACETAMINOPHEN 5-325 MG PO TABS: 1.0000 | ORAL_TABLET | ORAL | 0 refills | Status: DC | PRN

## 2017-04-22 MED ORDER — FENTANYL CITRATE (PF) 100 MCG/2ML IJ SOLN
INTRAMUSCULAR | Status: AC
  Filled 2017-04-22: qty 2

## 2017-04-22 MED ORDER — EPHEDRINE 5 MG/ML INJ
INTRAVENOUS | Status: AC
  Filled 2017-04-22: qty 20

## 2017-04-22 MED ORDER — ASPIRIN EC 325 MG PO TBEC: 325.0000 mg | DELAYED_RELEASE_TABLET | Freq: Two times a day (BID) | ORAL | 0 refills | Status: DC

## 2017-04-22 MED ORDER — TIZANIDINE HCL 2 MG PO TABS: 2.0000 mg | ORAL_TABLET | Freq: Four times a day (QID) | ORAL | 0 refills | Status: DC | PRN

## 2017-04-22 MED ORDER — OXYCODONE HCL 5 MG PO TABS
5.0000 mg | ORAL_TABLET | Freq: Once | ORAL | Status: AC | PRN
  Administered 2017-04-22: 5 mg via ORAL

## 2017-04-22 MED ORDER — OXYCODONE HCL 5 MG PO TABS
5.0000 mg | ORAL_TABLET | ORAL | Status: DC | PRN
  Administered 2017-04-22: 5 mg via ORAL
  Filled 2017-04-22: qty 1

## 2017-04-22 MED ORDER — OXYCODONE HCL 5 MG PO TABS
ORAL_TABLET | ORAL | Status: AC
  Administered 2017-04-22: 5 mg via ORAL
  Filled 2017-04-22: qty 1

## 2017-04-22 MED ORDER — BUPIVACAINE HCL (PF) 0.25 % IJ SOLN
INTRAMUSCULAR | Status: DC | PRN
  Administered 2017-04-22: 50 mL

## 2017-04-22 MED ORDER — ONDANSETRON HCL 4 MG/2ML IJ SOLN
4.0000 mg | Freq: Four times a day (QID) | INTRAMUSCULAR | Status: DC | PRN
  Administered 2017-04-24: 4 mg via INTRAVENOUS
  Filled 2017-04-22: qty 2

## 2017-04-22 MED ORDER — EPINEPHRINE PF 1 MG/ML IJ SOLN
INTRAMUSCULAR | Status: DC | PRN
  Administered 2017-04-22: .3 mL

## 2017-04-22 MED ORDER — FENTANYL CITRATE (PF) 250 MCG/5ML IJ SOLN
INTRAMUSCULAR | Status: AC
Start: 2017-04-22 — End: 2017-04-22
  Filled 2017-04-22: qty 5

## 2017-04-22 MED ORDER — PROPOFOL 500 MG/50ML IV EMUL
INTRAVENOUS | Status: DC | PRN
  Administered 2017-04-22: 80 ug/kg/min via INTRAVENOUS

## 2017-04-22 MED ORDER — BUPIVACAINE LIPOSOME 1.3 % IJ SUSP
INTRAMUSCULAR | Status: DC | PRN
  Administered 2017-04-22: 20 mL

## 2017-04-22 MED ORDER — ONDANSETRON HCL 4 MG PO TABS: 4.0000 mg | ORAL_TABLET | Freq: Four times a day (QID) | ORAL | Status: DC | PRN

## 2017-04-22 MED ORDER — DIPHENHYDRAMINE HCL 12.5 MG/5ML PO ELIX: 12.5000 mg | ORAL_SOLUTION | ORAL | Status: DC | PRN

## 2017-04-22 MED ORDER — FENTANYL CITRATE (PF) 100 MCG/2ML IJ SOLN
INTRAMUSCULAR | Status: AC
  Administered 2017-04-22: 50 ug via INTRAVENOUS
  Filled 2017-04-22: qty 2

## 2017-04-22 MED ORDER — LACTATED RINGERS IV SOLN
INTRAVENOUS | Status: DC
  Administered 2017-04-22: 10:00:00 via INTRAVENOUS

## 2017-04-22 MED ORDER — LIDOCAINE HCL (CARDIAC) 20 MG/ML IV SOLN
INTRAVENOUS | Status: DC | PRN
  Administered 2017-04-22: 60 mg via INTRAVENOUS

## 2017-04-22 MED ORDER — BUPIVACAINE HCL (PF) 0.25 % IJ SOLN
INTRAMUSCULAR | Status: AC
  Filled 2017-04-22: qty 60

## 2017-04-22 MED ORDER — ASPIRIN EC 325 MG PO TBEC
325.0000 mg | DELAYED_RELEASE_TABLET | Freq: Every day | ORAL | Status: DC
  Administered 2017-04-23 – 2017-04-24 (×2): 325 mg via ORAL
  Filled 2017-04-22 (×2): qty 1

## 2017-04-22 MED ORDER — BUPIVACAINE LIPOSOME 1.3 % IJ SUSP
20.0000 mL | Freq: Once | INTRAMUSCULAR | Status: DC
  Filled 2017-04-22: qty 20

## 2017-04-22 MED ORDER — ACETAMINOPHEN 325 MG PO TABS
650.0000 mg | ORAL_TABLET | ORAL | Status: DC | PRN
  Administered 2017-04-22: 650 mg via ORAL
  Filled 2017-04-22: qty 2

## 2017-04-22 MED ORDER — FENTANYL CITRATE (PF) 100 MCG/2ML IJ SOLN
25.0000 ug | INTRAMUSCULAR | Status: DC | PRN
  Administered 2017-04-22: 50 ug via INTRAVENOUS

## 2017-04-22 MED ORDER — CEFAZOLIN SODIUM-DEXTROSE 2-3 GM-%(50ML) IV SOLR
INTRAVENOUS | Status: DC | PRN
  Administered 2017-04-22: 2 g via INTRAVENOUS

## 2017-04-22 MED ORDER — PROPOFOL 1000 MG/100ML IV EMUL
INTRAVENOUS | Status: AC
  Filled 2017-04-22: qty 100

## 2017-04-22 MED ORDER — LEVOTHYROXINE SODIUM 75 MCG PO TABS
75.0000 ug | ORAL_TABLET | Freq: Every day | ORAL | Status: DC
  Administered 2017-04-23 – 2017-04-24 (×2): 75 ug via ORAL
  Filled 2017-04-22 (×2): qty 1

## 2017-04-22 MED ORDER — ALUM & MAG HYDROXIDE-SIMETH 200-200-20 MG/5ML PO SUSP: 30.0000 mL | ORAL | Status: DC | PRN

## 2017-04-22 MED ORDER — FLEET ENEMA 7-19 GM/118ML RE ENEM: 1.0000 | ENEMA | Freq: Once | RECTAL | Status: DC | PRN

## 2017-04-22 MED ORDER — LIDOCAINE 2% (20 MG/ML) 5 ML SYRINGE
INTRAMUSCULAR | Status: AC
  Filled 2017-04-22: qty 5

## 2017-04-22 MED ORDER — 0.9 % SODIUM CHLORIDE (POUR BTL) OPTIME
TOPICAL | Status: DC | PRN
  Administered 2017-04-22: 1000 mL

## 2017-04-22 MED ORDER — OXYCODONE HCL 5 MG/5ML PO SOLN: 5.0000 mg | Freq: Once | ORAL | Status: AC | PRN

## 2017-04-22 MED ORDER — SODIUM CHLORIDE 0.9 % IR SOLN
Status: DC | PRN
  Administered 2017-04-22: 3000 mL

## 2017-04-22 MED ORDER — TRANEXAMIC ACID 1000 MG/10ML IV SOLN
1000.0000 mg | Freq: Once | INTRAVENOUS | Status: AC
  Administered 2017-04-22: 1000 mg via INTRAVENOUS
  Filled 2017-04-22: qty 10

## 2017-04-22 MED ORDER — EPHEDRINE SULFATE-NACL 50-0.9 MG/10ML-% IV SOSY
PREFILLED_SYRINGE | INTRAVENOUS | Status: DC | PRN
  Administered 2017-04-22: 5 mg via INTRAVENOUS
  Administered 2017-04-22: 10 mg via INTRAVENOUS

## 2017-04-22 MED ORDER — MIDAZOLAM HCL 2 MG/2ML IJ SOLN
INTRAMUSCULAR | Status: AC
  Filled 2017-04-22: qty 2

## 2017-04-22 MED ORDER — EPINEPHRINE PF 1 MG/ML IJ SOLN
INTRAMUSCULAR | Status: AC
  Filled 2017-04-22: qty 1

## 2017-04-22 MED ORDER — METOCLOPRAMIDE HCL 5 MG PO TABS: 5.0000 mg | ORAL_TABLET | Freq: Three times a day (TID) | ORAL | Status: DC | PRN

## 2017-04-22 MED ORDER — SENNOSIDES-DOCUSATE SODIUM 8.6-50 MG PO TABS: 1.0000 | ORAL_TABLET | Freq: Every evening | ORAL | Status: DC | PRN

## 2017-04-22 MED ORDER — PHENYLEPHRINE HCL 10 MG/ML IJ SOLN
INTRAVENOUS | Status: DC | PRN
  Administered 2017-04-22: 40 ug/min via INTRAVENOUS

## 2017-04-22 MED ORDER — FENTANYL CITRATE (PF) 250 MCG/5ML IJ SOLN
INTRAMUSCULAR | Status: DC | PRN
  Administered 2017-04-22: 100 ug via INTRAVENOUS

## 2017-04-22 MED ORDER — METHOCARBAMOL 500 MG PO TABS
500.0000 mg | ORAL_TABLET | Freq: Four times a day (QID) | ORAL | Status: DC | PRN
  Administered 2017-04-22 – 2017-04-24 (×5): 500 mg via ORAL
  Filled 2017-04-22 (×6): qty 1

## 2017-04-22 MED ORDER — TEMAZEPAM 15 MG PO CAPS
15.0000 mg | ORAL_CAPSULE | Freq: Every evening | ORAL | Status: DC | PRN
  Administered 2017-04-22: 15 mg via ORAL
  Filled 2017-04-22: qty 1

## 2017-04-22 MED ORDER — HYDROMORPHONE HCL 1 MG/ML IJ SOLN
0.5000 mg | INTRAMUSCULAR | Status: DC | PRN
  Administered 2017-04-22 – 2017-04-23 (×3): 0.5 mg via INTRAVENOUS
  Filled 2017-04-22 (×4): qty 1

## 2017-04-22 MED ORDER — ONDANSETRON HCL 4 MG/2ML IJ SOLN
INTRAMUSCULAR | Status: DC | PRN
  Administered 2017-04-22: 4 mg via INTRAVENOUS

## 2017-04-22 MED ORDER — ALPRAZOLAM 0.25 MG PO TABS
0.2500 mg | ORAL_TABLET | Freq: Two times a day (BID) | ORAL | Status: DC | PRN
  Administered 2017-04-24: 0.5 mg via ORAL
  Filled 2017-04-22: qty 2

## 2017-04-22 MED ORDER — METHOCARBAMOL 1000 MG/10ML IJ SOLN
500.0000 mg | Freq: Four times a day (QID) | INTRAMUSCULAR | Status: DC | PRN
  Filled 2017-04-22: qty 5

## 2017-04-22 MED ORDER — CELECOXIB 200 MG PO CAPS
200.0000 mg | ORAL_CAPSULE | Freq: Two times a day (BID) | ORAL | Status: DC
  Administered 2017-04-22 – 2017-04-24 (×4): 200 mg via ORAL
  Filled 2017-04-22 (×4): qty 1

## 2017-04-22 MED ORDER — BUPIVACAINE IN DEXTROSE 0.75-8.25 % IT SOLN
INTRATHECAL | Status: DC | PRN
  Administered 2017-04-22: 1.8 mL via INTRATHECAL

## 2017-04-22 MED ORDER — TRANEXAMIC ACID 1000 MG/10ML IV SOLN
2000.0000 mg | Freq: Once | INTRAVENOUS | Status: DC
  Filled 2017-04-22: qty 20

## 2017-04-22 MED ORDER — ONDANSETRON HCL 4 MG/2ML IJ SOLN
INTRAMUSCULAR | Status: AC
  Filled 2017-04-22: qty 2

## 2017-04-22 MED ORDER — BISACODYL 5 MG PO TBEC: 5.0000 mg | DELAYED_RELEASE_TABLET | Freq: Every day | ORAL | Status: DC | PRN

## 2017-04-22 SURGICAL SUPPLY — 52 items
BANDAGE ACE 6X5 VEL STRL LF (GAUZE/BANDAGES/DRESSINGS) ×2 IMPLANT
BANDAGE ESMARK 6X9 LF (GAUZE/BANDAGES/DRESSINGS) ×1 IMPLANT
BLADE SAG 18X100X1.27 (BLADE) ×2 IMPLANT
BLADE SAGITTAL 13X1.27X60 (BLADE) IMPLANT
BLADE SAW SGTL 13X75X1.27 (BLADE) IMPLANT
BNDG ELASTIC 6X10 VLCR STRL LF (GAUZE/BANDAGES/DRESSINGS) ×2 IMPLANT
BNDG ESMARK 6X9 LF (GAUZE/BANDAGES/DRESSINGS) ×2
BOWL SMART MIX CTS (DISPOSABLE) ×2 IMPLANT
CAPT KNEE TOTAL 3 ATTUNE ×2 IMPLANT
CEMENT HV SMART SET (Cement) ×4 IMPLANT
COVER SURGICAL LIGHT HANDLE (MISCELLANEOUS) ×2 IMPLANT
CUFF TOURNIQUET SINGLE 34IN LL (TOURNIQUET CUFF) ×2 IMPLANT
CUFF TOURNIQUET SINGLE 44IN (TOURNIQUET CUFF) IMPLANT
DRAPE EXTREMITY T 121X128X90 (DRAPE) ×2 IMPLANT
DRAPE U-SHAPE 47X51 STRL (DRAPES) ×2 IMPLANT
DRSG AQUACEL AG ADV 3.5X 6 (GAUZE/BANDAGES/DRESSINGS) ×2 IMPLANT
DRSG AQUACEL AG ADV 3.5X10 (GAUZE/BANDAGES/DRESSINGS) ×2 IMPLANT
DURAPREP 26ML APPLICATOR (WOUND CARE) ×2 IMPLANT
ELECT REM PT RETURN 9FT ADLT (ELECTROSURGICAL) ×2
ELECTRODE REM PT RTRN 9FT ADLT (ELECTROSURGICAL) ×1 IMPLANT
GLOVE BIO SURGEON STRL SZ7.5 (GLOVE) ×2 IMPLANT
GLOVE BIO SURGEON STRL SZ8.5 (GLOVE) ×2 IMPLANT
GLOVE BIOGEL PI IND STRL 8 (GLOVE) ×1 IMPLANT
GLOVE BIOGEL PI IND STRL 9 (GLOVE) ×1 IMPLANT
GLOVE BIOGEL PI INDICATOR 8 (GLOVE) ×1
GLOVE BIOGEL PI INDICATOR 9 (GLOVE) ×1
GOWN STRL REUS W/ TWL LRG LVL3 (GOWN DISPOSABLE) ×1 IMPLANT
GOWN STRL REUS W/ TWL XL LVL3 (GOWN DISPOSABLE) ×2 IMPLANT
GOWN STRL REUS W/TWL LRG LVL3 (GOWN DISPOSABLE) ×1
GOWN STRL REUS W/TWL XL LVL3 (GOWN DISPOSABLE) ×2
HANDPIECE INTERPULSE COAX TIP (DISPOSABLE) ×1
HOOD PEEL AWAY FACE SHEILD DIS (HOOD) ×4 IMPLANT
KIT BASIN OR (CUSTOM PROCEDURE TRAY) ×2 IMPLANT
KIT ROOM TURNOVER OR (KITS) ×2 IMPLANT
MANIFOLD NEPTUNE II (INSTRUMENTS) ×2 IMPLANT
NEEDLE 22X1 1/2 (OR ONLY) (NEEDLE) ×4 IMPLANT
NS IRRIG 1000ML POUR BTL (IV SOLUTION) ×2 IMPLANT
PACK TOTAL JOINT (CUSTOM PROCEDURE TRAY) ×2 IMPLANT
PAD ARMBOARD 7.5X6 YLW CONV (MISCELLANEOUS) ×4 IMPLANT
SET HNDPC FAN SPRY TIP SCT (DISPOSABLE) ×1 IMPLANT
SUT VIC AB 0 CT1 27 (SUTURE) ×1
SUT VIC AB 0 CT1 27XBRD ANBCTR (SUTURE) ×1 IMPLANT
SUT VIC AB 1 CTX 36 (SUTURE) ×1
SUT VIC AB 1 CTX36XBRD ANBCTR (SUTURE) ×1 IMPLANT
SUT VIC AB 2-0 CT1 27 (SUTURE) ×1
SUT VIC AB 2-0 CT1 TAPERPNT 27 (SUTURE) ×1 IMPLANT
SUT VIC AB 3-0 CT1 27 (SUTURE) ×1
SUT VIC AB 3-0 CT1 TAPERPNT 27 (SUTURE) ×1 IMPLANT
SYR CONTROL 10ML LL (SYRINGE) ×4 IMPLANT
TOWEL OR 17X24 6PK STRL BLUE (TOWEL DISPOSABLE) ×2 IMPLANT
TOWEL OR 17X26 10 PK STRL BLUE (TOWEL DISPOSABLE) ×2 IMPLANT
TRAY CATH 16FR W/PLASTIC CATH (SET/KITS/TRAYS/PACK) IMPLANT

## 2017-04-22 NOTE — Discharge Instructions (Signed)

## 2017-04-22 NOTE — Anesthesia Postprocedure Evaluation (Signed)
Anesthesia Post Note  Patient: [name]  Procedure(s) Performed: TOTAL KNEE ARTHROPLASTY (Right )     Patient location during evaluation: PACU Anesthesia Type: Spinal Level of consciousness: awake and alert Pain management: pain level controlled Vital Signs Assessment: post-procedure vital signs reviewed and stable Respiratory status: spontaneous breathing and respiratory function stable Cardiovascular status: blood pressure returned to baseline and stable Postop Assessment: spinal receding and no apparent nausea or vomiting Anesthetic complications: no    Last Vitals:  Vitals:   04/22/17 1400 04/22/17 1403  BP:  (!) 104/46  Pulse: 64 66  Resp: 10 20  Temp:    SpO2: 97% 97%    Last Pain:  Vitals:   04/22/17 1115  TempSrc:   PainSc: 0-No pain                 Audry Pili

## 2017-04-22 NOTE — Anesthesia Procedure Notes (Signed)
Spinal  Patient location during procedure: OR Start time: 04/22/2017 11:50 AM End time: 04/22/2017 11:54 AM Staffing Anesthesiologist: Audry Pili, MD Performed: anesthesiologist  Preanesthetic Checklist Completed: patient identified, surgical consent, pre-op evaluation, timeout performed, IV checked, risks and benefits discussed and monitors and equipment checked Spinal Block Patient position: sitting Prep: DuraPrep Patient monitoring: heart rate, cardiac monitor, continuous pulse ox and blood pressure Approach: midline Location: L3-4 Injection technique: single-shot Needle Needle type: Pencan  Needle gauge: 24 G Additional Notes Functioning IV was confirmed and monitors were applied. Sterile prep and drape, including hand hygiene, mask, and sterile gloves were used. The patient was positioned and the spine was prepped. The skin was anesthetized with lidocaine. Free flow of clear CSF was obtained prior to injecting local anesthetic into the CSF. The spinal needle aspirated freely following injection. The needle was carefully withdrawn. The patient tolerated the procedure well. Consent was obtained prior to the procedure with all questions answered and concerns addressed. Risks including, but not limited to, bleeding, infection, nerve damage, paralysis, failed block, inadequate analgesia, allergic reaction, high spinal, itching, and headache were discussed and the patient wished to proceed.  Renold Don, MD

## 2017-04-22 NOTE — Anesthesia Preprocedure Evaluation (Addendum)
Anesthesia Evaluation  Patient identified by MRN, date of birth, ID band Patient awake    Reviewed: Allergy & Precautions, NPO status , Patient's Chart, lab work & pertinent test results  Airway Mallampati: II  TM Distance: >3 FB Neck ROM: Full    Dental  (+) Dental Advisory Given, Teeth Intact   Pulmonary neg pulmonary ROS,    Pulmonary exam normal breath sounds clear to auscultation       Cardiovascular negative cardio ROS Normal cardiovascular exam Rhythm:Regular Rate:Normal     Neuro/Psych Anxiety Depression negative neurological ROS     GI/Hepatic negative GI ROS, Neg liver ROS,   Endo/Other  Hypothyroidism   Renal/GU negative Renal ROS  negative genitourinary   Musculoskeletal  (+) Arthritis ,   Abdominal   Peds  Hematology negative hematology ROS (+)   Anesthesia Other Findings Breast ca s/p chemorad  Reproductive/Obstetrics                            Anesthesia Physical Anesthesia Plan  ASA: II  Anesthesia Plan: Spinal   Post-op Pain Management:  Regional for Post-op pain   Induction:   PONV Risk Score and Plan: Treatment may vary due to age or medical condition and Propofol infusion  Airway Management Planned: Natural Airway and Nasal Cannula  Additional Equipment: None  Intra-op Plan:   Post-operative Plan:   Informed Consent: I have reviewed the patients History and Physical, chart, labs and discussed the procedure including the risks, benefits and alternatives for the proposed anesthesia with the patient or authorized representative who has indicated his/her understanding and acceptance.   Dental advisory given  Plan Discussed with: CRNA  Anesthesia Plan Comments:         Anesthesia Quick Evaluation

## 2017-04-22 NOTE — Progress Notes (Signed)
Orthopedic Tech Progress Note Patient Details:  [name] [birth year] [id number]  Ortho Devices Type of Ortho Device: Bone foam zero knee Ortho Device/Splint Location: Right knee/leg Ortho Device/Splint Interventions: Application   Post Interventions Patient Tolerated: Well Instructions Provided: Adjustment of device, Care of device   Kristopher Oppenheim 04/22/2017, 2:06 PM

## 2017-04-22 NOTE — Op Note (Signed)
PATIENT ID:      [name]  MRN:     [id number] DOB/AGE:    1950/03/30 / 67 y.o.       OPERATIVE REPORT    DATE OF PROCEDURE:  04/22/2017       PREOPERATIVE DIAGNOSIS:   Right osteoarthritis knee      Estimated body mass index is 22.13 kg/m as calculated from the following:   Height as of this encounter: 5\' 5"  (1.651 m).   Weight as of this encounter: 133 lb (60.3 kg).                                                        POSTOPERATIVE DIAGNOSIS:   Right osteoarthritis knee                                                                      PROCEDURE:  Procedure(s): TOTAL KNEE ARTHROPLASTY Using DepuyAttune RP implants #6R Femur, #6Tibia, 6 mm Attune RP bearing, 41 Patella     SURGEON: Kerin Salen    ASSISTANT:   Kerry Hough. [employer]   (Present and scrubbed throughout the case, critical for assistance with exposure, retraction, instrumentation, and closure.)         ANESTHESIA: Spinal, 20cc Exparel, 50cc 0.25% Marcaine  EBL: 300cc  FLUID REPLACEMENT: 1500cc crystalloid  TOURNIQUET TIME: 34min  Drains: None  Tranexamic Acid: 1gm IV, 2gm topical  COMPLICATIONS:  None         INDICATIONS FOR PROCEDURE: The patient has  Right osteoarthritis knee, Var deformities, XR shows bone on bone arthritis, lateral subluxation of tibia. Patient has failed all conservative measures including anti-inflammatory medicines, narcotics, attempts at  exercise and weight loss, cortisone injections and viscosupplementation.  Risks and benefits of surgery have been discussed, questions answered.   DESCRIPTION OF PROCEDURE: The patient identified by armband, received  IV antibiotics, in the holding area at [facility] Patient taken to the operating room, appropriate anesthetic  monitors were attached, and Spinal anesthesia was  induced. Tourniquet  applied high to the operative thigh. Lateral post and foot positioner  applied to the table, the lower extremity was then prepped and draped  in  usual sterile fashion from the toes to the tourniquet. Time-out procedure was performed. We began the operation, with the knee flexed 120 degrees, by making the anterior midline incision starting at handbreadth above the patella going over the patella 1 cm medial to and 4 cm distal to the tibial tubercle. Small bleeders in the skin and the  subcutaneous tissue identified and cauterized. Transverse retinaculum was incised and reflected medially and a medial parapatellar arthrotomy was accomplished. the patella was everted and theprepatellar fat pad resected. The superficial medial collateral  ligament was then elevated from anterior to posterior along the proximal  flare of the tibia and anterior half of the menisci resected. The knee was hyperflexed exposing bone on bone arthritis. Peripheral and notch osteophytes as well as the cruciate ligaments were then resected. We continued to  work our way around posteriorly along the proximal  tibia, and externally  rotated the tibia subluxing it out from underneath the femur. A McHale  retractor was placed through the notch and a lateral Hohmann retractor  placed, and we then drilled through the proximal tibia in line with the  axis of the tibia followed by an intramedullary guide rod and 2-degree  posterior slope cutting guide. The tibial cutting guide, 3 degree posterior sloped, was pinned into place allowing resection of 2 mm of bone medially and 9 mm of bone laterally. Satisfied with the tibial resection, we then  entered the distal femur 2 mm anterior to the PCL origin with the  intramedullary guide rod and applied the distal femoral cutting guide  set at 9 mm, with 5 degrees of valgus. This was pinned along the  epicondylar axis. At this point, the distal femoral cut was accomplished without difficulty. We then sized for a #6R femoral component and pinned the guide in 3 degrees of external rotation. The chamfer cutting guide was pinned into place. The  anterior, posterior, and chamfer cuts were accomplished without difficulty followed by  the Attune RP box cutting guide and the box cut. We also removed posterior osteophytes from the posterior femoral condyles. At this  time, the knee was brought into full extension. We checked our  extension and flexion gaps and found them symmetric for a 6 mm bearing. Distracting in extension with a lamina spreader, the posterior horns of the menisci were removed, and Exparel, diluted to 60 cc, with 20cc NS, and 20cc 0.5% Marcaine,was injected into the capsule and synovium of the knee. The posterior patella cut was accomplished with the 9.5 mm Attune cutting guide, sized for a 66mm dome, and the fixation pegs drilled.The knee  was then once again hyperflexed exposing the proximal tibia. We sized for a # 6 tibial base plate, applied the smokestack and the conical reamer followed by the the Delta fin keel punch. We then hammered into place the Attune RP trial femoral component, drilled the lugs, inserted a  6 mm trial bearing, trial patellar button, and took the knee through range of motion from 0-130 degrees. No thumb pressure was required for patellar Tracking. At this point, the limb was wrapped with an Esmarch bandage and the tourniquet inflated to 350 mmHg. All trial components were removed, mating surfaces irrigated with pulse lavage, and dried with suction and sponges. 10 cc of the Exparel solution was applied to the cancellus bone of the patella distal femur and proximal tibia.  After waiting 1 minute, the bony surfaces were again, dried with sponges. A double batch of DePuy HV cement with 1500 mg of Zinacef was mixed and applied to all bony metallic mating surfaces except for the posterior condyles of the femur itself. In order, we hammered into place the tibial tray and removed excess cement, the femoral component and removed excess cement. The final Attune RP bearing  was inserted, and the knee brought to full  extension with compression.  The patellar button was clamped into place, and excess cement  removed. While the cement cured the wound was irrigated out with normal saline solution pulse lavage. Ligament stability and patellar tracking were checked and found to be excellent. The parapatellar arthrotomy was closed with  running #1 Vicryl suture. The subcutaneous tissue with 0 and 2-0 undyed  Vicryl suture, and the skin with running 3-0 SQ vicryl. A dressing of Xeroform,  4 x 4, dressing sponges, Webril, and Ace wrap applied. The patient  awakened, and  taken to recovery room without difficulty.   Kerin Salen 04/22/2017, 1:34 PM

## 2017-04-22 NOTE — Anesthesia Procedure Notes (Signed)
Procedure Name: MAC Date/Time: 04/22/2017 12:36 PM Performed by: Teressa Lower., CRNA Pre-anesthesia Checklist: Patient identified, Emergency Drugs available, Suction available, Patient being monitored and Timeout performed Patient Re-evaluated:Patient Re-evaluated prior to induction Oxygen Delivery Method: Simple face mask

## 2017-04-22 NOTE — Transfer of Care (Signed)
Immediate Anesthesia Transfer of Care Note  Patient: [name]  Procedure(s) Performed: TOTAL KNEE ARTHROPLASTY (Right )  Patient Location: PACU  Anesthesia Type:Spinal and GA combined with regional for post-op pain  Level of Consciousness: awake, alert  and oriented  Airway & Oxygen Therapy: Patient Spontanous Breathing  Post-op Assessment: Report given to RN and Post -op Vital signs reviewed and stable  Post vital signs: Reviewed and stable  Last Vitals:  Vitals:   04/22/17 1110 04/22/17 1115  BP: (!) 115/46 (!) 109/51  Pulse: 71 63  Resp: 17 14  Temp:    SpO2: 100% 100%    Last Pain:  Vitals:   04/22/17 1115  TempSrc:   PainSc: 0-No pain      Patients Stated Pain Goal: 3 (00/37/04 8889)  Complications: No apparent anesthesia complications

## 2017-04-22 NOTE — Anesthesia Procedure Notes (Signed)
Anesthesia Regional Block: Adductor canal block   Pre-Anesthetic Checklist: ,, timeout performed, Correct Patient, Correct Site, Correct Laterality, Correct Procedure, Correct Position, site marked, Risks and benefits discussed,  Surgical consent,  Pre-op evaluation,  At surgeon's request and post-op pain management  Laterality: Right  Prep: chloraprep       Needles:  Injection technique: Single-shot  Needle Type: Echogenic Needle     Needle Length: 9cm  Needle Gauge: 21     Additional Needles:   Narrative:  Start time: 04/22/2017 11:04 AM End time: 04/22/2017 11:07 AM Injection made incrementally with aspirations every 5 mL.  Performed by: Personally  Anesthesiologist: Audry Pili, MD  Additional Notes: No pain on injection. No increased resistance to injection. Injection made in 5cc increments. Good needle visualization. Patient tolerated the procedure well.

## 2017-04-22 NOTE — Interval H&P Note (Signed)
History and Physical Interval Note:  04/22/2017 11:09 AM  [name]  has presented today for surgery, with the diagnosis of Right osteoarthritis knee  The various methods of treatment have been discussed with the patient and family. After consideration of risks, benefits and other options for treatment, the patient has consented to  Procedure(s): TOTAL KNEE ARTHROPLASTY (Right) as a surgical intervention .  The patient's history has been reviewed, patient examined, no change in status, stable for surgery.  I have reviewed the patient's chart and labs.  Questions were answered to the patient's satisfaction.     Kerin Salen

## 2017-04-23 ENCOUNTER — Encounter (HOSPITAL_COMMUNITY): Payer: Self-pay | Admitting: Orthopedic Surgery

## 2017-04-23 ENCOUNTER — Other Ambulatory Visit: Payer: Self-pay

## 2017-04-23 LAB — BASIC METABOLIC PANEL
ANION GAP: 11 (ref 5–15)
BUN: 11 mg/dL (ref 6–20)
CALCIUM: 8.6 mg/dL — AB (ref 8.9–10.3)
CO2: 24 mmol/L (ref 22–32)
CREATININE: 0.79 mg/dL (ref 0.44–1.00)
Chloride: 105 mmol/L (ref 101–111)
Glucose, Bld: 109 mg/dL — ABNORMAL HIGH (ref 65–99)
Potassium: 3.9 mmol/L (ref 3.5–5.1)
SODIUM: 140 mmol/L (ref 135–145)

## 2017-04-23 LAB — CBC
HEMATOCRIT: 35 % — AB (ref 36.0–46.0)
Hemoglobin: 11.2 g/dL — ABNORMAL LOW (ref 12.0–15.0)
MCH: 31.2 pg (ref 26.0–34.0)
MCHC: 32 g/dL (ref 30.0–36.0)
MCV: 97.5 fL (ref 78.0–100.0)
PLATELETS: 222 10*3/uL (ref 150–400)
RBC: 3.59 MIL/uL — ABNORMAL LOW (ref 3.87–5.11)
RDW: 12.9 % (ref 11.5–15.5)
WBC: 6.9 10*3/uL (ref 4.0–10.5)

## 2017-04-23 MED ORDER — TRAMADOL HCL 50 MG PO TABS
100.0000 mg | ORAL_TABLET | Freq: Four times a day (QID) | ORAL | Status: DC | PRN
  Administered 2017-04-23 – 2017-04-24 (×2): 100 mg via ORAL
  Filled 2017-04-23 (×2): qty 2

## 2017-04-23 MED ORDER — HYDROMORPHONE HCL 1 MG/ML IJ SOLN
1.0000 mg | INTRAMUSCULAR | Status: DC | PRN
  Administered 2017-04-23: 1 mg via INTRAVENOUS
  Administered 2017-04-23: 0.5 mg via INTRAVENOUS
  Administered 2017-04-24 (×2): 1 mg via INTRAVENOUS
  Filled 2017-04-23 (×3): qty 1

## 2017-04-23 NOTE — Evaluation (Signed)
Physical Therapy Evaluation Patient Details Name: [name] MRN: [id number] DOB: 1951-02-26 Today's Date: 04/23/2017   History of Present Illness  Patient is a 67 y/o female admitted for R TKA.  PMH positive for breast CA, DDD cervical spine, and L spine SS.   Clinical Impression  Patient presents with decreased independence with mobility due to deficits listed in PT problem list.  She will benefit from skilled PT in the acute setting to allow return home with family support.  Currently limited due to low BP (68/40 sitting EOB after ambulation) and pt symptomatic with one seated rest and two standing rests during gait.  Patient will benefit from follow up PT at d/c.     Follow Up Recommendations Follow surgeon's recommendation for DC plan and follow-up therapies    Equipment Recommendations  Other (comment)(equipment already delivered to room)    Recommendations for Other Services       Precautions / Restrictions Precautions Precautions: Knee Precaution Comments: verball reviewed Restrictions Weight Bearing Restrictions: Yes RLE Weight Bearing: Weight bearing as tolerated      Mobility  Bed Mobility Overal bed mobility: Needs Assistance Bed Mobility: Supine to Sit     Supine to sit: Min assist;HOB elevated     General bed mobility comments: for R LE to sit and to supine  Transfers Overall transfer level: Needs assistance Equipment used: Rolling walker (2 wheeled) Transfers: Sit to/from Stand Sit to Stand: Min guard         General transfer comment: for safety  Ambulation/Gait Ambulation/Gait assistance: Min guard Ambulation Distance (Feet): 150 Feet Assistive device: Rolling walker (2 wheeled) Gait Pattern/deviations: Step-to pattern;Decreased stride length;Antalgic     General Gait Details: cues for safety, posture and stride length. sat to rest x 1 and couple of stops for standing rest due to dizziness (BP after ambulation 68/40 HR 62)  Stairs            Wheelchair Mobility    Modified Rankin (Stroke Patients Only)       Balance Overall balance assessment: Needs assistance   Sitting balance-Leahy Scale: Good     Standing balance support: Bilateral upper extremity supported Standing balance-Leahy Scale: Poor Standing balance comment: UE support needed                             Pertinent Vitals/Pain Pain Assessment: 0-10 Pain Score: 5  Pain Location: R knee Pain Descriptors / Indicators: Aching;Operative site guarding Pain Intervention(s): Monitored during session;Premedicated before session;Repositioned    Home Living Family/patient expects to be discharged to:: Private residence Living Arrangements: Spouse/significant other Available Help at Discharge: Family Type of Home: House Home Access: Level entry(from basement)     Home Layout: Multi-level(split level) Home Equipment: Bedside commode;Walker - 2 wheels(equipment delivered)      Prior Function Level of Independence: Independent         Comments: reitred, but independent with IADL     Hand Dominance   Dominant Hand: Right    Extremity/Trunk Assessment   Upper Extremity Assessment Upper Extremity Assessment: Overall WFL for tasks assessed    Lower Extremity Assessment Lower Extremity Assessment: RLE deficits/detail RLE Deficits / Details: AROM knee flexion 70, extension -10, strength at least 3/5 throughout       Communication   Communication: No difficulties  Cognition Arousal/Alertness: Awake/alert Behavior During Therapy: WFL for tasks assessed/performed Overall Cognitive Status: Within Functional Limits for tasks assessed  General Comments General comments (skin integrity, edema, etc.): spouse in room, pt shaking at times with exercises and tearful; educated about medication side effects and anesthesia    Exercises Total Joint Exercises Ankle Circles/Pumps:  AROM;10 reps;Both;Supine Quad Sets: AROM;10 reps;Right;Supine Short Arc Quad: AROM;10 reps;Right;Supine Heel Slides: AAROM;Right;Supine;10 reps Hip ABduction/ADduction: AROM;10 reps;Right;Supine Straight Leg Raises: AROM;10 reps;Right;Supine Goniometric ROM: -10 to 70   Assessment/Plan    PT Assessment Patient needs continued PT services  PT Problem List Decreased strength;Decreased mobility;Decreased range of motion;Decreased knowledge of precautions;Decreased activity tolerance;Decreased balance;Decreased knowledge of use of DME;Pain       PT Treatment Interventions DME instruction;Functional mobility training;Balance training;Patient/family education;Gait training;Therapeutic activities;Stair training;Therapeutic exercise    PT Goals (Current goals can be found in the Care Plan section)  Acute Rehab PT Goals Patient Stated Goal: To go home PT Goal Formulation: With patient/family Time For Goal Achievement: 04/25/17 Potential to Achieve Goals: Good    Frequency 7X/week   Barriers to discharge        Co-evaluation               AM-PAC PT "6 Clicks" Daily Activity  Outcome Measure Difficulty turning over in bed (including adjusting bedclothes, sheets and blankets)?: A Little Difficulty moving from lying on back to sitting on the side of the bed? : A Little Difficulty sitting down on and standing up from a chair with arms (e.g., wheelchair, bedside commode, etc,.)?: Unable Help needed moving to and from a bed to chair (including a wheelchair)?: A Little Help needed walking in hospital room?: A Little Help needed climbing 3-5 steps with a railing? : A Lot 6 Click Score: 15    End of Session Equipment Utilized During Treatment: Gait belt Activity Tolerance: Treatment limited secondary to medical complications (Comment)(low BP) Patient left: in bed;with call bell/phone within reach   PT Visit Diagnosis: Difficulty in walking, not elsewhere classified  (R26.2);Pain;Muscle weakness (generalized) (M62.81) Pain - Right/Left: Right Pain - part of body: Knee    Time: 6599-3570 PT Time Calculation (min) (ACUTE ONLY): 41 min   Charges:   PT Evaluation $PT Eval Low Complexity: 1 Low PT Treatments $Gait Training: 8-22 mins $Therapeutic Exercise: 8-22 mins   PT G CodesMagda Kiel, [place] [phone] 04/23/2017   [name] 04/23/2017, 1:02 PM

## 2017-04-23 NOTE — Progress Notes (Signed)
PATIENT ID: [name]  MRN: [id number]  DOB/AGE:  06-19-1950 / 67 y.o.  1 Day Post-Op Procedure(s) (LRB): TOTAL KNEE ARTHROPLASTY (Right)    PROGRESS NOTE Subjective: Patient is alert, oriented, no Nausea, no Vomiting, yes passing gas. Taking PO well. Denies SOB, Chest or Calf Pain. Using Incentive Spirometer, PAS in place. Ambulate walked in room, Patient reports pain as 2/10, can SLR .    Objective: Vital signs in last 24 hours: Vitals:   04/22/17 2100 04/23/17 0140 04/23/17 0200 04/23/17 0539  BP: (!) 136/51 (!) 81/25 (!) 110/50 (!) 112/47  Pulse: 63 66  63  Resp:  17    Temp: 98.1 F (36.7 C) 98.4 F (36.9 C)  97.7 F (36.5 C)  TempSrc: Oral Oral  Oral  SpO2: 94% 94%  100%  Weight:      Height:          Intake/Output from previous day: I/O last 3 completed shifts: In: 1252 [P.O.:222; I.V.:920; IV Piggyback:110] Out: 200 [Urine:100; Blood:100]   Intake/Output this shift: No intake/output data recorded.   LABORATORY DATA: Recent Labs    04/23/17 0559  NA 140  K 3.9  CL 105  CO2 24  BUN 11  CREATININE 0.79  GLUCOSE 109*  CALCIUM 8.6*    Examination: Neurologically intact ABD soft Neurovascular intact Sensation intact distally Intact pulses distally Dorsiflexion/Plantar flexion intact Incision: dressing C/D/I and no drainage No cellulitis present Compartment soft}  Assessment:   1 Day Post-Op Procedure(s) (LRB): TOTAL KNEE ARTHROPLASTY (Right) ADDITIONAL DIAGNOSIS: Expected Acute Blood Loss Anemia, hx spinal stenosis  Plan: PT/OT WBAT, AROM and PROM  DVT Prophylaxis:  SCDx72hrs, ASA 325 mg BID x 2 weeks DISCHARGE PLAN: Home, if passes PT DISCHARGE NEEDS: HHPT, Walker and 3-in-1 comode seat     Kerin Salen 04/23/2017, 6:52 AM

## 2017-04-23 NOTE — Progress Notes (Signed)
Physical Therapy Treatment Patient Details Name: [name] MRN: [id number] DOB: June 24, 1950 Today's Date: 04/23/2017    History of Present Illness Patient is a 67 y/o female admitted for R TKA.  PMH positive for breast CA, DDD cervical spine, and L spine SS.     PT Comments    Patient progressing some this session able to negotiate stairs this pm, but still very symptomatic with BP drop even after seated for ride back to room prior to taking BP.  Feel she would benefit from continued skilled PT in the acute setting prior to d/c home with spouse assist.  Mainly due to symptomatic low BP and pain.  Will follow up in AM.  BP lying 132/58 Sitting 121/55 After ambulation, sitting in chair 105/38    Follow Up Recommendations  Follow surgeon's recommendation for DC plan and follow-up therapies     Equipment Recommendations  Other (comment)(already has RW and 3:1 in room)    Recommendations for Other Services       Precautions / Restrictions Precautions Precautions: Knee Precaution Comments: verball reviewed Restrictions Weight Bearing Restrictions: Yes RLE Weight Bearing: Weight bearing as tolerated    Mobility  Bed Mobility Overal bed mobility: Needs Assistance Bed Mobility: Supine to Sit     Supine to sit: Min assist;HOB elevated     General bed mobility comments: for R LE to sit and to supine  Transfers Overall transfer level: Needs assistance Equipment used: Rolling walker (2 wheeled) Transfers: Sit to/from Stand Sit to Stand: Supervision         General transfer comment: for safety; also simulated car transfer into small SUV with min A for L LE  Ambulation/Gait Ambulation/Gait assistance: Supervision Ambulation Distance (Feet): 160 Feet Assistive device: Rolling walker (2 wheeled) Gait Pattern/deviations: Step-to pattern;Shuffle;Decreased stride length;Antalgic;Trunk flexed     General Gait Details: cues for increased foot clearance on L and for  posture   Stairs Stairs: Yes   Stair Management: One rail Left;With cane;Step to pattern;Forwards Number of Stairs: 2 General stair comments: demonstrated and pt performed with assist for balance/safety  Wheelchair Mobility    Modified Rankin (Stroke Patients Only)       Balance Overall balance assessment: Needs assistance   Sitting balance-Leahy Scale: Good     Standing balance support: Bilateral upper extremity supported Standing balance-Leahy Scale: Poor Standing balance comment: initially better than later in session due to dizziness/symptomatic orthostasis                            Cognition Arousal/Alertness: Awake/alert Behavior During Therapy: WFL for tasks assessed/performed Overall Cognitive Status: Within Functional Limits for tasks assessed                                        Exercises Total Joint Exercises Ankle Circles/Pumps: AROM;10 reps;Both;Supine Quad Sets: AROM;10 reps;Right;Supine Short Arc Quad: AROM;10 reps;Right;Supine Heel Slides: AAROM;Right;Supine;10 reps Hip ABduction/ADduction: AROM;10 reps;Right;Supine Straight Leg Raises: AROM;10 reps;Right;Supine Goniometric ROM: -10 to 70    General Comments General comments (skin integrity, edema, etc.): RN made aware pt likely not medically ready due to uncontrolled pain and low BP      Pertinent Vitals/Pain Pain Assessment: Faces Faces Pain Scale: Hurts even more Pain Location: R knee Pain Descriptors / Indicators: Aching;Operative site guarding Pain Intervention(s): Monitored during session;Repositioned    Home Living  Prior Function            PT Goals (current goals can now be found in the care plan section) Acute Rehab PT Goals Patient Stated Goal: To go home PT Goal Formulation: With patient/family Time For Goal Achievement: 04/25/17 Potential to Achieve Goals: Good Progress towards PT goals: Progressing toward  goals    Frequency    7X/week      PT Plan Current plan remains appropriate    Co-evaluation              AM-PAC PT "6 Clicks" Daily Activity  Outcome Measure  Difficulty turning over in bed (including adjusting bedclothes, sheets and blankets)?: A Little Difficulty moving from lying on back to sitting on the side of the bed? : A Little Difficulty sitting down on and standing up from a chair with arms (e.g., wheelchair, bedside commode, etc,.)?: A Little Help needed moving to and from a bed to chair (including a wheelchair)?: A Little Help needed walking in hospital room?: A Little Help needed climbing 3-5 steps with a railing? : A Little 6 Click Score: 18    End of Session Equipment Utilized During Treatment: Gait belt Activity Tolerance: Patient limited by fatigue Patient left: in chair;with call bell/phone within reach Nurse Communication: Mobility status PT Visit Diagnosis: Difficulty in walking, not elsewhere classified (R26.2);Pain;Muscle weakness (generalized) (M62.81) Pain - Right/Left: Right Pain - part of body: Knee     Time: 1459-1540 PT Time Calculation (min) (ACUTE ONLY): 41 min  Charges:  $Gait Training: 23-37 mins $Therapeutic Exercise: 8-22 mins $Therapeutic Activity: 8-22 mins                    G CodesMagda Luna, [place] [phone] 04/23/2017    [name] 04/23/2017, 4:06 PM

## 2017-04-24 LAB — CBC
HEMATOCRIT: 28.7 % — AB (ref 36.0–46.0)
Hemoglobin: 9.6 g/dL — ABNORMAL LOW (ref 12.0–15.0)
MCH: 31.6 pg (ref 26.0–34.0)
MCHC: 33.4 g/dL (ref 30.0–36.0)
MCV: 94.4 fL (ref 78.0–100.0)
Platelets: 191 10*3/uL (ref 150–400)
RBC: 3.04 MIL/uL — ABNORMAL LOW (ref 3.87–5.11)
RDW: 12.6 % (ref 11.5–15.5)
WBC: 7.7 10*3/uL (ref 4.0–10.5)

## 2017-04-24 NOTE — Plan of Care (Signed)
  Education: Knowledge of General Education information will improve 04/24/2017 0518 - Progressing by Anson Fret, RN Note POC reviewed with pt. and pain management.

## 2017-04-24 NOTE — Care Management Note (Signed)
Case Management Note  Patient Details  Name: [name] MRN: [id number] Date of Birth: May 15, 1950  Subjective/Objective:  67 yr old female s/p right total knee arthroplasty.                  Action/Plan: Case manager spoke with patient concerning discharge plan and DME. Choice for [facility], referral was called to Neoma Laming, [facility] Liaison. Patient will have family support at discharge.    Expected Discharge Date:  04/24/17               Expected Discharge Plan:  [facility]  In-House Referral:  NA  Discharge planning Services  CM Consult  Post Acute Care Choice:  Home Health, Durable Medical Equipment Choice offered to:  Patient  DME Arranged:  Walker rolling(has 3in1) DME Agency:  [employer]  HH Arranged:  PT [place]:  [facility]  Status of Service:  Completed, signed off  If discussed at [facility] of Stay Meetings, dates discussed:    Additional Comments:  Ninfa Meeker, RN 04/24/2017, 2:15 PM

## 2017-04-24 NOTE — Progress Notes (Signed)
Physical Therapy Treatment Patient Details Name: [name] MRN: [id number] DOB: [birth year] Today's Date: 04/24/2017    History of Present Illness Patient is a 67 y/o female admitted for R TKA.  PMH positive for breast CA, DDD cervical spine, and L spine SS.     PT Comments    This PM session focused on gait training, stairs, and completing HEP. Pt required min A for stairs with RW and rail. Pt expected to d/c this PM. She would benefit from continued skilled PT to increase functional independence and safety with mobility.    Follow Up Recommendations  Follow surgeon's recommendation for DC plan and follow-up therapies     Equipment Recommendations  Other (comment)(already has RW and 3:1 in room)    Recommendations for Other Services       Precautions / Restrictions Precautions Precautions: Knee Precaution Comments: reviewed seated HEP Restrictions Weight Bearing Restrictions: Yes RLE Weight Bearing: Weight bearing as tolerated    Mobility  Bed Mobility Overal bed mobility: Modified Independent             General bed mobility comments: in chair on arrival  Transfers Overall transfer level: Needs assistance Equipment used: Rolling walker (2 wheeled) Transfers: Sit to/from Stand Sit to Stand: Supervision         General transfer comment: cues for safe hand placement  Ambulation/Gait Ambulation/Gait assistance: Supervision Ambulation Distance (Feet): 150 Feet Assistive device: Rolling walker (2 wheeled) Gait Pattern/deviations: Step-to pattern;Antalgic;Step-through pattern;Decreased stance time - right;Decreased step length - left Gait velocity: decreased Gait velocity interpretation: Below normal speed for age/gender General Gait Details: pt with mildly antalgic gait pattern. Step to pattern secondary to post op pain.   Stairs Stairs: Yes   Stair Management: One rail Left;Step to pattern;One rail Right;Forwards;With walker;With cane Number of Stairs:  12 General stair comments: pt trialed steps with RW on R and hand rail on L, second trial with cane on R and rail on L. Pt prefered using RW for stability while negotiating steps.  Min a for [employer].  Wheelchair Mobility    Modified Rankin (Stroke Patients Only)       Balance Overall balance assessment: Needs assistance   Sitting balance-Leahy Scale: Good     Standing balance support: Bilateral upper extremity supported Standing balance-Leahy Scale: Poor                              Cognition Arousal/Alertness: Awake/alert Behavior During Therapy: WFL for tasks assessed/performed Overall Cognitive Status: Within Functional Limits for tasks assessed                                        Exercises Total Joint Exercises Heel Slides: AROM;Right;10 reps;Seated Long Arc Quad: AAROM;Right;5 reps;Seated Knee Flexion: AROM;Right;5 reps;Seated(10 sec holds)    General Comments        Pertinent Vitals/Pain Pain Assessment: 0-10 Pain Score: 4  Pain Location: R knee Pain Descriptors / Indicators: Grimacing;Guarding;Sore Pain Intervention(s): Monitored during session;Limited activity within patient's tolerance;Patient requesting pain meds-RN notified    Home Living                      Prior Function            PT Goals (current goals can now be found in the care plan section) Acute Rehab  PT Goals Patient Stated Goal: To go home PT Goal Formulation: With patient/family Time For Goal Achievement: 04/25/17 Potential to Achieve Goals: Good Progress towards PT goals: Progressing toward goals    Frequency    7X/week      PT Plan Current plan remains appropriate    Co-evaluation              AM-PAC PT "6 Clicks" Daily Activity  Outcome Measure  Difficulty turning over in bed (including adjusting bedclothes, sheets and blankets)?: None Difficulty moving from lying on back to sitting on the side of the bed? :  None Difficulty sitting down on and standing up from a chair with arms (e.g., wheelchair, bedside commode, etc,.)?: A Little Help needed moving to and from a bed to chair (including a wheelchair)?: A Little Help needed walking in hospital room?: A Little Help needed climbing 3-5 steps with a railing? : A Little 6 Click Score: 20    End of Session Equipment Utilized During Treatment: Gait belt Activity Tolerance: Patient tolerated treatment well Patient left: in chair;with call bell/phone within reach Nurse Communication: Mobility status;Patient requests pain meds PT Visit Diagnosis: Difficulty in walking, not elsewhere classified (R26.2);Pain;Muscle weakness (generalized) (M62.81) Pain - Right/Left: Right Pain - part of body: Knee     Time: 1450-1525 PT Time Calculation (min) (ACUTE ONLY): 35 min  Charges:  $Gait Training: 8-22 mins $Therapeutic Exercise: 8-22 mins                    G Codes:       Benjiman Core, [place] Pager [phone] Acute Rehab  Allena Katz 04/24/2017, 3:37 PM

## 2017-04-24 NOTE — Progress Notes (Signed)
PATIENT ID: [name]  MRN: [id number]  DOB/AGE:  [birth year] / 67 y.o.  2 Days Post-Op Procedure(s) (LRB): TOTAL KNEE ARTHROPLASTY (Right)    PROGRESS NOTE Subjective: Patient is alert, oriented, no Nausea, no Vomiting, yes passing gas. Taking PO well.  Pt did get hypotensive with therapy yesterday. Denies SOB, Chest or Calf Pain. Using Incentive Spirometer, PAS in place. Ambulate WBAT with pt walking 160 ft with therapy, Patient reports pain as 4/10 .    Objective: Vital signs in last 24 hours: Vitals:   04/23/17 0539 04/23/17 1105 04/23/17 2108 04/24/17 0705  BP: (!) 112/47 124/62 (!) 131/53 (!) 132/56  Pulse: 63 68 78 72  Resp:  13 13 15   Temp: 97.7 F (36.5 C) 97.7 F (36.5 C) 98.5 F (36.9 C) 98.1 F (36.7 C)  TempSrc: Oral Oral Oral Oral  SpO2: 100% 99% 99% 99%  Weight:      Height:          Intake/Output from previous day: I/O last 3 completed shifts: In: 1881 [P.O.:676; I.V.:1205] Out: -    Intake/Output this shift: No intake/output data recorded.   LABORATORY DATA: Recent Labs    04/23/17 0559 04/24/17 0521  WBC 6.9 7.7  HGB 11.2* 9.6*  HCT 35.0* 28.7*  PLT 222 191  NA 140  --   K 3.9  --   CL 105  --   CO2 24  --   BUN 11  --   CREATININE 0.79  --   GLUCOSE 109*  --   CALCIUM 8.6*  --     Examination: Neurologically intact Neurovascular intact Sensation intact distally Intact pulses distally Dorsiflexion/Plantar flexion intact Incision: dressing C/D/I and no drainage No cellulitis present Compartment soft}  Assessment:   2 Days Post-Op Procedure(s) (LRB): TOTAL KNEE ARTHROPLASTY (Right) ADDITIONAL DIAGNOSIS: Expected Acute Blood Loss Anemia, hx of spinal stenosis  Plan: PT/OT WBAT, AROM and PROM  DVT Prophylaxis:  SCDx72hrs, ASA 325 mg BID x 2 weeks DISCHARGE PLAN: Home DISCHARGE NEEDS: HHPT, Walker and 3-in-1 comode seat     Joanell Rising 04/24/2017, 8:00 AM

## 2017-04-24 NOTE — Discharge Summary (Signed)
Patient ID: [name] MRN: [id number] DOB/AGE: 69/28/1952 69 y.o.  Admit date: 04/22/2017 Discharge date: 04/24/2017  Admission Diagnoses:  Active Problems:   Primary osteoarthritis of right knee   Discharge Diagnoses:  Same  Past Medical History:  Diagnosis Date  . Breast cancer, right breast ([place]) 11/1997   S/P lumphectomy, chemo,  and radiation therapy  . Broken arm 6/16   right  . Hypercholesteremia   . Hypothyroidism   . Osteoarthritis    "knees; maybe in my lower back" (04/23/2017)  . Personal history of chemotherapy   . Personal history of radiation therapy   . Thyroid disease    hypothyroid    Surgeries: Procedure(s): TOTAL KNEE ARTHROPLASTY on 04/22/2017   Consultants:   Discharged Condition: Improved  Hospital Course: [name] is an 67 y.o. female who was admitted 04/22/2017 for operative treatment of<principal problem not specified>. Patient has severe unremitting pain that affects sleep, daily activities, and work/hobbies. After pre-op clearance the patient was taken to the operating room on 04/22/2017 and underwent  Procedure(s): TOTAL KNEE ARTHROPLASTY.    Patient was given perioperative antibiotics:  Anti-infectives (From admission, onward)   Start     Dose/Rate Route Frequency Ordered Stop   04/22/17 1001  ceFAZolin (ANCEF) 2-4 GM/100ML-% IVPB    Comments:  Bobbie Stack   : cabinet override      04/22/17 1001 04/22/17 2214       Patient was given sequential compression devices, early ambulation, and chemoprophylaxis to prevent DVT.  Patient benefited maximally from hospital stay and there were no complications.    Recent vital signs:  Patient Vitals for the past 24 hrs:  BP Temp Temp src Pulse Resp SpO2  04/24/17 0705 (!) 132/56 98.1 F (36.7 C) Oral 72 15 99 %  04/23/17 2108 (!) 131/53 98.5 F (36.9 C) Oral 78 13 99 %  04/23/17 1105 124/62 97.7 F (36.5 C) Oral 68 13 99 %     Recent laboratory studies:  Recent Labs    04/23/17 0559  04/24/17 0521  WBC 6.9 7.7  HGB 11.2* 9.6*  HCT 35.0* 28.7*  PLT 222 191  NA 140  --   K 3.9  --   CL 105  --   CO2 24  --   BUN 11  --   CREATININE 0.79  --   GLUCOSE 109*  --   CALCIUM 8.6*  --      Discharge Medications:   Allergies as of 04/24/2017      Reactions   Gabapentin Other (See Comments)   Gave pt migraine      Medication List    STOP taking these medications   traMADol 50 MG tablet Commonly known as:  ULTRAM     TAKE these medications   ALPRAZolam 0.5 MG tablet Commonly known as:  XANAX Take 0.25-0.5 mg by mouth 2 (two) times daily as needed for anxiety.   aspirin EC 325 MG tablet Take 1 tablet (325 mg total) by mouth 2 (two) times daily.   Biotin 10000 MCG Tabs Take 1 tablet by mouth daily.   CALCIUM 600 + D PO Take 1 tablet by mouth daily.   CoQ10 400 MG Caps Take 1 capsule by mouth daily.   denosumab 60 MG/ML Soln injection Commonly known as:  PROLIA Inject 60 mg into the skin every 6 (six) months. Administer in upper arm, thigh, or abdomen   Fish Oil 1200 MG Caps Take 1 capsule by mouth daily.  IRON 100/C PO Take 1 tablet by mouth every other day.   levothyroxine 75 MCG tablet Commonly known as:  SYNTHROID, LEVOTHROID Take 75 mcg by mouth daily before breakfast.   Magnesium Malate 1250 (141.7 Mg) MG Tabs Take 2 tablets by mouth at bedtime.   MULTIVITAMIN PO Take 1 tablet by mouth daily.   oxyCODONE-acetaminophen 5-325 MG tablet Commonly known as:  PERCOCET/ROXICET Take 1 tablet by mouth every 4 (four) hours as needed for severe pain.   simvastatin 20 MG tablet Commonly known as:  ZOCOR Take 20 mg by mouth every evening.   temazepam 15 MG capsule Commonly known as:  RESTORIL Take 15 mg by mouth at bedtime as needed for sleep.   tiZANidine 2 MG tablet Commonly known as:  ZANAFLEX Take 1 tablet (2 mg total) by mouth every 6 (six) hours as needed for muscle spasms.   Vitamin D (Ergocalciferol) 50000 units Caps  capsule Commonly known as:  DRISDOL TAKE 1 CAPSULE (50,000 UNITS TOTAL) BY MOUTH EVERY 7 (SEVEN) DAYS.            Durable Medical Equipment  (From admission, onward)        Start     Ordered   04/22/17 1632  DME Walker rolling  Once    Question:  Patient needs a walker to treat with the following condition  Answer:  Status post right knee replacement   04/22/17 1632   04/22/17 1632  DME 3 n 1  Once     04/22/17 1632   04/22/17 1632  DME Bedside commode  Once    Question:  Patient needs a bedside commode to treat with the following condition  Answer:  Status post right knee replacement   04/22/17 1632      Diagnostic Studies: Dg Chest 2 View  Result Date: 04/09/2017 CLINICAL DATA:  Preoperative examination prior to knee joint replacement. No current chest complaints. History of breast malignancy. Nonsmoker. EXAM: CHEST  2 VIEW COMPARISON:  Chest x-ray of September 01, 2003 FINDINGS: The lungs are adequately inflated. There is no focal infiltrate. There is persistent increased density in the right pulmonary apex which is stable. There are post lumpectomy changes on the right. The heart and pulmonary vascularity are normal. The mediastinum is normal in width. There is gentle levocurvature centered in the lower thoracic spine. IMPRESSION: Chronic pleuroparenchymal changes in the right pulmonary apex. No acute cardiopulmonary abnormality. Electronically Signed   By: David  Martinique M.D.   On: 04/09/2017 11:17    Disposition: 01-Home or Self Care  Discharge Instructions    Call MD / Call 911   Complete by:  As directed    If you experience chest pain or shortness of breath, CALL 911 and be transported to the hospital emergency room.  If you develope a fever above 101 F, pus (white drainage) or increased drainage or redness at the wound, or calf pain, call your surgeon's office.   Constipation Prevention   Complete by:  As directed    Drink plenty of fluids.  Prune juice may be helpful.  You  may use a stool softener, such as Colace (over the counter) 100 mg twice a day.  Use MiraLax (over the counter) for constipation as needed.   Diet - low sodium heart healthy   Complete by:  As directed    Driving restrictions   Complete by:  As directed    No driving for 2 weeks   Increase activity slowly as tolerated  Complete by:  As directed    Patient may shower   Complete by:  As directed    You may shower without a dressing once there is no drainage.  Do not wash over the wound.  If drainage remains, cover wound with plastic wrap and then shower.      Follow-up Information    Frederik Pear, MD Follow up in 2 week(s).   Specialty:  Orthopedic Surgery Contact information: [place] [address] [phone]            Signed: Joanell Rising 04/24/2017, 8:02 AM

## 2017-04-24 NOTE — Progress Notes (Signed)
Physical Therapy Treatment Patient Details Name: [name] MRN: [id number] DOB: 07/20/1950 Today's Date: 04/24/2017    History of Present Illness Patient is a 67 y/o female admitted for R TKA.  PMH positive for breast CA, DDD cervical spine, and L spine SS.     PT Comments    Patient is progressing toward mobility goals. Current plan remains appropriate.    Follow Up Recommendations  Follow surgeon's recommendation for DC plan and follow-up therapies     Equipment Recommendations  Other (comment)(already has RW and 3:1 in room)    Recommendations for Other Services       Precautions / Restrictions Precautions Precautions: Knee Precaution Comments: knee precautions reviewed with pt Restrictions Weight Bearing Restrictions: Yes RLE Weight Bearing: Weight bearing as tolerated    Mobility  Bed Mobility Overal bed mobility: Modified Independent Bed Mobility: Supine to Sit           General bed mobility comments: increased time and effort  Transfers Overall transfer level: Needs assistance Equipment used: Rolling walker (2 wheeled) Transfers: Sit to/from Stand Sit to Stand: Supervision         General transfer comment: cues for safe hand placement  Ambulation/Gait Ambulation/Gait assistance: Supervision Ambulation Distance (Feet): 220 Feet Assistive device: Rolling walker (2 wheeled) Gait Pattern/deviations: Step-to pattern;Antalgic;Step-through pattern;Decreased stance time - right;Decreased step length - left     General Gait Details: cues for sequencing of gait with use of AD, R heel strike/toe off, and bilat step length symmetry   Stairs            Wheelchair Mobility    Modified Rankin (Stroke Patients Only)       Balance Overall balance assessment: Needs assistance   Sitting balance-Leahy Scale: Good     Standing balance support: Bilateral upper extremity supported Standing balance-Leahy Scale: Poor                               Cognition Arousal/Alertness: Awake/alert Behavior During Therapy: WFL for tasks assessed/performed Overall Cognitive Status: Within Functional Limits for tasks assessed                                        Exercises Total Joint Exercises Ankle Circles/Pumps: AROM;10 reps;Both Quad Sets: AROM;10 reps;Right Short Arc Quad: AROM;10 reps;Right Heel Slides: AAROM;Right;10 reps Hip ABduction/ADduction: AROM;10 reps;Right Straight Leg Raises: AROM;10 reps;Right    General Comments        Pertinent Vitals/Pain Pain Assessment: Faces Faces Pain Scale: Hurts even more Pain Location: R knee Pain Descriptors / Indicators: Grimacing;Guarding;Sore Pain Intervention(s): Limited activity within patient's tolerance;Monitored during session;Premedicated before session;Repositioned    Home Living                      Prior Function            PT Goals (current goals can now be found in the care plan section) Acute Rehab PT Goals PT Goal Formulation: With patient/family Time For Goal Achievement: 04/25/17 Potential to Achieve Goals: Good Progress towards PT goals: Progressing toward goals    Frequency    7X/week      PT Plan Current plan remains appropriate    Co-evaluation              AM-PAC PT "6 Clicks" Daily Activity  Outcome  Measure  Difficulty turning over in bed (including adjusting bedclothes, sheets and blankets)?: A Little Difficulty moving from lying on back to sitting on the side of the bed? : A Little Difficulty sitting down on and standing up from a chair with arms (e.g., wheelchair, bedside commode, etc,.)?: A Little Help needed moving to and from a bed to chair (including a wheelchair)?: A Little Help needed walking in hospital room?: A Little Help needed climbing 3-5 steps with a railing? : A Little 6 Click Score: 18    End of Session Equipment Utilized During Treatment: Gait belt Activity Tolerance: Patient  tolerated treatment well Patient left: in chair;with call bell/phone within reach Nurse Communication: Mobility status PT Visit Diagnosis: Difficulty in walking, not elsewhere classified (R26.2);Pain;Muscle weakness (generalized) (M62.81) Pain - Right/Left: Right Pain - part of body: Knee     Time: 0626-9485 PT Time Calculation (min) (ACUTE ONLY): 34 min  Charges:  $Gait Training: 8-22 mins $Therapeutic Exercise: 8-22 mins                    G Codes:       Earney Navy, PTA Pager: [phone]     Darliss Cheney 04/24/2017, 11:42 AM

## 2017-04-24 NOTE — Progress Notes (Signed)
Discharge home. Home discharge instruction given, no question verbalized. 

## 2017-04-29 ENCOUNTER — Other Ambulatory Visit: Payer: Self-pay | Admitting: Family Medicine

## 2017-06-19 ENCOUNTER — Ambulatory Visit (INDEPENDENT_AMBULATORY_CARE_PROVIDER_SITE_OTHER): Payer: Medicare Other | Admitting: Certified Nurse Midwife

## 2017-06-19 ENCOUNTER — Other Ambulatory Visit: Payer: Self-pay

## 2017-06-19 ENCOUNTER — Encounter: Payer: Self-pay | Admitting: Certified Nurse Midwife

## 2017-06-19 VITALS — BP 104/68 | HR 68 | Resp 16 | Ht 65.0 in | Wt 134.0 lb

## 2017-06-19 DIAGNOSIS — Z01419 Encounter for gynecological examination (general) (routine) without abnormal findings: Secondary | ICD-10-CM

## 2017-06-19 DIAGNOSIS — Z853 Personal history of malignant neoplasm of breast: Secondary | ICD-10-CM | POA: Diagnosis not present

## 2017-06-19 DIAGNOSIS — N952 Postmenopausal atrophic vaginitis: Secondary | ICD-10-CM

## 2017-06-19 DIAGNOSIS — Z780 Asymptomatic menopausal state: Secondary | ICD-10-CM | POA: Diagnosis not present

## 2017-06-19 NOTE — Patient Instructions (Signed)
Atrophic Vaginitis Atrophic vaginitis is when the tissues that line the vagina become dry and thin. This is caused by a drop in estrogen. Estrogen helps:  To keep the vagina moist.  To make a clear fluid that helps: ? To lubricate the vagina for sex. ? To protect the vagina from infection.  If the lining of the vagina is dry and thin, it may:  Make sex painful. It may also cause bleeding.  Cause a feeling of: ? Burning. ? Irritation. ? Itchiness.  Make an exam of your vagina painful. It may also cause bleeding.  Make you lose interest in sex.  Cause a burning feeling when you pee.  Make your vaginal fluid (discharge) brown or yellow.  For some women, there are no symptoms. This condition is most common in women who do not get their regular menstrual periods anymore (menopause). This often starts when a woman is 45-55 years old. Follow these instructions at home:  Take medicines only as told by your doctor. Do not use any herbal or alternative medicines unless your doctor says it is okay.  Use over-the-counter products for dryness only as told by your doctor. These include: ? Creams. ? Lubricants. ? Moisturizers.  Do not douche.  Do not use products that can make your vagina dry. These include: ? Scented feminine sprays. ? Scented tampons. ? Scented soaps.  If it hurts to have sex, tell your sexual partner. Contact a doctor if:  Your discharge looks different than normal.  Your vagina has an unusual smell.  You have new symptoms.  Your symptoms do not get better with treatment.  Your symptoms get worse. This information is not intended to replace advice given to you by your health care provider. Make sure you discuss any questions you have with your health care provider. Document Released: 08/08/2007 Document Revised: 07/28/2015 Document Reviewed: 02/10/2014 Elsevier Interactive Patient Education  2018 Elsevier Inc.  

## 2017-06-19 NOTE — Progress Notes (Signed)
67 y.o. G85P2002 Married  Caucasian Fe here for annual exam. Menopausal no HRT. Denies vaginal bleeding. Some vaginal dryness using coconut oil. Sees PCP for aex, labs, medication management of cholesterol, hypothyroid and insomnia. All stable per patient. Recent right knee replacement, doing well with recovery. Planning trip once fully recovered and has other knee repair. Saw dermatology regarding alopecia and recommended Finasteride, if not a problem with previous breast cancer. Patient would like our opinion. No other health issues today.  Patient's last menstrual period was 03/05/1997.          Sexually active: No.  The current method of family planning is status post hysterectomy.    Exercising: Yes.    gym & pool Smoker:  no  Health Maintenance: Pap:  8/11 neg History of Abnormal Pap: no MMG:  01-28-17 category b density birads 2:neg Self Breast exams: yes Colonoscopy:  2004  scheduled BMD:   2015 osteoporosis TDaP:  2014 Shingles: had done Pneumonia: had done Hep C and HIV: not done Labs: pcp   reports that she has never smoked. She has never used smokeless tobacco. She reports that she drinks about 3.6 - 4.8 oz of alcohol per week. She reports that she does not use drugs.  Past Medical History:  Diagnosis Date  . Breast cancer, right breast ([place]) 11/1997   S/P lumphectomy, chemo,  and radiation therapy  . Broken arm 6/16   right  . Hypercholesteremia   . Hypothyroidism   . Osteoarthritis    "knees; maybe in my lower back" (04/23/2017)  . Personal history of chemotherapy   . Personal history of radiation therapy   . Thyroid disease    hypothyroid    Past Surgical History:  Procedure Laterality Date  . BLADDER SUSPENSION  02/2002   [facility]  . BREAST BIOPSY Right 1999  . BREAST LUMPECTOMY Right 11/1997  . JOINT REPLACEMENT    . MOUTH SURGERY     "dental implants"  . NASAL SINUS SURGERY  12/2015  . TOTAL KNEE ARTHROPLASTY Right 04/22/2017   Procedure: TOTAL KNEE  ARTHROPLASTY;  Surgeon: Frederik Pear, MD;  Location: [facility];  Service: Orthopedics;  Laterality: Right;  . TUBAL LIGATION    . VAGINAL HYSTERECTOMY  02/2002   [facility]-BSO    Current Outpatient Medications  Medication Sig Dispense Refill  . ALPRAZolam (XANAX) 0.5 MG tablet Take 0.25-0.5 mg by mouth 2 (two) times daily as needed for anxiety.    . Biotin 10000 MCG TABS Take 1 tablet by mouth daily.     . Calcium Carb-Cholecalciferol (CALCIUM 600 + D PO) Take 1 tablet by mouth daily.    . Coenzyme Q10 (COQ10) 400 MG CAPS Take 1 capsule by mouth daily.    Marland Kitchen denosumab (PROLIA) 60 MG/ML SOLN injection Inject 60 mg into the skin every 6 (six) months. Administer in upper arm, thigh, or abdomen    . levothyroxine (SYNTHROID, LEVOTHROID) 75 MCG tablet Take 75 mcg by mouth daily before breakfast.     . Magnesium Malate 1250 (141.7 Mg) MG TABS Take 1 tablet by mouth at bedtime.     . Multiple Vitamins-Minerals (MULTIVITAMIN PO) Take 1 tablet by mouth daily.     . Omega-3 Fatty Acids (FISH OIL) 1200 MG CAPS Take 1 capsule by mouth daily.     Marland Kitchen oxyCODONE-acetaminophen (PERCOCET/ROXICET) 5-325 MG tablet Take 1 tablet by mouth every 4 (four) hours as needed for severe pain. 30 tablet 0  . simvastatin (ZOCOR) 20 MG tablet Take 20  mg by mouth every evening.  2  . temazepam (RESTORIL) 15 MG capsule Take 15 mg by mouth at bedtime as needed for sleep.   0   No current facility-administered medications for this visit.     Family History  Problem Relation Age of Onset  . Rheum arthritis Mother   . Non-Hodgkin's lymphoma Sister     ROS:  Pertinent items are noted in HPI.  Otherwise, a comprehensive ROS was negative.  Exam:   BP 104/68   Pulse 68   Resp 16   Ht 5\' 5"  (1.651 m)   Wt 134 lb (60.8 kg)   LMP 03/05/1997   BMI 22.30 kg/m  Height: 5\' 5"  (165.1 cm) Ht Readings from Last 3 Encounters:  06/19/17 5\' 5"  (1.651 m)  04/22/17 5\' 5"  (1.651 m)  04/09/17 5\' 5"  (1.651 m)    General appearance: alert,  cooperative and appears stated age Head: Normocephalic, without obvious abnormality, atraumatic Neck: no adenopathy, supple, symmetrical, trachea midline and thyroid normal to inspection and palpation Lungs: clear to auscultation bilaterally Breasts: normal appearance, no masses or tenderness, No nipple retraction or dimpling, No nipple discharge or bleeding, No axillary or supraclavicular adenopathy, surgical scarring on right Heart: regular rate and rhythm Abdomen: soft, non-tender; no masses,  no organomegaly Extremities: extremities normal, atraumatic, no cyanosis or edema Skin: Skin color, texture, turgor normal. No rashes or lesions Lymph nodes: Cervical, supraclavicular, and axillary nodes normal. No abnormal inguinal nodes palpated Neurologic: Grossly normal   Pelvic: External genitalia:  no lesions              Urethra:  normal appearing urethra with no masses, tenderness or lesions              Bartholin's and Skene's: normal                 Vagina: normal appearing vagina with normal color and discharge, no lesions              Cervix: absent              Pap taken: No. Bimanual Exam:  Uterus:  uterus absent              Adnexa: no mass, fullness, tenderness and adnexa surgically absent               Rectovaginal: Confirms               Anus:  normal sphincter tone, no lesions  Chaperone present: yes  A:  Well Woman with normal exam  Menopausal no HRT s/p LAVH with BSO  Atrophic vaginitis, using coconut oil with good results  Alopecia thinning request use of Finasteride per dermatology  History of Breast cancer  MD management of hypothyroid/cholesterol  Recent right knee replacement, doing well  P:   Reviewed health and wellness pertinent to exam  Aware of vaginal changes with atrophy and dryness, will continue coconut oil as indicated  Will check information for patient and let her decide if she would like to use  Continue follow up with mammograms and SBE  Continue  follow up with MD as indicated.  Pap smear: no  counseled on breast self exam, mammography screening, feminine hygiene, adequate intake of calcium and vitamin D, diet and exercise  return annually or prn  An After Visit Summary was printed and given to the patient.

## 2017-06-24 ENCOUNTER — Telehealth: Payer: Self-pay | Admitting: Certified Nurse Midwife

## 2017-06-24 ENCOUNTER — Encounter: Payer: Self-pay | Admitting: Certified Nurse Midwife

## 2017-06-24 NOTE — Telephone Encounter (Signed)
Patient sent the following correspondence through [facility]. Routing to triage to assist patient with request.  ----- Message from [place], Generic sent at 06/24/2017 3:00 PM EDT -----    Hi, Debbi ~ Just circling back on the possibility of taking Finasteride as recommended by Dr. Rolm Bookbinder(?). Thanks!!

## 2017-06-24 NOTE — Telephone Encounter (Signed)
Routing to Melvia Heaps, CNM -please review and advise?

## 2017-06-25 NOTE — Telephone Encounter (Signed)
Spoke with patient, advised as seen below per Melvia Heaps, CNM. Patient states she will f/u with Dr. Ubaldo Glassing, thankful for return call. Will close encounter.

## 2017-06-25 NOTE — Telephone Encounter (Signed)
I researched on Up to date and because it is used for men,very little information regarding use in women. No information listed in all the resources I checked regarding previous breast cancer. She will need to discuss further with Dr. Ubaldo Glassing. I can not recommend this due to limited information. It would be her decision to use.

## 2017-08-01 ENCOUNTER — Encounter: Payer: Self-pay | Admitting: Family Medicine

## 2017-09-17 ENCOUNTER — Other Ambulatory Visit: Payer: Self-pay | Admitting: Family Medicine

## 2017-09-17 NOTE — Telephone Encounter (Signed)
Refill denied. Pt has completed course of treatment.  

## 2017-09-21 ENCOUNTER — Other Ambulatory Visit: Payer: Self-pay | Admitting: Family Medicine

## 2017-12-18 ENCOUNTER — Other Ambulatory Visit: Payer: Self-pay | Admitting: Family Medicine

## 2017-12-18 DIAGNOSIS — Z1231 Encounter for screening mammogram for malignant neoplasm of breast: Secondary | ICD-10-CM

## 2017-12-18 NOTE — Telephone Encounter (Signed)
Refill denied. Pt has completed course of treatment.  

## 2017-12-19 ENCOUNTER — Other Ambulatory Visit: Payer: Self-pay | Admitting: Family Medicine

## 2017-12-19 NOTE — Telephone Encounter (Signed)
Refill denied. Pt has not been seen since 12/2016.

## 2018-02-03 ENCOUNTER — Ambulatory Visit: Payer: Self-pay

## 2018-03-07 ENCOUNTER — Ambulatory Visit: Payer: Self-pay

## 2018-03-10 ENCOUNTER — Ambulatory Visit
Admission: RE | Admit: 2018-03-10 | Discharge: 2018-03-10 | Disposition: A | Discharge status: Home / Self Care | Payer: Medicare Other | Source: Ambulatory Visit | Attending: Family Medicine | Admitting: Family Medicine

## 2018-03-10 DIAGNOSIS — Z1231 Encounter for screening mammogram for malignant neoplasm of breast: Secondary | ICD-10-CM

## 2018-04-02 ENCOUNTER — Other Ambulatory Visit: Payer: Self-pay | Admitting: Family Medicine

## 2018-04-02 DIAGNOSIS — R945 Abnormal results of liver function studies: Secondary | ICD-10-CM

## 2018-04-02 DIAGNOSIS — R7989 Other specified abnormal findings of blood chemistry: Secondary | ICD-10-CM

## 2018-04-07 ENCOUNTER — Ambulatory Visit
Admission: RE | Admit: 2018-04-07 | Discharge: 2018-04-07 | Disposition: A | Discharge status: Home / Self Care | Payer: Medicare Other | Source: Ambulatory Visit | Attending: Family Medicine | Admitting: Family Medicine

## 2018-04-07 DIAGNOSIS — R7989 Other specified abnormal findings of blood chemistry: Secondary | ICD-10-CM

## 2018-04-07 DIAGNOSIS — R945 Abnormal results of liver function studies: Secondary | ICD-10-CM

## 2018-04-15 ENCOUNTER — Other Ambulatory Visit: Payer: Self-pay | Admitting: Family Medicine

## 2018-04-15 DIAGNOSIS — R945 Abnormal results of liver function studies: Principal | ICD-10-CM

## 2018-04-15 DIAGNOSIS — R7989 Other specified abnormal findings of blood chemistry: Secondary | ICD-10-CM

## 2018-04-22 ENCOUNTER — Ambulatory Visit
Admission: RE | Admit: 2018-04-22 | Discharge: 2018-04-22 | Disposition: A | Discharge status: Home / Self Care | Payer: Medicare Other | Source: Ambulatory Visit | Attending: Family Medicine | Admitting: Family Medicine

## 2018-04-22 DIAGNOSIS — R945 Abnormal results of liver function studies: Principal | ICD-10-CM

## 2018-04-22 DIAGNOSIS — R7989 Other specified abnormal findings of blood chemistry: Secondary | ICD-10-CM

## 2018-05-06 ENCOUNTER — Encounter: Payer: Self-pay | Admitting: Sports Medicine

## 2018-05-06 ENCOUNTER — Ambulatory Visit: Payer: Medicare Other | Admitting: Sports Medicine

## 2018-05-06 VITALS — BP 140/82 | Ht 65.5 in | Wt 135.0 lb

## 2018-05-06 DIAGNOSIS — Z96651 Presence of right artificial knee joint: Secondary | ICD-10-CM | POA: Insufficient documentation

## 2018-05-06 NOTE — Progress Notes (Signed)
[name] - 68 y.o. female MRN [id number]  Date of birth: 1950-11-27   Chief complaint: R Knee pain  SUBJECTIVE:    History of present illness: 68 year old female that presents today with a chief complaint of right knee pain.  She is status post right total knee arthroplasty in February 2019 by Dr. Mayer Camel.  She states since her total knee arthroplasty, she has had continued anterior and medial knee pain.  She is not currently pleased with the results of surgery.  She feels stiffness and a dull ache in her knee constantly.  It occasionally wakes her up at night.  She does at times have difficulty with full flexion of her knee.  She describes this feeling as tightness.  She rates her pain 3 out of 10 and it is associated with swelling.  She last saw the orthopedic surgeon several weeks ago who performed x-rays and ordered blood work.  The x-rays were negative for hardware loosening or failure.  Her blood work was negative for infection.  She did not have inflammatory markers drawn at that time.  The plan for her was to have a bone scan of her knee however she has not currently had it yet. Denies fevers, chills, or night sweats. She states occasionally her knee feels like it locks on her. No feeling of instability. No low back pain. No numbness or tingling of the extremity.  In terms of activity, she has been biking up to 18 miles 3 times per week.  She also has been doing deep squats with 25 pound kettle bells under the supervision of a Physiological scientist.   Review of systems:  As stated above   Interval past medical history, surgical history, family history, and social history obtained and are unchanged.   Of note, she has a history of spinal stenosis and osteoporosis..  She also has a surgical history of a total knee arthroplasty in February 2019.  She is scheduled to have her left total knee done soon.  She is a non-smoker.  Medications reviewed and unchanged.  Of note, she is on Prolia.  She also  takes vitamin D and calcium. Allergies reviewed and unchanged.  Of note she is allergic to gabapentin.  OBJECTIVE:  Physical exam: Vital signs are reviewed. BP 140/82   Ht 5' 5.5" (1.664 m)   Wt 135 lb (61.2 kg)   LMP 03/05/1997   BMI 22.12 kg/m   Gen.: Alert, oriented, appears stated age, in no apparent distress Integumentary: No rashes, slight increase in palpable warmth of her right knee Neurologic:  Sensation is intact to light touch L4-S1 on the right Gait: Trace limp favoring the right side Musculoskeletal: Inspection of the right knee demonstrates mild swelling.  She has tenderness to palpation over her medial joint line.  Her range of motion is adequate 0 degrees of extension to 130 degrees of knee flexion.  Strength testing is 5 out of 5 in knee flexion and extension.  Hardware feels stable to anterior/posterior translation.  Knee is stable to varus valgus stress.  ULTRASOUND: Knee, right Diagnostic Limited ultrasound imaging obtained of patient's R knee. Notable for metal plating c/w total knee replacement which limits some of the examination today - Quadriceps tendon: No appreciated signs of tearing. Significant large pocket of hypoechoic fluid noted within the suprapatellar pouch extending laterally just deep to the vastus lateralis.  - Patellar tendon: No appreciated signs of tearing, edema, or calcification. No infrapatellar or tibial tuberosity fluid or abnormality appreciated.  -  Medial joint line: prosthesis edge visualized without abnormal fluid presence or osteophytes - Lateral joint line: Prosthesis edge also visualized with moderate hypoechoic fluid - MCL: No evidence of integrity loss or abnormal fluid presence.  - LCL: No evidence of integrity loss or abnormal fluid presence.  - Pes Anserine: Mild to moderate hypoechoic fluid at the attachment of the sartorius, gracilis and semitendinosus tendons IMPRESSION: findings consistent with moderate to large joint effusion,  Pes anserine bursitis.  Ultrasound and interpretation by Dr. Lunette Stands and Wolfgang Phoenix. Fields, MD      ASSESSMENT & PLAN: S/P total knee arthroplasty, right -Ongoing pain even 1 year after knee replacement -Reviewed laboratory work which was negative for infection -Unable to review the x-rays however read the report demonstrating no evidence of hardware failure or loosening -At this point, I do recommend completing the work-up for hardware loosening/periprosthetic fracture.  This would include a ESR, CRP, and bone scan.  The patient states she will get this ordered at her orthopod's office.  I do recommend that she see Dr. Mayer Camel when he is available as he performed the surgery. -I am recommending a compression sleeve to help with the swelling.  I am also recommending hamstring tendon strengthening exercises to be performed to help with her ongoing bursitis.  I recommend against dry needling treatment modalities given that she has hardware in her knee.  Continue biking Modify workouts - only body weight for squats Work on 1 leg balance on RT  Clydene Laming, [place]  I observed and examined the patient with the [facility] and agree with assessment and plan.  Note reviewed and modified by me. Ila Mcgill, MD

## 2018-05-06 NOTE — Assessment & Plan Note (Signed)
-  Ongoing pain even 1 year after knee replacement -Reviewed laboratory work which was negative for infection -Unable to review the x-rays however read the report demonstrating no evidence of hardware failure or loosening -At this point, I do recommend completing the work-up for hardware loosening/periprosthetic fracture.  This would include a ESR, CRP, and bone scan.  The patient states she will get this ordered at her orthopod's office.  I do recommend that she see Dr. Mayer Camel when he is available as he performed the surgery. -I am recommending a compression sleeve to help with the swelling.  I am also recommending hamstring tendon strengthening exercises to be performed to help with her ongoing bursitis.  I recommend against dry needling treatment modalities given that she has hardware in her knee.

## 2018-05-15 ENCOUNTER — Other Ambulatory Visit (HOSPITAL_COMMUNITY): Payer: Self-pay | Admitting: Orthopedic Surgery

## 2018-05-15 ENCOUNTER — Other Ambulatory Visit: Payer: Self-pay | Admitting: Orthopedic Surgery

## 2018-05-15 DIAGNOSIS — M25561 Pain in right knee: Secondary | ICD-10-CM

## 2018-05-23 ENCOUNTER — Encounter (HOSPITAL_COMMUNITY): Payer: Medicare Other

## 2018-05-23 ENCOUNTER — Ambulatory Visit (HOSPITAL_COMMUNITY): Payer: Medicare Other

## 2018-06-20 ENCOUNTER — Ambulatory Visit (HOSPITAL_COMMUNITY)
Admission: RE | Admit: 2018-06-20 | Discharge: 2018-06-20 | Disposition: A | Discharge status: Home / Self Care | Payer: Medicare Other | Source: Ambulatory Visit | Attending: Orthopedic Surgery | Admitting: Orthopedic Surgery

## 2018-06-20 ENCOUNTER — Other Ambulatory Visit: Payer: Self-pay

## 2018-06-20 DIAGNOSIS — M25561 Pain in right knee: Secondary | ICD-10-CM | POA: Insufficient documentation

## 2018-06-20 MED ORDER — TECHNETIUM TC 99M MEDRONATE IV KIT
20.0000 | PACK | Freq: Once | INTRAVENOUS | Status: AC | PRN
  Administered 2018-06-20: 20 via INTRAVENOUS

## 2018-06-25 ENCOUNTER — Ambulatory Visit: Payer: Medicare Other | Admitting: Certified Nurse Midwife

## 2018-09-09 ENCOUNTER — Ambulatory Visit (INDEPENDENT_AMBULATORY_CARE_PROVIDER_SITE_OTHER): Payer: Medicare Other | Admitting: Certified Nurse Midwife

## 2018-09-09 ENCOUNTER — Other Ambulatory Visit: Payer: Self-pay

## 2018-09-09 ENCOUNTER — Encounter: Payer: Self-pay | Admitting: Certified Nurse Midwife

## 2018-09-09 VITALS — BP 120/70 | HR 68 | Temp 97.2°F | Resp 16 | Ht 65.25 in | Wt 134.0 lb

## 2018-09-09 DIAGNOSIS — N393 Stress incontinence (female) (male): Secondary | ICD-10-CM | POA: Diagnosis not present

## 2018-09-09 DIAGNOSIS — N952 Postmenopausal atrophic vaginitis: Secondary | ICD-10-CM | POA: Diagnosis not present

## 2018-09-09 DIAGNOSIS — Z01419 Encounter for gynecological examination (general) (routine) without abnormal findings: Secondary | ICD-10-CM

## 2018-09-09 NOTE — Progress Notes (Signed)
68 y.o. G29P2002 Married  Caucasian Fe here for annual exam. Post menopausal with occasional incontinence upon arising and" key in the door" urgency for  urination. Denies vaginal dryness or urinary symptoms of infection. No burning or back pain. Sees PCP Serita Grammes yearly for medication management and aex/labs. Staying busy with spouse. No other health issues today.  Patient's last menstrual period was 03/05/1997.          Sexually active: No.  The current method of family planning is status post hysterectomy.    Exercising: Yes.    physical labor at work Smoker:  no  Review of Systems  Constitutional: Negative.   HENT: Negative.   Eyes: Negative.   Respiratory: Negative.   Cardiovascular: Negative.   Gastrointestinal: Negative.   Genitourinary:       Urinary incontinence  Musculoskeletal: Negative.   Skin: Negative.   Neurological: Negative.   Endo/Heme/Allergies: Negative.   Psychiatric/Behavioral: Negative.     Health Maintenance: Pap:  8/11 neg History of Abnormal Pap: no MMG:  03-10-2018 category c density birads 1:neg Self Breast exams: yes Colonoscopy:  Within last 20yrs BMD:   2019 TDaP:  2014 Shingles: had done Pneumonia: had done Hep C and HIV: unsure Labs: no   reports that she has never smoked. She has never used smokeless tobacco. She reports current alcohol use of about 6.0 - 8.0 standard drinks of alcohol per week. She reports that she does not use drugs.  Past Medical History:  Diagnosis Date  . Breast cancer, right breast ([place]) 11/1997   S/P lumphectomy, chemo,  and radiation therapy  . Broken arm 6/16   right  . Hypercholesteremia   . Hypothyroidism   . Osteoarthritis    "knees; maybe in my lower back" (04/23/2017)  . Personal history of chemotherapy   . Personal history of radiation therapy   . Thyroid disease    hypothyroid    Past Surgical History:  Procedure Laterality Date  . BLADDER SUSPENSION  02/2002   sparc  . BREAST BIOPSY Right  1999  . BREAST LUMPECTOMY Right 11/1997  . JOINT REPLACEMENT    . MOUTH SURGERY     "dental implants"  . NASAL SINUS SURGERY  12/2015  . TOTAL KNEE ARTHROPLASTY Right 04/22/2017   Procedure: TOTAL KNEE ARTHROPLASTY;  Surgeon: Frederik Pear, MD;  Location: [facility];  Service: Orthopedics;  Laterality: Right;  . TUBAL LIGATION    . VAGINAL HYSTERECTOMY  02/2002   LAVH-BSO    Current Outpatient Medications  Medication Sig Dispense Refill  . ALPRAZolam (XANAX) 0.5 MG tablet Take 0.25-0.5 mg by mouth 2 (two) times daily as needed for anxiety.    . Biotin 10 MG TABS Take by mouth.    . Biotin 10000 MCG TABS Take 1 tablet by mouth daily.     . Coenzyme Q10 (COQ10) 400 MG CAPS Take 1 capsule by mouth daily.    Marland Kitchen denosumab (PROLIA) 60 MG/ML SOLN injection Inject 60 mg into the skin every 6 (six) months. Administer in upper arm, thigh, or abdomen    . finasteride (PROSCAR) 5 MG tablet 1 TABLET BY MOUTH DAILY TAKE 1 TABLET BY MOUTH DAILY    . levothyroxine (SYNTHROID, LEVOTHROID) 75 MCG tablet Take 75 mcg by mouth daily before breakfast.     . Multiple Vitamins-Minerals (MULTIVITAMIN PO) Take 1 tablet by mouth daily.     . Omega-3 Fatty Acids (FISH OIL) 1200 MG CAPS Take 1 capsule by mouth daily.     Marland Kitchen  simvastatin (ZOCOR) 20 MG tablet Take 20 mg by mouth every evening.  2  . temazepam (RESTORIL) 15 MG capsule Take 15 mg by mouth at bedtime as needed for sleep.   0   No current facility-administered medications for this visit.     Family History  Problem Relation Age of Onset  . Rheum arthritis Mother   . Non-Hodgkin's lymphoma Sister     ROS:  Pertinent items are noted in HPI.  Otherwise, a comprehensive ROS was negative.  Exam:   BP 120/70   Pulse 68   Temp (!) 97.2 F (36.2 C) (Skin)   Resp 16   Ht 5' 5.25" (1.657 m)   Wt 134 lb (60.8 kg)   LMP 03/05/1997   BMI 22.13 kg/m  Height: 5' 5.25" (165.7 cm) Ht Readings from Last 3 Encounters:  09/09/18 5' 5.25" (1.657 m)  05/06/18 5'  5.5" (1.664 m)  06/19/17 5\' 5"  (1.651 m)    General appearance: alert, cooperative and appears stated age Head: Normocephalic, without obvious abnormality, atraumatic Neck: no adenopathy, supple, symmetrical, trachea midline and thyroid normal to inspection and palpation Lungs: clear to auscultation bilaterally Breasts: normal appearance, no masses or tenderness, No nipple retraction or dimpling, No nipple discharge or bleeding, No axillary or supraclavicular adenopathy Heart: regular rate and rhythm Abdomen: soft, non-tender; no masses,  no organomegaly Extremities: extremities normal, atraumatic, no cyanosis or edema Skin: Skin color, texture, turgor normal. No rashes or lesions Lymph nodes: Cervical, supraclavicular, and axillary nodes normal. No abnormal inguinal nodes palpated Neurologic: Grossly normal   Pelvic: External genitalia:  no lesions              Urethra:  normal appearing urethra with no masses, tenderness or lesions              Bartholin's and Skene's: normal                 Vagina: atrophic appearing vagina with pale color and scant discharge, no lesions, good pelvic floor tone              Cervix: absent              Pap taken: No. Bimanual Exam:  Uterus:  uterus absent              Adnexa: no mass, fullness, tenderness and adnexa surgically removed               Rectovaginal: Confirms               Anus:  normal sphincter tone, no lesions  Chaperone present: yes  A:  Well Woman with normal exam  Post menopausal no HRT  Atrophic vaginitis  Sporadic urinary incontinence ,no UTI  History of Breast cancer right  Osteoporosis/hypothyroid with PCP management  P:   Reviewed health and wellness pertinent to exam  Discussed vaginal finding and dryness, which could be increasing stress incontinence. Discussed Replens OTC and coconut oil for moisture. Questions addressed. Also discussed kegel exercise and PT for pelvic floor evaluation. Given information  Regarding.  Will advise if no change.  Continue follow up with MD as indicated  Pap smear: no  counseled on breast self exam, mammography screening, feminine hygiene, adequate intake of calcium and vitamin D, diet and exercise, Kegel's exercises  return annually or prn  An After Visit Summary was printed and given to the patient.

## 2018-09-09 NOTE — Patient Instructions (Signed)
Atrophic Vaginitis Atrophic vaginitis is a condition in which the tissues that line the vagina become dry and thin. This condition occurs in women who have stopped having their period. It is caused by a drop in a female hormone (estrogen). This hormone helps:  To keep the vagina moist.  To make a clear fluid. This clear fluid helps: ? To make the vagina ready for sex. ? To protect the vagina from infection. If the lining of the vagina is dry and thin, it may cause irritation, burning, or itchiness. It may also:  Make sex painful.  Make an exam of your vagina painful.  Cause bleeding.  Make you lose interest in sex.  Cause a burning feeling when you pee (urinate).  Cause a brown or yellow fluid to come from your vagina. Some women do not have symptoms. Follow these instructions at home: Medicines  Take over-the-counter and prescription medicines only as told by your doctor.  Do not use herbs or other medicines unless your doctor says it is okay.  Use medicines for for dryness. These include: ? Oils to make the vagina soft. ? Creams. ? Moisturizers. General instructions  Do not douche.  Do not use products that can make your vagina dry. These include: ? Scented sprays. ? Scented tampons. ? Scented soaps.  Sex can help increase blood flow and soften the tissue in the vagina. If it hurts to have sex: ? Tell your partner. ? Use products to make sex more comfortable. Use these only as told by your doctor. Contact a doctor if you:  Have discharge from the vagina that is different than usual.  Have a bad smell coming from your vagina.  Have new symptoms.  Do not get better.  Get worse. Summary  Atrophic vaginitis is a condition in which the lining of the vagina becomes dry and thin.  This condition affects women who have stopped having their periods.  Treatment may include using products that help make the vagina soft.  Call a doctor if do not get better with  treatment. This information is not intended to replace advice given to you by your health care provider. Make sure you discuss any questions you have with your health care provider. Document Released: 08/08/2007 Document Revised: 03/04/2017 Document Reviewed: 03/04/2017 Elsevier Patient Education  2020 Elsevier Inc.  

## 2018-11-17 ENCOUNTER — Ambulatory Visit (INDEPENDENT_AMBULATORY_CARE_PROVIDER_SITE_OTHER): Payer: Medicare Other | Admitting: Family Medicine

## 2018-11-17 ENCOUNTER — Other Ambulatory Visit: Payer: Self-pay

## 2018-11-17 ENCOUNTER — Encounter: Payer: Self-pay | Admitting: Family Medicine

## 2018-11-17 VITALS — BP 146/70 | HR 91 | Ht 65.0 in | Wt 137.0 lb

## 2018-11-17 DIAGNOSIS — M25561 Pain in right knee: Secondary | ICD-10-CM

## 2018-11-17 DIAGNOSIS — T8484XA Pain due to internal orthopedic prosthetic devices, implants and grafts, initial encounter: Secondary | ICD-10-CM | POA: Insufficient documentation

## 2018-11-17 DIAGNOSIS — M25562 Pain in left knee: Secondary | ICD-10-CM | POA: Diagnosis not present

## 2018-11-17 DIAGNOSIS — Z96659 Presence of unspecified artificial knee joint: Secondary | ICD-10-CM

## 2018-11-17 DIAGNOSIS — G8929 Other chronic pain: Secondary | ICD-10-CM

## 2018-11-17 NOTE — Patient Instructions (Signed)
Good to see you Read about PRP See [place] when you need [place]

## 2018-11-17 NOTE — Progress Notes (Signed)
[name] [name], [place] [address] Phone: [phone] Subjective:   I [name] am serving as a Education administrator for Dr. Hulan Saas.  I'm seeing this patient by the request  of:    CC: Right knee pain  [id number]  [name] is a 68 y.o. female coming in with complaint of right knee pain. Patient wants consultation post knee replacement. February 2019 replacement. Still having issues with pain. Has had a number of different test done since the replacement. Arthrofibrosis of the scar tissue. Started working 15-20 hours a week of tedious heavy duty exercise. States the knee got better after that but she feels "clunking" at night. Not sure if now she should get the left one done. Wants to know if she needs arthroscopic surgery etc. States the knee doesn't feel stable.   Onset- Chronic    Right total knee replacement in February 2019 by Dr. Mayer Camel continues to have pain since.  Bone scan has been done that did not show any loosening seen of the arthroplasty.  Past Medical History:  Diagnosis Date  . Breast cancer, right breast ([place]) 11/1997   S/P lumphectomy, chemo,  and radiation therapy  . Broken arm 6/16   right  . Hypercholesteremia   . Hypothyroidism   . Osteoarthritis    "knees; maybe in my lower back" (04/23/2017)  . Personal history of chemotherapy   . Personal history of radiation therapy   . Thyroid disease    hypothyroid   Past Surgical History:  Procedure Laterality Date  . BLADDER SUSPENSION  02/2002   sparc  . BREAST BIOPSY Right 1999  . BREAST LUMPECTOMY Right 11/1997  . JOINT REPLACEMENT    . MOUTH SURGERY     "dental implants"  . NASAL SINUS SURGERY  12/2015  . TOTAL KNEE ARTHROPLASTY Right 04/22/2017   Procedure: TOTAL KNEE ARTHROPLASTY;  Surgeon: Frederik Pear, MD;  Location: [facility];  Service: Orthopedics;  Laterality: Right;  . TUBAL LIGATION    . VAGINAL HYSTERECTOMY  02/2002   LAVH-BSO   Social History    Socioeconomic History  . Marital status: Married    Spouse name: Not on file  . Number of children: Not on file  . Years of education: Not on file  . Highest education level: Not on file  Occupational History  . Not on file  Social Needs  . Financial resource strain: Not on file  . Food insecurity    Worry: Not on file    Inability: Not on file  . Transportation needs    Medical: Not on file    Non-medical: Not on file  Tobacco Use  . Smoking status: Never Smoker  . Smokeless tobacco: Never Used  Substance and Sexual Activity  . Alcohol use: Yes    Alcohol/week: 6.0 - 8.0 standard drinks    Types: 6 - 8 Standard drinks or equivalent per week  . Drug use: No  . Sexual activity: Not Currently    Partners: Male    Birth control/protection: Surgical    Comment: LAVH  Lifestyle  . Physical activity    Days per week: Not on file    Minutes per session: Not on file  . Stress: Not on file  Relationships  . Social Herbalist on phone: Not on file    Gets together: Not on file    Attends religious service: Not on file    Active member of  club or organization: Not on file    Attends meetings of clubs or organizations: Not on file    Relationship status: Not on file  Other Topics Concern  . Not on file  Social History Narrative  . Not on file   Allergies  Allergen Reactions  . Gabapentin Other (See Comments)    Gave pt migraine   Family History  Problem Relation Age of Onset  . Rheum arthritis Mother   . Non-Hodgkin's lymphoma Sister     Current Outpatient Medications (Endocrine & Metabolic):  .  denosumab (PROLIA) 60 MG/ML SOLN injection, Inject 60 mg into the skin every 6 (six) months. Administer in upper arm, thigh, or abdomen .  levothyroxine (SYNTHROID, LEVOTHROID) 75 MCG tablet, Take 75 mcg by mouth daily before breakfast.   Current Outpatient Medications (Cardiovascular):  .  simvastatin (ZOCOR) 20 MG tablet, Take 20 mg by mouth every evening.      Current Outpatient Medications (Other):  Marland Kitchen  ALPRAZolam (XANAX) 0.5 MG tablet, Take 0.25-0.5 mg by mouth 2 (two) times daily as needed for anxiety. .  Biotin 10000 MCG TABS, Take 1 tablet by mouth daily.  Marland Kitchen  CALCIUM PO, Take by mouth. .  Coenzyme Q10 (COQ10) 400 MG CAPS, Take 1 capsule by mouth daily. .  finasteride (PROSCAR) 5 MG tablet, 1 TABLET BY MOUTH DAILY TAKE 1 TABLET BY MOUTH DAILY .  Multiple Vitamins-Minerals (MULTIVITAMIN PO), Take 1 tablet by mouth daily.  .  Omega-3 Fatty Acids (FISH OIL) 1200 MG CAPS, Take 1 capsule by mouth daily.  .  temazepam (RESTORIL) 15 MG capsule, Take 15 mg by mouth at bedtime as needed for sleep.  Marland Kitchen  VITAMIN D PO, Take by mouth.    Past medical history, social, surgical and family history all reviewed in electronic medical record.  No pertanent information unless stated regarding to the chief complaint.   Review of Systems:  No headache, visual changes, nausea, vomiting, diarrhea, constipation, dizziness, abdominal pain, skin rash, fevers, chills, night sweats, weight loss, swollen lymph nodes, body aches, joint swelling, muscle aches, chest pain, shortness of breath, mood changes.   Objective  Blood pressure (!) 146/70, pulse 91, height 5\' 5"  (1.651 m), weight 137 lb (62.1 kg), last menstrual period 03/05/1997, SpO2 98 %.     General: No apparent distress alert and oriented x3 mood and affect normal, dressed appropriately.  HEENT: Pupils equal, extraocular movements intact  Respiratory: Patient's speak in full sentences and does not appear short of breath  Cardiovascular: No lower extremity edema, non tender, no erythema  Skin: Warm dry intact with no signs of infection or rash on extremities or on axial skeleton.  Abdomen: Soft nontender  Neuro: Cranial nerves II through XII are intact, neurovascularly intact in all extremities with 2+ DTRs and 2+ pulses.  Lymph: No lymphadenopathy of posterior or anterior cervical chain or axillae  bilaterally.  Gait mild antalgic MSK:  tender with full range of motion and good stability and symmetric strength and tone of shoulders, elbows, wrist, hip and ankles bilaterally.  Right knee exam does show replacement.  Trace effusion potentially noted.  Lacks last 2 degrees of extension and has 95 degrees of flexion.  Appears to be stable on exam no.  Contralateral knee does have arthritic changes with instability noticed with valgus force.      Impression and Recommendations:     This case required medical decision making of moderate complexity. The above documentation has been reviewed and is accurate  and complete Lyndal Pulley, DO       Note: This dictation was prepared with Dragon dictation along with smaller phrase technology. Any transcriptional errors that result from this process are unintentional.

## 2018-11-17 NOTE — Assessment & Plan Note (Signed)
Patient continues to have discomfort and pain but does have near full range of motion of what would be anticipated.  On exam today no significant instability.  Bone scan that was independently visualized by me also did not show any loosening.  At this point patient has done everything else at the moment.  We discussed the possibility of patient seen another physician for second opinion about the arthroscopic evaluation.  Patient will be referred today otherwise encouraged her to follow-up with her orthopedic surgeon otherwise.  Patient does have left knee pain as well and arthritis but patient wants to avoid any other injections at the moment.

## 2018-11-20 ENCOUNTER — Encounter: Payer: Self-pay | Admitting: Family Medicine

## 2018-12-18 NOTE — Progress Notes (Addendum)
[name] [place], [place] [address] Phone: [phone] Subjective:   I [name] am serving as a Education administrator for Dr. Hulan Saas.    CC: Bilateral knee pain  [id number]   11/17/2018 Patient continues to have discomfort and pain but does have near full range of motion of what would be anticipated.  On exam today no significant instability.  Bone scan that was independently visualized by me also did not show any loosening.  At this point patient has done everything else at the moment.  We discussed the possibility of patient seen another physician for second opinion about the arthroscopic evaluation.  Patient will be referred today otherwise encouraged her to follow-up with her orthopedic surgeon otherwise.  Patient does have left knee pain as well and arthritis but patient wants to avoid any other injections at the moment.  12/19/2018 [name] is a 68 y.o. female coming in with complaint of bilateral knee pain. States the knees are not doing well. Worsening pain in comparison to last visit. Injured left knee at work 2 weeks ago. 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 Intermitted pain. Posterior lateral pain. Bilateral achy pain. Patient states she wants to be active again.       Past Medical History:  Diagnosis Date  . Breast cancer, right breast ([place]) 11/1997   S/P lumphectomy, chemo,  and radiation therapy  . Broken arm 6/16   right  . Hypercholesteremia   . Hypothyroidism   . Osteoarthritis    "knees; maybe in my lower back" (04/23/2017)  . Personal history of chemotherapy   . Personal history of radiation therapy   . Thyroid disease    hypothyroid   Past Surgical History:  Procedure Laterality Date  . BLADDER SUSPENSION  02/2002   sparc  . BREAST BIOPSY Right 1999  . BREAST LUMPECTOMY Right 11/1997  . JOINT REPLACEMENT    . MOUTH SURGERY     "dental  implants"  . NASAL SINUS SURGERY  12/2015  . TOTAL KNEE ARTHROPLASTY Right 04/22/2017   Procedure: TOTAL KNEE ARTHROPLASTY;  Surgeon: Frederik Pear, MD;  Location: [facility];  Service: Orthopedics;  Laterality: Right;  . TUBAL LIGATION    . VAGINAL HYSTERECTOMY  02/2002   LAVH-BSO   Social History   Socioeconomic History  . Marital status: Married    Spouse name: Not on file  . Number of children: Not on file  . Years of education: Not on file  . Highest education level: Not on file  Occupational History  . Not on file  Social Needs  . Financial resource strain: Not on file  . Food insecurity    Worry: Not on file    Inability: Not on file  . Transportation needs    Medical: Not on file    Non-medical: Not on file  Tobacco Use  . Smoking status: Never Smoker  . Smokeless tobacco: Never Used  Substance and Sexual Activity  . Alcohol use: Yes    Alcohol/week: 6.0 - 8.0 standard drinks    Types: 6 - 8 Standard drinks or equivalent per week  . Drug use: No  . Sexual activity: Not Currently    Partners: Male    Birth control/protection: Surgical    Comment: LAVH  Lifestyle  . Physical activity    Days per week: Not on file    Minutes  per session: Not on file  . Stress: Not on file  Relationships  . Social Herbalist on phone: Not on file    Gets together: Not on file    Attends religious service: Not on file    Active member of club or organization: Not on file    Attends meetings of clubs or organizations: Not on file    Relationship status: Not on file  Other Topics Concern  . Not on file  Social History Narrative  . Not on file   Allergies  Allergen Reactions  . Gabapentin Other (See Comments)    Gave pt migraine   Family History  Problem Relation Age of Onset  . Rheum arthritis Mother   . Non-Hodgkin's lymphoma Sister     Current Outpatient Medications (Endocrine & Metabolic):  .  denosumab (PROLIA) 60 MG/ML SOLN injection, Inject 60 mg into the  skin every 6 (six) months. Administer in upper arm, thigh, or abdomen .  levothyroxine (SYNTHROID, LEVOTHROID) 75 MCG tablet, Take 75 mcg by mouth daily before breakfast.   Current Outpatient Medications (Cardiovascular):  .  simvastatin (ZOCOR) 20 MG tablet, Take 20 mg by mouth every evening.     Current Outpatient Medications (Other):  Marland Kitchen  ALPRAZolam (XANAX) 0.5 MG tablet, Take 0.25-0.5 mg by mouth 2 (two) times daily as needed for anxiety. .  Biotin 10000 MCG TABS, Take 1 tablet by mouth daily.  Marland Kitchen  CALCIUM PO, Take by mouth. .  Coenzyme Q10 (COQ10) 400 MG CAPS, Take 1 capsule by mouth daily. .  finasteride (PROSCAR) 5 MG tablet, 1 TABLET BY MOUTH DAILY TAKE 1 TABLET BY MOUTH DAILY .  Multiple Vitamins-Minerals (MULTIVITAMIN PO), Take 1 tablet by mouth daily.  .  Omega-3 Fatty Acids (FISH OIL) 1200 MG CAPS, Take 1 capsule by mouth daily.  .  temazepam (RESTORIL) 15 MG capsule, Take 15 mg by mouth at bedtime as needed for sleep.  Marland Kitchen  VITAMIN D PO, Take by mouth.    Past medical history, social, surgical and family history all reviewed in electronic medical record.  No pertanent information unless stated regarding to the chief complaint.   Review of Systems:  No headache, visual changes, nausea, vomiting, diarrhea, constipation, dizziness, abdominal pain, skin rash, fevers, chills, night sweats, weight loss, swollen lymph nodes, body aches, joint swelling,  chest pain, shortness of breath, mood changes.  Positive muscle aches  Objective  Blood pressure 140/70, pulse 79, height 5\' 5"  (1.651 m), weight 137 lb (62.1 kg), last menstrual period 03/05/1997, SpO2 98 %.    General: No apparent distress alert and oriented x3 mood and affect normal, dressed appropriately.  HEENT: Pupils equal, extraocular movements intact  Respiratory: Patient's speak in full sentences and does not appear short of breath  Cardiovascular: No lower extremity edema, non tender, no erythema  Skin: Warm dry intact  with no signs of infection or rash on extremities or on axial skeleton.  Abdomen: Soft nontender  Neuro: Cranial nerves II through XII are intact, neurovascularly intact in all extremities with 2+ DTRs and 2+ pulses.  Lymph: No lymphadenopathy of posterior or anterior cervical chain or axillae bilaterally.  Gait antalgic MSK:  tender with full range of motion and good stability and symmetric strength and tone of shoulders, elbows, wrist, hip, and ankles bilaterally.  Right knee exam shows the patient does have a replacement.  Minimal swelling noted.  Flexion and 95 degrees. Left knee with significant arthritic changes.  Patient  does have instability noted with stress test and varus and valgus..  Trace effusion noted as well.  Tenderness to palpation diffusely.  Negative squeeze test of the calf.  Limited range of motion of at least 5 degrees in all planes.  Vascular intact distally.     Impression and Recommendations:     This case required medical decision making of moderate complexity. The above documentation has been reviewed and is accurate and complete Lyndal Pulley, DO       Note: This dictation was prepared with Dragon dictation along with smaller phrase technology. Any transcriptional errors that result from this process are unintentional.

## 2018-12-19 ENCOUNTER — Other Ambulatory Visit: Payer: Self-pay

## 2018-12-19 ENCOUNTER — Ambulatory Visit (INDEPENDENT_AMBULATORY_CARE_PROVIDER_SITE_OTHER): Payer: Medicare Other | Admitting: Family Medicine

## 2018-12-19 ENCOUNTER — Encounter: Payer: Self-pay | Admitting: Family Medicine

## 2018-12-19 VITALS — BP 140/70 | HR 79 | Ht 65.0 in | Wt 137.0 lb

## 2018-12-19 DIAGNOSIS — T8484XA Pain due to internal orthopedic prosthetic devices, implants and grafts, initial encounter: Secondary | ICD-10-CM

## 2018-12-19 DIAGNOSIS — Z96651 Presence of right artificial knee joint: Secondary | ICD-10-CM | POA: Diagnosis not present

## 2018-12-19 DIAGNOSIS — Z96659 Presence of unspecified artificial knee joint: Secondary | ICD-10-CM

## 2018-12-19 DIAGNOSIS — M170 Bilateral primary osteoarthritis of knee: Secondary | ICD-10-CM | POA: Diagnosis not present

## 2018-12-19 NOTE — Assessment & Plan Note (Signed)
Continues to have left knee pain.  Discussed with her again about steroid injection, PRP, or the possibility of Visco supplementation again.  Patient feels like she would like to just consider the possibility of replacement.  Encouraged her to discuss with provider again.  Follow-up with me again as needed.  Patient states that she has tramadol for breakthrough pain.

## 2018-12-19 NOTE — Assessment & Plan Note (Signed)
Continues to have pain but is seen another provider.  I do think that with the bone scan being normal-continue to have pain possible arthroscopic could be beneficial but she will talk with a another provider.

## 2018-12-19 NOTE — Patient Instructions (Signed)
Good to see you IB profen 2 pills 2 times a day for a week Take tramadol with tylenol Write me when you have questions

## 2018-12-20 ENCOUNTER — Encounter: Payer: Self-pay | Admitting: Family Medicine

## 2018-12-23 ENCOUNTER — Encounter: Payer: Self-pay | Admitting: Family Medicine

## 2018-12-24 ENCOUNTER — Encounter: Payer: Self-pay | Admitting: Family Medicine

## 2019-01-05 ENCOUNTER — Encounter: Payer: Self-pay | Admitting: Family Medicine

## 2019-01-26 ENCOUNTER — Other Ambulatory Visit: Payer: Self-pay | Admitting: Family Medicine

## 2019-01-26 DIAGNOSIS — Z1231 Encounter for screening mammogram for malignant neoplasm of breast: Secondary | ICD-10-CM

## 2019-02-02 ENCOUNTER — Encounter: Payer: Self-pay | Admitting: Family Medicine

## 2019-02-17 ENCOUNTER — Encounter: Payer: Self-pay | Admitting: Family Medicine

## 2019-03-18 ENCOUNTER — Other Ambulatory Visit: Payer: Self-pay

## 2019-03-18 ENCOUNTER — Ambulatory Visit
Admission: RE | Admit: 2019-03-18 | Discharge: 2019-03-18 | Disposition: A | Discharge status: Home / Self Care | Payer: Medicare PPO | Source: Ambulatory Visit | Attending: Family Medicine | Admitting: Family Medicine

## 2019-03-18 DIAGNOSIS — Z1231 Encounter for screening mammogram for malignant neoplasm of breast: Secondary | ICD-10-CM

## 2019-03-24 ENCOUNTER — Ambulatory Visit: Payer: Medicare Other

## 2019-03-24 ENCOUNTER — Ambulatory Visit: Payer: Medicare PPO | Attending: Internal Medicine

## 2019-03-24 DIAGNOSIS — Z23 Encounter for immunization: Secondary | ICD-10-CM | POA: Insufficient documentation

## 2019-03-24 NOTE — Progress Notes (Signed)
   Covid-19 Vaccination Clinic  Name:  [name]    MRN: [id number] DOB: Oct 25, 1950  03/24/2019  [name] was observed post Covid-19 immunization for 15 minutes without incidence. She was provided with Vaccine Information Sheet and instruction to access the V-Safe system.   [name] was instructed to call 911 with any severe reactions post vaccine: Marland Kitchen Difficulty breathing  . Swelling of your face and throat  . A fast heartbeat  . A bad rash all over your body  . Dizziness and weakness    Immunizations Administered    Name Date Dose VIS Date Route   Pfizer COVID-19 Vaccine 03/24/2019  6:11 PM 0.3 mL 02/13/2019 Intramuscular   Manufacturer: [place]   Lot: [id number]   [place]: [id number]

## 2019-03-30 ENCOUNTER — Ambulatory Visit: Admit: 2019-03-30 | Payer: Medicare Other | Admitting: Orthopedic Surgery

## 2019-03-30 SURGERY — ARTHROPLASTY, KNEE, TOTAL
Anesthesia: Choice | Site: Knee | Laterality: Left

## 2019-04-06 HISTORY — PX: TOTAL KNEE ARTHROPLASTY: SHX125

## 2019-04-12 ENCOUNTER — Ambulatory Visit: Payer: Medicare PPO | Attending: Internal Medicine

## 2019-04-12 DIAGNOSIS — Z23 Encounter for immunization: Secondary | ICD-10-CM

## 2019-04-12 NOTE — Progress Notes (Signed)
   Covid-19 Vaccination Clinic  Name:  [name]    MRN: [id number] DOB: [birth year]  04/12/2019  [name] was observed post Covid-19 immunization for 30 minutes based on pre-vaccination screening without incidence. She was provided with Vaccine Information Sheet and instruction to access the V-Safe system.   [name] was instructed to call 911 with any severe reactions post vaccine: Marland Kitchen Difficulty breathing  . Swelling of your face and throat  . A fast heartbeat  . A bad rash all over your body  . Dizziness and weakness    Immunizations Administered    Name Date Dose VIS Date Route   Pfizer COVID-19 Vaccine 04/12/2019 11:28 AM 0.3 mL 02/13/2019 Intramuscular   Manufacturer: [place]   Lot: [id number]   [place]: [id number]

## 2019-04-25 ENCOUNTER — Ambulatory Visit: Payer: Medicare PPO

## 2019-05-26 ENCOUNTER — Encounter: Payer: Self-pay | Admitting: Certified Nurse Midwife

## 2019-08-12 DIAGNOSIS — M810 Age-related osteoporosis without current pathological fracture: Secondary | ICD-10-CM | POA: Diagnosis not present

## 2019-08-14 ENCOUNTER — Encounter: Payer: Self-pay | Admitting: Nurse Practitioner

## 2019-09-08 DIAGNOSIS — L649 Androgenic alopecia, unspecified: Secondary | ICD-10-CM | POA: Diagnosis not present

## 2019-09-15 ENCOUNTER — Ambulatory Visit: Payer: Medicare Other | Admitting: Certified Nurse Midwife

## 2019-09-17 ENCOUNTER — Ambulatory Visit: Payer: Medicare PPO | Admitting: Nurse Practitioner

## 2019-10-20 DIAGNOSIS — R269 Unspecified abnormalities of gait and mobility: Secondary | ICD-10-CM | POA: Diagnosis not present

## 2019-10-20 DIAGNOSIS — Z96652 Presence of left artificial knee joint: Secondary | ICD-10-CM | POA: Diagnosis not present

## 2019-10-28 ENCOUNTER — Ambulatory Visit: Payer: Medicare PPO | Admitting: Nurse Practitioner

## 2019-11-06 DIAGNOSIS — Z23 Encounter for immunization: Secondary | ICD-10-CM | POA: Diagnosis not present

## 2019-11-06 DIAGNOSIS — M810 Age-related osteoporosis without current pathological fracture: Secondary | ICD-10-CM | POA: Diagnosis not present

## 2019-11-06 DIAGNOSIS — H6120 Impacted cerumen, unspecified ear: Secondary | ICD-10-CM | POA: Diagnosis not present

## 2019-11-12 ENCOUNTER — Encounter: Payer: Self-pay | Admitting: Nurse Practitioner

## 2019-11-12 ENCOUNTER — Ambulatory Visit: Payer: Medicare PPO | Admitting: Nurse Practitioner

## 2019-11-12 VITALS — BP 130/74 | HR 72 | Ht 64.75 in | Wt 133.2 lb

## 2019-11-12 DIAGNOSIS — R197 Diarrhea, unspecified: Secondary | ICD-10-CM

## 2019-11-12 DIAGNOSIS — K5900 Constipation, unspecified: Secondary | ICD-10-CM | POA: Diagnosis not present

## 2019-11-12 NOTE — Patient Instructions (Addendum)
If you are age 71 or older, your body mass index should be between 23-30. Your Body mass index is 22.35 kg/m. If this is out of the aforementioned range listed, please consider follow up with your Primary Care Provider.  If you are age 20 or younger, your body mass index should be between 19-25. Your Body mass index is 22.35 kg/m. If this is out of the aformentioned range listed, please consider follow up with your Primary Care Provider.   Take 1 tablespoon of benefiber daily. Call our office if your symptoms return [phone].  Follow up as needed.  Due to recent changes in healthcare laws, you may see the results of your imaging and laboratory studies on MyChart before your provider has had a chance to review them.  We understand that in some cases there may be results that are confusing or concerning to you. Not all laboratory results come back in the same time frame and the provider may be waiting for multiple results in order to interpret others.  Please give [place] 48 hours in order for your provider to thoroughly review all the results before contacting the office for clarification of your results.   Thank you for choosing [facility] Noralyn Pick, CRNP

## 2019-11-12 NOTE — Progress Notes (Addendum)
11/12/2019 [name] [id number] 09/09/1950   CHIEF COMPLAINT: alternating constipation and diarrhea   HISTORY OF PRESENT ILLNESS:  [name] is a 69 year old female with a past medical history of breast cancer s/p right lumpectomy,chemo and radiation 1999, hypothyroidism. S/P right total knee replacement surgery 04/2017 and left total knee replacement 04/2019.  She presents today for further evaluation regarding alternating diarrhea and constipation which occurred following her left knee total replacement surgery 04/2019.  She initially took Oxycodone and then Tramadol postoperatively which resulted in constipation.  She took a stool softener, MiraLAX and a laxative suppository which resulted in diarrhea.  She then developed alternating constipation and diarrhea.  Her bottom often felt raw.  She viewed her perianal area with a hand-held mirror and saw several cracks in the skin around the anus.  She used a diaper rash cream, Preparation H and hemorrhoidal wipes and her perianal irritation abated.  She was recently seen by her PCP and she mentioned passing nonbloody mucus mixed with small bits of feces on 3-4 occasions over the past month or so and she was advised to schedule an appointment in our office for further GI evaluation.  Currently, she feels well.  Her alternating bowel pattern has improved. For the past week or so she has been passing normal soft brown stool daily.  She infrequently sees a small spot of red blood on the toilet tissue which typically occurred if straining/constipated.  She is taking MiraLAX 1 teaspoon daily.  She is eating yogurt daily.  No further diarrhea.  She lost 8 pounds following her left knee replacement surgery. No further weight loss since that time.  She intends to keep the lost weight off.  She underwent a colonoscopy by Dr. Penelope Coop with [facility] 09/24/2017 which was normal.  No further screening colonoscopies were recommended.  No family history of colorectal  cancer.   Past Medical History:  Diagnosis Date  . Anal fissure   . Breast cancer, right breast ([place]) 11/1997   S/P lumphectomy, chemo,  and radiation therapy  . Broken arm 6/16   right  . Hypercholesteremia   . Hypothyroidism   . Osteoarthritis    "knees; maybe in my lower back" (04/23/2017)  . Personal history of chemotherapy   . Personal history of radiation therapy   . Thyroid disease    hypothyroid   Past Surgical History:  Procedure Laterality Date  . BLADDER SUSPENSION  02/2002   sparc  . BREAST BIOPSY Right 1999  . BREAST LUMPECTOMY Right 11/1997  . MOUTH SURGERY     "dental implants"  . NASAL SINUS SURGERY  12/2015  . TOTAL KNEE ARTHROPLASTY Right 04/22/2017   Procedure: TOTAL KNEE ARTHROPLASTY;  Surgeon: Frederik Pear, MD;  Location: [facility];  Service: Orthopedics;  Laterality: Right;  . TOTAL KNEE ARTHROPLASTY Left 04/2019  . TUBAL LIGATION    . VAGINAL HYSTERECTOMY  02/2002   LAVH-BSO    Social History: She is married.  She has 1 daughter and 1 son.  She works part-time at [employer] She is a nonsmoker. She drinks 2 glasses of wine every evening. No drug use.   Family History: Father died plan crash age 19. Mother rheumatoid arthritis leukemia age 31. Sister with lymphoma. Brother age 69 living and well    Allergies  Allergen Reactions  . Gabapentin Other (See Comments)    Gave pt migraine     Outpatient Encounter Medications as of 11/12/2019  Medication Sig  .  ALPRAZolam (XANAX) 0.5 MG tablet Take 0.25-0.5 mg by mouth 2 (two) times daily as needed for anxiety.  . B Complex Vitamins (VITAMIN B COMPLEX PO) Take 1 tablet by mouth daily.  . Biotin 10000 MCG TABS Take 1 tablet by mouth daily.   Marland Kitchen CALCIUM PO Take by mouth.  . clobetasol ointment (TEMOVATE) 8.33 % Apply 1 application topically as needed.  . Coenzyme Q10 (COQ10) 400 MG CAPS Take 1 capsule by mouth daily.  Marland Kitchen denosumab (PROLIA) 60 MG/ML SOLN injection Inject 60 mg into the skin every 6 (six)  months. Administer in upper arm, thigh, or abdomen  . levothyroxine (SYNTHROID, LEVOTHROID) 75 MCG tablet Take 75 mcg by mouth daily before breakfast.   . minoxidil (LONITEN) 2.5 MG tablet   . Omega-3 Fatty Acids (FISH OIL) 1200 MG CAPS Take 1 capsule by mouth daily.   . simvastatin (ZOCOR) 20 MG tablet Take 20 mg by mouth every evening.  . triamcinolone (KENALOG) 0.1 % paste   . VITAMIN D PO Take by mouth.  . gabapentin (NEURONTIN) 100 MG capsule Take 1-3 capsules by mouth daily. (Patient not taking: Reported on 11/12/2019)  . [DISCONTINUED] finasteride (PROSCAR) 5 MG tablet 1 TABLET BY MOUTH DAILY TAKE 1 TABLET BY MOUTH DAILY  . [DISCONTINUED] Multiple Vitamins-Minerals (MULTIVITAMIN PO) Take 1 tablet by mouth daily.   . [DISCONTINUED] temazepam (RESTORIL) 15 MG capsule Take 15 mg by mouth at bedtime as needed for sleep.   . [DISCONTINUED] traMADol (ULTRAM) 50 MG tablet    No facility-administered encounter medications on file as of 11/12/2019.     REVIEW OF SYSTEMS:  Gen: + 8lb weight loss. Denies fever, sweats or chills.  CV: Denies chest pain, palpitations or edema. Resp: Denies cough, shortness of breath of hemoptysis.  GI: See HPI. GU : Denies urinary burning, blood in urine, increased urinary frequency or incontinence. MS: Denies joint pain, muscles aches or weakness. Derm: Denies rash, itchiness, skin lesions or unhealing ulcers. Psych: Denies depression, anxiety or memory loss. Heme: Denies bruising, bleeding. Neuro:  Denies headaches, dizziness or paresthesias. Endo:  Denies any problems with DM, thyroid or adrenal function.    PHYSICAL EXAM: BP 130/74 (BP Location: Left Arm, Patient Position: Sitting, Cuff Size: Normal)   Pulse 72   Ht 5' 4.75" (1.645 m) Comment: height measured without shoes  Wt 133 lb 4 oz (60.4 kg)   LMP 03/05/1997   BMI 22.35 kg/m    General: Well developed in no acute distress. Head: Normocephalic and atraumatic. Eyes:  Sclerae non-icteric,  conjunctive pink. Ears: Normal auditory acuity. Mouth: Dentition intact. No ulcers or lesions.  Neck: Supple, no lymphadenopathy or thyromegaly.  Lungs: Clear bilaterally to auscultation without wheezes, crackles or rhonchi. Heart: Regular rate and rhythm. No murmur, rub or gallop appreciated.  Abdomen: Soft, nontender, non distended. No masses. No hepatosplenomegaly. Normoactive bowel sounds x 4 quadrants.  Rectal: Patient declined rectal exam as her anorectal symptoms have abated.  Musculoskeletal: Symmetrical with no gross deformities. Skin: Warm and dry. No rash or lesions on visible extremities. Extremities: No edema. Neurological: Alert oriented x 4, no focal deficits.  Psychological:  Alert and cooperative. Normal mood and affect.  ASSESSMENT AND PLAN:  42. 69 year old female with alternating constipation/diarrhea post left knee replacement surgery 04/2019 has resolved. -Benefiber 1 TBS daily -Continue Miralax as needed -Follow up in office as needed   2. Perianal irritation has resolved  3. Infrequent rectal bleeding. Colonoscopy by [facility] 09/2017 was normal.  -  Patient did not wish to pursue a rectal exam today. She will follow up in our office if her rectal bleeding persists or worsens.   ADDENDUM 9/20/2021[facility] records received. Colonoscopy 09/24/2017 by Dr. Anson Fret: Normal colonoscopy. The quality of the bowel prep was good.   CC:  Mayra Neer, MD

## 2019-11-13 NOTE — Progress Notes (Signed)
Agree with assessment and plan as outlined.  

## 2019-11-17 DIAGNOSIS — Z853 Personal history of malignant neoplasm of breast: Secondary | ICD-10-CM | POA: Diagnosis not present

## 2019-11-17 DIAGNOSIS — Z01419 Encounter for gynecological examination (general) (routine) without abnormal findings: Secondary | ICD-10-CM | POA: Diagnosis not present

## 2019-11-20 DIAGNOSIS — Z96652 Presence of left artificial knee joint: Secondary | ICD-10-CM | POA: Diagnosis not present

## 2019-11-20 DIAGNOSIS — Z471 Aftercare following joint replacement surgery: Secondary | ICD-10-CM | POA: Diagnosis not present

## 2019-12-11 DIAGNOSIS — E7800 Pure hypercholesterolemia, unspecified: Secondary | ICD-10-CM | POA: Diagnosis not present

## 2019-12-11 DIAGNOSIS — E039 Hypothyroidism, unspecified: Secondary | ICD-10-CM | POA: Diagnosis not present

## 2019-12-11 DIAGNOSIS — R252 Cramp and spasm: Secondary | ICD-10-CM | POA: Diagnosis not present

## 2019-12-15 ENCOUNTER — Other Ambulatory Visit: Payer: Self-pay | Admitting: Family Medicine

## 2019-12-15 DIAGNOSIS — E7800 Pure hypercholesterolemia, unspecified: Secondary | ICD-10-CM

## 2019-12-16 ENCOUNTER — Other Ambulatory Visit: Payer: Self-pay | Admitting: Family Medicine

## 2019-12-16 DIAGNOSIS — R0989 Other specified symptoms and signs involving the circulatory and respiratory systems: Secondary | ICD-10-CM

## 2019-12-16 DIAGNOSIS — E7800 Pure hypercholesterolemia, unspecified: Secondary | ICD-10-CM

## 2019-12-24 ENCOUNTER — Ambulatory Visit
Admission: RE | Admit: 2019-12-24 | Discharge: 2019-12-24 | Disposition: A | Discharge status: Home / Self Care | Payer: Medicare PPO | Source: Ambulatory Visit | Attending: Family Medicine | Admitting: Family Medicine

## 2019-12-24 ENCOUNTER — Other Ambulatory Visit: Payer: Self-pay

## 2019-12-24 DIAGNOSIS — R0989 Other specified symptoms and signs involving the circulatory and respiratory systems: Secondary | ICD-10-CM

## 2019-12-24 DIAGNOSIS — I6523 Occlusion and stenosis of bilateral carotid arteries: Secondary | ICD-10-CM | POA: Diagnosis not present

## 2019-12-24 DIAGNOSIS — E041 Nontoxic single thyroid nodule: Secondary | ICD-10-CM | POA: Diagnosis not present

## 2019-12-24 DIAGNOSIS — E7800 Pure hypercholesterolemia, unspecified: Secondary | ICD-10-CM

## 2019-12-29 DIAGNOSIS — M85851 Other specified disorders of bone density and structure, right thigh: Secondary | ICD-10-CM | POA: Diagnosis not present

## 2019-12-29 DIAGNOSIS — M85852 Other specified disorders of bone density and structure, left thigh: Secondary | ICD-10-CM | POA: Diagnosis not present

## 2019-12-30 ENCOUNTER — Other Ambulatory Visit: Payer: Medicare PPO

## 2019-12-31 ENCOUNTER — Ambulatory Visit
Admission: RE | Admit: 2019-12-31 | Discharge: 2019-12-31 | Disposition: A | Discharge status: Home / Self Care | Payer: Medicare PPO | Source: Ambulatory Visit | Attending: Family Medicine | Admitting: Family Medicine

## 2019-12-31 DIAGNOSIS — I2510 Atherosclerotic heart disease of native coronary artery without angina pectoris: Secondary | ICD-10-CM | POA: Diagnosis not present

## 2019-12-31 DIAGNOSIS — E7800 Pure hypercholesterolemia, unspecified: Secondary | ICD-10-CM

## 2020-01-04 DIAGNOSIS — H5203 Hypermetropia, bilateral: Secondary | ICD-10-CM | POA: Diagnosis not present

## 2020-01-04 DIAGNOSIS — H2513 Age-related nuclear cataract, bilateral: Secondary | ICD-10-CM | POA: Diagnosis not present

## 2020-01-05 ENCOUNTER — Other Ambulatory Visit: Payer: Self-pay | Admitting: Family Medicine

## 2020-01-05 DIAGNOSIS — E041 Nontoxic single thyroid nodule: Secondary | ICD-10-CM

## 2020-01-13 ENCOUNTER — Other Ambulatory Visit: Payer: Medicare PPO

## 2020-01-20 ENCOUNTER — Ambulatory Visit (INDEPENDENT_AMBULATORY_CARE_PROVIDER_SITE_OTHER): Payer: Medicare PPO

## 2020-01-20 ENCOUNTER — Ambulatory Visit: Payer: Self-pay

## 2020-01-20 ENCOUNTER — Other Ambulatory Visit: Payer: Self-pay

## 2020-01-20 ENCOUNTER — Ambulatory Visit: Payer: Medicare PPO | Admitting: Family Medicine

## 2020-01-20 ENCOUNTER — Ambulatory Visit
Admission: RE | Admit: 2020-01-20 | Discharge: 2020-01-20 | Disposition: A | Discharge status: Home / Self Care | Payer: Medicare PPO | Source: Ambulatory Visit | Attending: Family Medicine | Admitting: Family Medicine

## 2020-01-20 ENCOUNTER — Encounter: Payer: Self-pay | Admitting: Family Medicine

## 2020-01-20 VITALS — BP 128/76 | HR 74 | Ht 64.75 in | Wt 134.0 lb

## 2020-01-20 DIAGNOSIS — M79672 Pain in left foot: Secondary | ICD-10-CM

## 2020-01-20 DIAGNOSIS — E041 Nontoxic single thyroid nodule: Secondary | ICD-10-CM

## 2020-01-20 DIAGNOSIS — M19072 Primary osteoarthritis, left ankle and foot: Secondary | ICD-10-CM | POA: Diagnosis not present

## 2020-01-20 MED ORDER — NITROGLYCERIN 0.2 MG/HR TD PT24: MEDICATED_PATCH | TRANSDERMAL | 1 refills | Status: DC

## 2020-01-20 NOTE — Progress Notes (Signed)
I, Wendy Poet, LAT, ATC, am serving as scribe for Dr. Lynne Leader.  [name] is a 69 y.o. female who presents to [facility] today for L heel pain.  She was last seen by Dr. Tamala Julian on 12/19/18 for B knee pain.  Since then, pt reports new L heel pain x 6-8 weeks w/ no known MOI.  She reports intermittent pain until yesterday when the pain got much worse and sharper.  She localizes her pain to her L post calcaneus and reports some radiating pain intermittently into her L Achille's / calf.  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    Radiating pain: yes, intermittently into her L Achille's and L calf Aggravating factors: walking; pressure to the painful area Treatments tried: Tylenol   Pertinent review of systems: No fevers or chills  Relevant historical information: History of bilateral total knee replacement.  Works the garden center of [employer] History of breast cancer  Exam:  BP 128/76 (BP Location: Left Arm, Patient Position: Sitting, Cuff Size: Normal)   Pulse 74   Ht 5' 4.75" (1.645 m)   Wt 134 lb (60.8 kg)   LMP 03/05/1997   SpO2 98%   BMI 22.47 kg/m  General: Well Developed, well nourished, and in no acute distress.   MSK: Left foot normal-appearing Tender palpation posterior aspect of calcaneus distal to insertion site of Achilles tendon. Normal foot and ankle motion. Stable ligamentous exam. Intact strength.    Lab and Radiology Results  X-ray images left os calcis obtained today personally and independently interpreted Tiny plantar calcaneal osteophyte.  Otherwise normal-appearing Await formal radiology review  Diagnostic Limited MSK Ultrasound of: Left Achilles tendon Achilles tendon normal-appearing distal portion of tendon insertion tiny area of hyperechoic change consistent with calcific tendinopathy present.  No significant increased Doppler activity or change in  the bony cortex. Plantar fascia normal-appearing Impression: Insertional Achilles tendinopathy     Assessment and Plan: 69 y.o. female with posterior calcaneus pain ongoing for 6 to 8 weeks.  No clear injury pattern.  Discussed options.  Plan for conservative management with eccentric exercises, nitroglycerin patch protocol, and possible heel lift.  Recheck back in 1 to 2 months sooner if needed.   PDMP not reviewed this encounter. Orders Placed This Encounter  Procedures  . [place] LIMITED JOINT SPACE STRUCTURES LOW LEFT(NO LINKED CHARGES)    Order Specific Question:   Reason for Exam (SYMPTOM  OR DIAGNOSIS REQUIRED)    Answer:   L heel pain    Order Specific Question:   Preferred imaging location?    Answer:   [facility]  . DG Os Calcis Left    Standing Status:   Future    Number of Occurrences:   1    Standing Expiration Date:   01/19/2021    Order Specific Question:   Reason for Exam (SYMPTOM  OR DIAGNOSIS REQUIRED)    Answer:   eval heel pain    Order Specific Question:   Preferred imaging location?    Answer:   [facility]   Meds ordered this encounter  Medications  . nitroGLYCERIN (NITRODUR - DOSED IN MG/24 HR) 0.2 mg/hr patch    Sig: Apply 1/4 patch daily to tendon for tendonitis.    Dispense:  30 patch    Refill:  1     Discussed warning signs or  symptoms. Please see discharge instructions. Patient expresses understanding.   The above documentation has been reviewed and is accurate and complete Lynne Leader, M.D.

## 2020-01-20 NOTE — Patient Instructions (Signed)
Thank you for coming in today.  Use heel lift or gel heel cup.   Do the eccentric exercises.  30 reps 2-3x daily.   Nitroglycerin Protocol   Apply 1/4 nitroglycerin patch to affected area daily.  Change position of patch within the affected area every 24 hours.  You may experience a headache during the first 1-2 weeks of using the patch, these should subside.  If you experience headaches after beginning nitroglycerin patch treatment, you may take your preferred over the counter pain reliever.  Another side effect of the nitroglycerin patch is skin irritation or rash related to patch adhesive.  Please notify our office if you develop more severe headaches or rash, and stop the patch.  Tendon healing with nitroglycerin patch may require 12 to 24 weeks depending on the extent of injury.  Men should not use if taking Viagra, Cialis, or Levitra.   Do not use if you have migraines or rosacea.   Recheck in 1-2 months if not improving.

## 2020-01-22 NOTE — Progress Notes (Signed)
X-ray left heel shows a tiny heel spur and some arthritis at the ankle joint articulation with the heel bone.  Neither of these are in the location where you have pain.  Therefore source of pain is likely to be soft tissue such as Achilles tendinitis.

## 2020-01-27 ENCOUNTER — Ambulatory Visit: Payer: Medicare PPO | Admitting: Family Medicine

## 2020-02-12 DIAGNOSIS — Z8739 Personal history of other diseases of the musculoskeletal system and connective tissue: Secondary | ICD-10-CM | POA: Diagnosis not present

## 2020-03-17 ENCOUNTER — Encounter: Payer: Self-pay | Admitting: Family Medicine

## 2020-03-17 ENCOUNTER — Ambulatory Visit: Payer: Medicare PPO | Admitting: Family Medicine

## 2020-03-17 ENCOUNTER — Other Ambulatory Visit: Payer: Self-pay

## 2020-03-17 ENCOUNTER — Telehealth: Payer: Self-pay | Admitting: Family Medicine

## 2020-03-17 ENCOUNTER — Ambulatory Visit: Payer: Self-pay

## 2020-03-17 VITALS — BP 142/100 | HR 82 | Ht 64.0 in | Wt 135.0 lb

## 2020-03-17 DIAGNOSIS — M25572 Pain in left ankle and joints of left foot: Secondary | ICD-10-CM

## 2020-03-17 DIAGNOSIS — G8929 Other chronic pain: Secondary | ICD-10-CM

## 2020-03-17 DIAGNOSIS — M79672 Pain in left foot: Secondary | ICD-10-CM | POA: Diagnosis not present

## 2020-03-17 NOTE — Progress Notes (Signed)
[place] Phone: [phone] Subjective:   [name], am serving as a scribe for Dr. Hulan Saas. This visit occurred during the SARS-CoV-2 public health emergency.  Safety protocols were in place, including screening questions prior to the visit, additional usage of staff PPE, and extensive cleaning of exam room while observing appropriate contact time as indicated for disinfecting solutions.   I'm seeing this patient by the request  of:  Tara Neer, MD  CC: foot pain   [id number]  [name] is a 70 y.o. female coming in with complaint of left heel pain for about 4 months. Saw Dr. Georgina Snell on 01/20/2020. Given nitro, heel cups and eccentric exercises. Patient states that she has been using nitro patches. PT friend cut a hole in heel lift which finally helped heel pain. Arch is now hurting on left foot as well as pain in great toe and second toe. Patient is not on her feet more. Has not changed shoes recently.  Patient states that if anything very mild improvement overall      Past Medical History:  Diagnosis Date  . Anal fissure   . Breast cancer, right breast ([place]) 11/1997   S/P lumphectomy, chemo,  and radiation therapy  . Broken arm 6/16   right  . Hypercholesteremia   . Hypothyroidism   . Osteoarthritis    "knees; maybe in my lower back" (04/23/2017)  . Personal history of chemotherapy   . Personal history of radiation therapy   . Thyroid disease    hypothyroid   Past Surgical History:  Procedure Laterality Date  . BLADDER SUSPENSION  02/2002   sparc  . BREAST BIOPSY Right 1999  . BREAST LUMPECTOMY Right 11/1997  . MOUTH SURGERY     "dental implants"  . NASAL SINUS SURGERY  12/2015  . TOTAL KNEE ARTHROPLASTY Right 04/22/2017   Procedure: TOTAL KNEE ARTHROPLASTY;  Surgeon: Frederik Pear, MD;  Location: [facility];  Service: Orthopedics;  Laterality: Right;  . TOTAL KNEE ARTHROPLASTY Left  04/2019  . TUBAL LIGATION    . VAGINAL HYSTERECTOMY  02/2002   LAVH-BSO   Social History   Socioeconomic History  . Marital status: Married    Spouse name: Not on file  . Number of children: 2  . Years of education: Not on file  . Highest education level: Not on file  Occupational History  . Occupation: retired  . Occupation: pt time at [employer] Use  . Smoking status: Never Smoker  . Smokeless tobacco: Never Used  Vaping Use  . Vaping Use: Never used  Substance and Sexual Activity  . Alcohol use: Yes    Alcohol/week: 6.0 - 8.0 standard drinks    Types: 6 - 8 Standard drinks or equivalent per week    Comment: 2 + per day  . Drug use: No  . Sexual activity: Not Currently    Partners: Male    Birth control/protection: Surgical    Comment: LAVH  Other Topics Concern  . Not on file  Social History Narrative  . Not on file   Social Determinants of Health   Financial Resource Strain: Not on file  Food Insecurity: Not on file  Transportation Needs: Not on file  Physical Activity: Not on file  Stress: Not on file  Social Connections: Not on file   Allergies  Allergen Reactions  . Gabapentin Other (See Comments)    Gave pt migraine  Family History  Problem Relation Age of Onset  . Rheum arthritis Mother 74  . Leukemia Mother   . Non-Hodgkin's lymphoma Sister   . Other Father 93       accident    Current Outpatient Medications (Endocrine & Metabolic):  .  denosumab (PROLIA) 60 MG/ML SOLN injection, Inject 60 mg into the skin every 6 (six) months. Administer in upper arm, thigh, or abdomen .  levothyroxine (SYNTHROID, LEVOTHROID) 75 MCG tablet, Take 75 mcg by mouth daily before breakfast.   Current Outpatient Medications (Cardiovascular):  .  minoxidil (LONITEN) 2.5 MG tablet,  .  nitroGLYCERIN (NITRODUR - DOSED IN MG/24 HR) 0.2 mg/hr patch, Apply 1/4 patch daily to tendon for tendonitis. Marland Kitchen  simvastatin (ZOCOR) 5 MG tablet, Take 5 mg by mouth  daily.     Current Outpatient Medications (Other):  Marland Kitchen  ALPRAZolam (XANAX) 0.5 MG tablet, Take 0.25-0.5 mg by mouth 2 (two) times daily as needed for anxiety. .  Biotin 10000 MCG TABS, Take 1 tablet by mouth daily.  Marland Kitchen  CALCIUM PO, Take by mouth. .  clobetasol ointment (TEMOVATE) 9.67 %, Apply 1 application topically as needed. .  Coenzyme Q10 (COQ10) 400 MG CAPS, Take 1 capsule by mouth daily. .  Omega-3 Fatty Acids (FISH OIL) 1200 MG CAPS, Take 1 capsule by mouth daily.  Marland Kitchen  triamcinolone (KENALOG) 0.1 % paste,  .  VITAMIN D PO, Take by mouth.   Reviewed prior external information including notes and imaging from  primary care provider As well as notes that were available from care everywhere and other healthcare systems.  Past medical history, social, surgical and family history all reviewed in electronic medical record.  No pertanent information unless stated regarding to the chief complaint.   Review of Systems:  No headache, visual changes, nausea, vomiting, diarrhea, constipation, dizziness, abdominal pain, skin rash, fevers, chills, night sweats, weight loss, swollen lymph nodes, body aches, joint swelling, chest pain, shortness of breath, mood changes. POSITIVE muscle aches  Objective  Blood pressure (!) 142/100, pulse 82, height 5\' 4"  (1.626 m), weight 135 lb (61.2 kg), last menstrual period 03/05/1997, SpO2 99 %.   General: No apparent distress alert and oriented x3 mood and affect normal, dressed appropriately.  HEENT: Pupils equal, extraocular movements intact  Respiratory: Patient's speak in full sentences and does not appear short of breath  Cardiovascular: No lower extremity edema, non tender, no erythema  Antalgic gait noted. Left foot exam shows that patient does have a mild narrow foot.  Rigid midfoot noted.  Breakdown of the transverse arch with bunion and bunionette formation noted.  Severely tender to palpation on the plantar aspect of the calcaneal region.   Patient does have a small likely cystic area noted under the skin.  This is tender to palpation.  Very tender over the calcaneal heel.  Achilles appears intact.  Negative Tinel over the medial aspect of the ankle  Limited musculoskeletal ultrasound was performed and interpreted by Lyndal Pulley  Limited ultrasound of patient's foot shows on the plantar aspect significant hypoechoic changes right over the calcaneal bone.  Questionable increase in 6 Doppler flow that could be consistent with either a plantar fascial tear or abnormal vascularity.  Possible cortical irregularity noted of the underlying calcaneal bone noted there. Impression: Abnormal ultrasound of the heel, questionable cyst versus plantar fascial tear but difficult to assess completely.  Questionable cortical irregularity of the calcaneal bone    Impression and Recommendations:  The above documentation has been reviewed and is accurate and complete Lyndal Pulley, DO

## 2020-03-17 NOTE — Telephone Encounter (Signed)
Patient questioning why the ankle needs to be included in the MRI order.

## 2020-03-17 NOTE — Patient Instructions (Signed)
MRI left foot and ankle After MRI if conservative therapy is ok we will refer to [facility], wear Hoka Bondhi 7, and OOFOS recovery sandals in the house If something different we will figure out at that time

## 2020-03-17 NOTE — Assessment & Plan Note (Signed)
Patient has been dealing with this now for multiple months.  Going on for months.  States minimal improvement overall.  Patient on ultrasound today does have hypoechoic changes with significant increase in Doppler flow to the area.  Questionable cyst formation versus possible even a small cortical irregularity noted.  Patient does not feel like she has made significant improvement and would like an MRI first.  We will get an MRI with her failing conservative therapy for the last 4 months and then further evaluate.  If more of a cyst formation can consider injections, formal physical therapy, custom orthotics.  Patient is in agreement with the plan and will follow-up after imaging

## 2020-03-17 NOTE — Telephone Encounter (Signed)
Pt returned call, read response below. [name] is still seemed confused but was unable to explain where the confusion was.  [name] is happy to do whatever MRI Dr. Tamala Julian recommends

## 2020-03-17 NOTE — Telephone Encounter (Signed)
Left message for patient in regards to MRI orders. Radiology recommends ankle to include calcaneous but Dr. Tamala Julian recommends the foot MRI in order to evaluate the remainder of symptoms that are mid-foot and toes. Patient can decide which orders she would like to proceed with.

## 2020-03-21 NOTE — Telephone Encounter (Signed)
Sent patient MyChart message that Dr. Tamala Julian recommends both the foot and ankle MRI. It appears patient is already scheduled for both on 04/02/2020.

## 2020-04-02 ENCOUNTER — Other Ambulatory Visit: Payer: Medicare PPO

## 2020-04-05 ENCOUNTER — Encounter: Payer: Self-pay | Admitting: Family Medicine

## 2020-04-09 ENCOUNTER — Ambulatory Visit
Admission: RE | Admit: 2020-04-09 | Discharge: 2020-04-09 | Disposition: A | Discharge status: Home / Self Care | Payer: Medicare PPO | Source: Ambulatory Visit | Attending: Family Medicine | Admitting: Family Medicine

## 2020-04-09 ENCOUNTER — Other Ambulatory Visit: Payer: Self-pay

## 2020-04-09 DIAGNOSIS — G8929 Other chronic pain: Secondary | ICD-10-CM

## 2020-04-09 DIAGNOSIS — M19072 Primary osteoarthritis, left ankle and foot: Secondary | ICD-10-CM | POA: Diagnosis not present

## 2020-04-09 DIAGNOSIS — M722 Plantar fascial fibromatosis: Secondary | ICD-10-CM | POA: Diagnosis not present

## 2020-04-09 DIAGNOSIS — M79672 Pain in left foot: Secondary | ICD-10-CM

## 2020-04-09 DIAGNOSIS — R600 Localized edema: Secondary | ICD-10-CM | POA: Diagnosis not present

## 2020-04-13 ENCOUNTER — Telehealth: Payer: Self-pay | Admitting: Family Medicine

## 2020-04-13 ENCOUNTER — Other Ambulatory Visit: Payer: Self-pay

## 2020-04-13 ENCOUNTER — Encounter: Payer: Self-pay | Admitting: Family Medicine

## 2020-04-13 MED ORDER — AMBULATORY NON FORMULARY MEDICATION: 0 refills | Status: DC

## 2020-04-13 NOTE — Telephone Encounter (Signed)
Pt would like an RX written for a walking boot for her L foot. She is looking at purchasing at [facility] and thinks her DME benefits would help.  Please call when ready and she will pickup.

## 2020-04-13 NOTE — Telephone Encounter (Signed)
At front desk. Mychart message sent.

## 2020-04-13 NOTE — Telephone Encounter (Signed)
In order for insurance to cover, this written RX needs a diagnosis code on it. Could you re-print with diagnosis code and she will pick up again.

## 2020-04-13 NOTE — Telephone Encounter (Signed)
Pt picked up rx

## 2020-04-13 NOTE — Telephone Encounter (Signed)
Prescription is at front desk. Patient called and there was no answer. Sending a [employer] as well.

## 2020-04-14 ENCOUNTER — Encounter: Payer: Self-pay | Admitting: Family Medicine

## 2020-04-14 ENCOUNTER — Other Ambulatory Visit: Payer: Self-pay

## 2020-04-14 DIAGNOSIS — M25579 Pain in unspecified ankle and joints of unspecified foot: Secondary | ICD-10-CM

## 2020-04-14 NOTE — Telephone Encounter (Signed)
Patient called asking if a referral for PT could be sent to [facility] regarding this issue.

## 2020-04-22 DIAGNOSIS — M25572 Pain in left ankle and joints of left foot: Secondary | ICD-10-CM | POA: Diagnosis not present

## 2020-04-22 DIAGNOSIS — Z96652 Presence of left artificial knee joint: Secondary | ICD-10-CM | POA: Diagnosis not present

## 2020-04-22 DIAGNOSIS — R269 Unspecified abnormalities of gait and mobility: Secondary | ICD-10-CM | POA: Diagnosis not present

## 2020-04-29 DIAGNOSIS — Z96652 Presence of left artificial knee joint: Secondary | ICD-10-CM | POA: Diagnosis not present

## 2020-04-29 DIAGNOSIS — R269 Unspecified abnormalities of gait and mobility: Secondary | ICD-10-CM | POA: Diagnosis not present

## 2020-04-29 DIAGNOSIS — M25572 Pain in left ankle and joints of left foot: Secondary | ICD-10-CM | POA: Diagnosis not present

## 2020-05-02 DIAGNOSIS — R269 Unspecified abnormalities of gait and mobility: Secondary | ICD-10-CM | POA: Diagnosis not present

## 2020-05-02 DIAGNOSIS — Z96652 Presence of left artificial knee joint: Secondary | ICD-10-CM | POA: Diagnosis not present

## 2020-05-02 DIAGNOSIS — M25572 Pain in left ankle and joints of left foot: Secondary | ICD-10-CM | POA: Diagnosis not present

## 2020-05-05 DIAGNOSIS — Z96652 Presence of left artificial knee joint: Secondary | ICD-10-CM | POA: Diagnosis not present

## 2020-05-05 DIAGNOSIS — M25572 Pain in left ankle and joints of left foot: Secondary | ICD-10-CM | POA: Diagnosis not present

## 2020-05-05 DIAGNOSIS — R269 Unspecified abnormalities of gait and mobility: Secondary | ICD-10-CM | POA: Diagnosis not present

## 2020-05-16 DIAGNOSIS — R269 Unspecified abnormalities of gait and mobility: Secondary | ICD-10-CM | POA: Diagnosis not present

## 2020-05-16 DIAGNOSIS — M25572 Pain in left ankle and joints of left foot: Secondary | ICD-10-CM | POA: Diagnosis not present

## 2020-05-16 DIAGNOSIS — Z96652 Presence of left artificial knee joint: Secondary | ICD-10-CM | POA: Diagnosis not present

## 2020-05-20 DIAGNOSIS — Z96652 Presence of left artificial knee joint: Secondary | ICD-10-CM | POA: Diagnosis not present

## 2020-05-20 DIAGNOSIS — M25572 Pain in left ankle and joints of left foot: Secondary | ICD-10-CM | POA: Diagnosis not present

## 2020-05-20 DIAGNOSIS — R269 Unspecified abnormalities of gait and mobility: Secondary | ICD-10-CM | POA: Diagnosis not present

## 2020-05-25 ENCOUNTER — Other Ambulatory Visit: Payer: Self-pay

## 2020-05-25 ENCOUNTER — Ambulatory Visit: Payer: Medicare PPO | Admitting: Family Medicine

## 2020-05-25 VITALS — BP 132/90 | Ht 65.0 in | Wt 135.0 lb

## 2020-05-25 DIAGNOSIS — M222X2 Patellofemoral disorders, left knee: Secondary | ICD-10-CM | POA: Diagnosis not present

## 2020-05-25 DIAGNOSIS — M222X1 Patellofemoral disorders, right knee: Secondary | ICD-10-CM | POA: Diagnosis not present

## 2020-05-25 DIAGNOSIS — M79672 Pain in left foot: Secondary | ICD-10-CM | POA: Diagnosis not present

## 2020-05-25 NOTE — Patient Instructions (Signed)
You have plantar fasciitis Take tylenol and/or aleve as needed for pain  Plantar fascia stretch for 20-30 seconds (do 3 of these) in morning Lowering/raise on a step exercises 3 x 10 once or twice a day - this is very important for long term recovery. Can add heel walks, toe walks forward and backward as well Ice heel for 15 minutes as needed. Avoid flat shoes/barefoot walking as much as possible. Arch straps have been shown to help with pain. Inserts are important (spencos, our green insoles, custom orthotics).  You have patellofemoral syndrome of your knees. Avoid painful activities when possible (often stairs, inclines, squats bother this). Cross train with swimming, cycling with low resistance, elliptical if needed. Do home exercises as directed to strengthen inside quad and hip abductor muscles. Add ankle weight if these become too easy. Discuss with physical therapy also - we can add formal referral for them if they need it. Correct foot breakdown with something like spencos or our green sports insoles. Avoid flat shoes, barefoot walking as much as possible. Icing 15 minutes at a time 3-4 times a day as needed. Follow up with me in 6 weeks.

## 2020-05-26 ENCOUNTER — Encounter: Payer: Self-pay | Admitting: Family Medicine

## 2020-05-26 DIAGNOSIS — M25572 Pain in left ankle and joints of left foot: Secondary | ICD-10-CM | POA: Diagnosis not present

## 2020-05-26 DIAGNOSIS — R269 Unspecified abnormalities of gait and mobility: Secondary | ICD-10-CM | POA: Diagnosis not present

## 2020-05-26 DIAGNOSIS — Z96652 Presence of left artificial knee joint: Secondary | ICD-10-CM | POA: Diagnosis not present

## 2020-05-26 NOTE — Progress Notes (Signed)
PCP: Mayra Neer, MD  Subjective:   HPI: Patient is a 70 y.o. female here for left foot, bilateral knee pain.  Patient reports she's had plantar left heel pain since September. She had started with heel cups, nitro patches, eccentric exercise program though exercises were too painful so held off on these. Was not improving so had MRI of foot and ankle which showed severe plantar fasciitis as well as stress reactions 2nd metatarsal and middle cuneiform. She has been wearing a boot up until about a week ago (wore for total of a month). Pain dorsal left foot has improved but heel pain persists. Now wearing Hokas when she can (very recent) though when on feet at work she's wearing merrells. Also having bilateral anterior knee pain worse with stairs - is s/p bilateral knee replacements.  Past Medical History:  Diagnosis Date  . Anal fissure   . Breast cancer, right breast ([place]) 11/1997   S/P lumphectomy, chemo,  and radiation therapy  . Broken arm 6/16   right  . Hypercholesteremia   . Hypothyroidism   . Osteoarthritis    "knees; maybe in my lower back" (04/23/2017)  . Personal history of chemotherapy   . Personal history of radiation therapy   . Thyroid disease    hypothyroid    Current Outpatient Medications on File Prior to Visit  Medication Sig Dispense Refill  . ALPRAZolam (XANAX) 0.5 MG tablet Take 0.25-0.5 mg by mouth 2 (two) times daily as needed for anxiety.    . AMBULATORY NON FORMULARY MEDICATION Walking boot for left foot 1 Units 0  . AMBULATORY NON FORMULARY MEDICATION Walking boot left foot. M79.672, M25.572 1 Units 0  . Biotin 10000 MCG TABS Take 1 tablet by mouth daily.     Marland Kitchen CALCIUM PO Take by mouth.    . clobetasol ointment (TEMOVATE) 4.85 % Apply 1 application topically as needed.    . Coenzyme Q10 (COQ10) 400 MG CAPS Take 1 capsule by mouth daily.    Marland Kitchen denosumab (PROLIA) 60 MG/ML SOLN injection Inject 60 mg into the skin every 6 (six) months. Administer in  upper arm, thigh, or abdomen    . levothyroxine (SYNTHROID, LEVOTHROID) 75 MCG tablet Take 75 mcg by mouth daily before breakfast.     . minoxidil (LONITEN) 2.5 MG tablet     . nitroGLYCERIN (NITRODUR - DOSED IN MG/24 HR) 0.2 mg/hr patch Apply 1/4 patch daily to tendon for tendonitis. 30 patch 1  . Omega-3 Fatty Acids (FISH OIL) 1200 MG CAPS Take 1 capsule by mouth daily.     . simvastatin (ZOCOR) 5 MG tablet Take 5 mg by mouth daily.    Marland Kitchen triamcinolone (KENALOG) 0.1 % paste     . VITAMIN D PO Take by mouth.     No current facility-administered medications on file prior to visit.    Past Surgical History:  Procedure Laterality Date  . BLADDER SUSPENSION  02/2002   sparc  . BREAST BIOPSY Right 1999  . BREAST LUMPECTOMY Right 11/1997  . MOUTH SURGERY     "dental implants"  . NASAL SINUS SURGERY  12/2015  . TOTAL KNEE ARTHROPLASTY Right 04/22/2017   Procedure: TOTAL KNEE ARTHROPLASTY;  Surgeon: Frederik Pear, MD;  Location: [facility];  Service: Orthopedics;  Laterality: Right;  . TOTAL KNEE ARTHROPLASTY Left 04/2019  . TUBAL LIGATION    . VAGINAL HYSTERECTOMY  02/2002   LAVH-BSO    Allergies  Allergen Reactions  . Gabapentin Other (See Comments)  Gave pt migraine    Social History   Socioeconomic History  . Marital status: Married    Spouse name: Not on file  . Number of children: 2  . Years of education: Not on file  . Highest education level: Not on file  Occupational History  . Occupation: retired  . Occupation: pt time at [employer] Use  . Smoking status: Never Smoker  . Smokeless tobacco: Never Used  Vaping Use  . Vaping Use: Never used  Substance and Sexual Activity  . Alcohol use: Yes    Alcohol/week: 6.0 - 8.0 standard drinks    Types: 6 - 8 Standard drinks or equivalent per week    Comment: 2 + per day  . Drug use: No  . Sexual activity: Not Currently    Partners: Male    Birth control/protection: Surgical    Comment: LAVH  Other Topics  Concern  . Not on file  Social History Narrative  . Not on file   Social Determinants of Health   Financial Resource Strain: Not on file  Food Insecurity: Not on file  Transportation Needs: Not on file  Physical Activity: Not on file  Stress: Not on file  Social Connections: Not on file  Intimate Partner Violence: Not on file    Family History  Problem Relation Age of Onset  . Rheum arthritis Mother 51  . Leukemia Mother   . Non-Hodgkin's lymphoma Sister   . Other Father 70       accident    BP 132/90   Ht 5\' 5"  (1.651 m)   Wt 135 lb (61.2 kg)   LMP 03/05/1997   BMI 22.47 kg/m   No flowsheet data found.  No flowsheet data found.  Review of Systems: See HPI above.     Objective:  Physical Exam:  Gen: NAD, comfortable in exam room  Left foot/ankle: No gross deformity, swelling, ecchymoses FROM without reproduction of pain TTP plantar posterior heel, less medial calcaneus at PF insertion negaitve ant drawer and negative talar tilt.   Negative syndesmotic compression. Thompsons test negative. Negative calcaneal squeeze. NV intact distally.  Bilateral knees: No gross deformity, ecchymoses, swelling.  Well healed anterior TKR scars.  Mild effusions. No TTP patellar tendon, quad tendon, post patellar facets. FROM with normal strength. Negative valgus/varus testing. NV intact distally.   Assessment & Plan:  1. Left foot pain - stress reactions healed.  Pain 2/2 plantar fasciitis.  Encouraged her to do the eccentrics, build up to these if she has to.  Arch binders, arch supports.  Tylenol and/or aleve if needed.  Continue with physical therapy.  2. Patellofemoral syndrome - add physical therapy for this as well focusing on VMO, hip abductors.  Arch supports will help with this as well.

## 2020-06-08 DIAGNOSIS — M25572 Pain in left ankle and joints of left foot: Secondary | ICD-10-CM | POA: Diagnosis not present

## 2020-06-08 DIAGNOSIS — Z96652 Presence of left artificial knee joint: Secondary | ICD-10-CM | POA: Diagnosis not present

## 2020-06-08 DIAGNOSIS — R269 Unspecified abnormalities of gait and mobility: Secondary | ICD-10-CM | POA: Diagnosis not present

## 2020-06-15 DIAGNOSIS — E7800 Pure hypercholesterolemia, unspecified: Secondary | ICD-10-CM | POA: Diagnosis not present

## 2020-06-15 DIAGNOSIS — L659 Nonscarring hair loss, unspecified: Secondary | ICD-10-CM | POA: Diagnosis not present

## 2020-06-15 DIAGNOSIS — J309 Allergic rhinitis, unspecified: Secondary | ICD-10-CM | POA: Diagnosis not present

## 2020-06-15 DIAGNOSIS — Z0000 Encounter for general adult medical examination without abnormal findings: Secondary | ICD-10-CM | POA: Diagnosis not present

## 2020-06-15 DIAGNOSIS — M1990 Unspecified osteoarthritis, unspecified site: Secondary | ICD-10-CM | POA: Diagnosis not present

## 2020-06-15 DIAGNOSIS — G4700 Insomnia, unspecified: Secondary | ICD-10-CM | POA: Diagnosis not present

## 2020-06-15 DIAGNOSIS — M810 Age-related osteoporosis without current pathological fracture: Secondary | ICD-10-CM | POA: Diagnosis not present

## 2020-06-15 DIAGNOSIS — F411 Generalized anxiety disorder: Secondary | ICD-10-CM | POA: Diagnosis not present

## 2020-06-15 DIAGNOSIS — E039 Hypothyroidism, unspecified: Secondary | ICD-10-CM | POA: Diagnosis not present

## 2020-06-21 ENCOUNTER — Other Ambulatory Visit: Payer: Self-pay | Admitting: Family Medicine

## 2020-06-21 DIAGNOSIS — Z1231 Encounter for screening mammogram for malignant neoplasm of breast: Secondary | ICD-10-CM

## 2020-07-06 ENCOUNTER — Ambulatory Visit: Payer: Medicare PPO | Admitting: Family Medicine

## 2020-07-27 DIAGNOSIS — R42 Dizziness and giddiness: Secondary | ICD-10-CM | POA: Diagnosis not present

## 2020-08-09 ENCOUNTER — Ambulatory Visit: Payer: Medicare PPO

## 2020-08-13 ENCOUNTER — Ambulatory Visit
Admission: RE | Admit: 2020-08-13 | Discharge: 2020-08-13 | Disposition: A | Discharge status: Home / Self Care | Payer: Medicare PPO | Source: Ambulatory Visit | Attending: Family Medicine | Admitting: Family Medicine

## 2020-08-13 ENCOUNTER — Other Ambulatory Visit: Payer: Self-pay

## 2020-08-13 DIAGNOSIS — Z1231 Encounter for screening mammogram for malignant neoplasm of breast: Secondary | ICD-10-CM

## 2020-08-16 DIAGNOSIS — M810 Age-related osteoporosis without current pathological fracture: Secondary | ICD-10-CM | POA: Diagnosis not present

## 2020-09-07 DIAGNOSIS — L259 Unspecified contact dermatitis, unspecified cause: Secondary | ICD-10-CM | POA: Diagnosis not present

## 2020-09-26 ENCOUNTER — Ambulatory Visit: Payer: Medicare PPO | Admitting: Family Medicine

## 2020-09-26 ENCOUNTER — Other Ambulatory Visit: Payer: Self-pay

## 2020-09-26 DIAGNOSIS — M7742 Metatarsalgia, left foot: Secondary | ICD-10-CM | POA: Diagnosis not present

## 2020-09-26 NOTE — Patient Instructions (Signed)
Your pain is due to metatarsalgia. Your plantar fasciitis has improved. The metatarsal pads are the most important part of treatment for this to take pressure off the forefoot (metatarsal heads). Ibuprofen, tylenol if needed. Icing if needed. Do SI joint stretches for right SI joint also. If you do well with the pads, they need to be replaced every 3-6 months. Message me to let me know how you're doing over the next 1-2 weeks.

## 2020-09-27 ENCOUNTER — Encounter: Payer: Self-pay | Admitting: Family Medicine

## 2020-09-27 NOTE — Progress Notes (Signed)
PCP: Mayra Neer, MD  Subjective:   HPI: Patient is a 70 y.o. female here for left foot, bilateral knee pain.  3/23: Patient reports she's had plantar left heel pain since September. She had started with heel cups, nitro patches, eccentric exercise program though exercises were too painful so held off on these. Was not improving so had MRI of foot and ankle which showed severe plantar fasciitis as well as stress reactions 2nd metatarsal and middle cuneiform. She has been wearing a boot up until about a week ago (wore for total of a month). Pain dorsal left foot has improved but heel pain persists. Now wearing Hokas when she can (very recent) though when on feet at work she's wearing merrells. Also having bilateral anterior knee pain worse with stairs - is s/p bilateral knee replacements.  7/25: Patient reports her heel pain has improved since last visit. Primary issue is pain she gets more into arch of the foot though this feels relatively good today. Also pain more in forefoot where she's previously had a stress fracture. She has tried different shoes and sandals including merrells, birkenstocks, fitflops. Tried hokas also but didn't have much success with these. Sports insoles felt like they were crowding her shoes too much for comfort.  Past Medical History:  Diagnosis Date   Anal fissure    Breast cancer, right breast ([place]) 11/1997   S/P lumphectomy, chemo,  and radiation therapy   Broken arm 6/16   right   Hypercholesteremia    Hypothyroidism    Osteoarthritis    "knees; maybe in my lower back" (04/23/2017)   Personal history of chemotherapy    Personal history of radiation therapy    Thyroid disease    hypothyroid    Current Outpatient Medications on File Prior to Visit  Medication Sig Dispense Refill   ALPRAZolam (XANAX) 0.5 MG tablet Take 0.25-0.5 mg by mouth 2 (two) times daily as needed for anxiety.     AMBULATORY NON FORMULARY MEDICATION Walking boot for left  foot 1 Units 0   AMBULATORY NON FORMULARY MEDICATION Walking boot left foot. M79.672, M25.572 1 Units 0   Biotin 10000 MCG TABS Take 1 tablet by mouth daily.      CALCIUM PO Take by mouth.     clobetasol ointment (TEMOVATE) [id number] % Apply 1 application topically as needed.     Coenzyme Q10 (COQ10) 400 MG CAPS Take 1 capsule by mouth daily.     denosumab (PROLIA) 60 MG/ML SOLN injection Inject 60 mg into the skin every 6 (six) months. Administer in upper arm, thigh, or abdomen     levothyroxine (SYNTHROID, LEVOTHROID) 75 MCG tablet Take 75 mcg by mouth daily before breakfast.      minoxidil (LONITEN) 2.5 MG tablet      Omega-3 Fatty Acids (FISH OIL) 1200 MG CAPS Take 1 capsule by mouth daily.      simvastatin (ZOCOR) 5 MG tablet Take 5 mg by mouth daily.     triamcinolone (KENALOG) 0.1 % paste      VITAMIN D PO Take by mouth.     No current facility-administered medications on file prior to visit.    Past Surgical History:  Procedure Laterality Date   BLADDER SUSPENSION  02/2002   sparc   BREAST BIOPSY Right 1999   BREAST LUMPECTOMY Right 11/1997   MOUTH SURGERY     "dental implants"   NASAL SINUS SURGERY  12/2015   TOTAL KNEE ARTHROPLASTY Right 04/22/2017   Procedure: TOTAL  KNEE ARTHROPLASTY;  Surgeon: Frederik Pear, MD;  Location: [facility];  Service: Orthopedics;  Laterality: Right;   TOTAL KNEE ARTHROPLASTY Left 04/2019   TUBAL LIGATION     VAGINAL HYSTERECTOMY  02/2002   LAVH-BSO    Allergies  Allergen Reactions   Gabapentin Other (See Comments)    Gave pt migraine    Social History   Socioeconomic History   Marital status: Married    Spouse name: Not on file   Number of children: 2   Years of education: Not on file   Highest education level: Not on file  Occupational History   Occupation: retired   Occupation: pt time at [employer] Use   Smoking status: Never   Smokeless tobacco: Never  Vaping Use   Vaping Use: Never used  Substance and Sexual Activity    Alcohol use: Yes    Alcohol/week: 6.0 - 8.0 standard drinks    Types: 6 - 8 Standard drinks or equivalent per week    Comment: 2 + per day   Drug use: No   Sexual activity: Not Currently    Partners: Male    Birth control/protection: Surgical    Comment: LAVH  Other Topics Concern   Not on file  Social History Narrative   Not on file   Social Determinants of Health   Financial Resource Strain: Not on file  Food Insecurity: Not on file  Transportation Needs: Not on file  Physical Activity: Not on file  Stress: Not on file  Social Connections: Not on file  Intimate Partner Violence: Not on file    Family History  Problem Relation Age of Onset   Rheum arthritis Mother 40   Leukemia Mother    Non-Hodgkin's lymphoma Sister    Other Father 2       accident    BP 132/70   Ht 5' 5.25" (1.657 m)   Wt 130 lb (59 kg)   LMP 03/05/1997   BMI 21.47 kg/m   No flowsheet data found.  No flowsheet data found.  Review of Systems: See HPI above.     Objective:  Physical Exam:  Gen: NAD, comfortable in exam room  Left foot: Bunion, bunionette, wide forefoot with transverse arch collapse without callus.  No other gross deformity, swelling, ecchymoses FROM ankle, digits without pain. Mild TTP plantar MT heads and 3rd MT distally dorsally. NV intact distally.  Limited MSK u/s left foot:  No cortical irregularity, edema overlying cortex of 3rd digit, 2nd-4th metatarsals.   Assessment & Plan:  1. Left foot pain - plantar fasciitis has healed. No evidence new stress fracture/reaction.  Pain currently consistent with metatarsalgia.  Can hold her home exercises/stretches for now.  MT pads placed.  Icing, ibuprofen or tylenol if needed.  She also had question about some pain right low back - focused directly over SI joint.  Given stretches for this as well.

## 2020-11-23 DIAGNOSIS — Z853 Personal history of malignant neoplasm of breast: Secondary | ICD-10-CM | POA: Diagnosis not present

## 2020-11-23 DIAGNOSIS — Z01419 Encounter for gynecological examination (general) (routine) without abnormal findings: Secondary | ICD-10-CM | POA: Diagnosis not present

## 2020-12-02 IMAGING — CT CT CARDIAC CORONARY ARTERY CALCIUM SCORE
3 series · 14 of 20 positions shown, 16 images · non-contrast
Comparison: None.

CLINICAL DATA: 69-year-old Caucasian female.  Hypercholesterolemia.

EXAM:
CT CARDIAC CORONARY ARTERY CALCIUM SCORE
TECHNIQUE: Non-contrast imaging through the heart was performed using
prospective ECG gating. Image post processing was performed on an
independent workstation, allowing for quantitative analysis of the
heart and coronary arteries. Note that this exam targets the heart
and the chest was not imaged in its entirety.

[Series 2: calcium scoring 2.00 qr36 bestdiast 69% hrt calciu · axial · 0.37mm/px · z∈[+1557,+1641]mm · 4 of 70 slices shown]
[im 14/70  vessel]
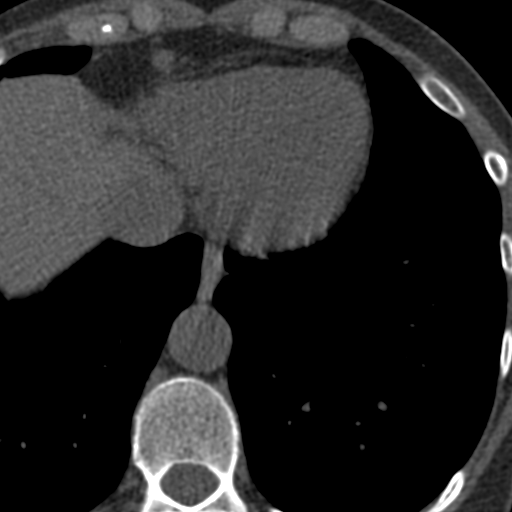
[im 28/70  vessel]
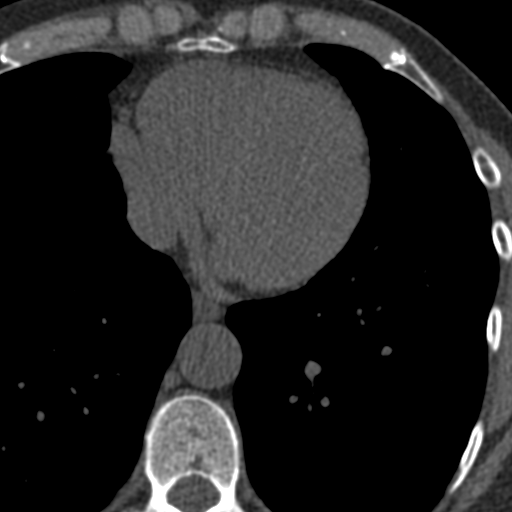
[im 42/70  vessel]
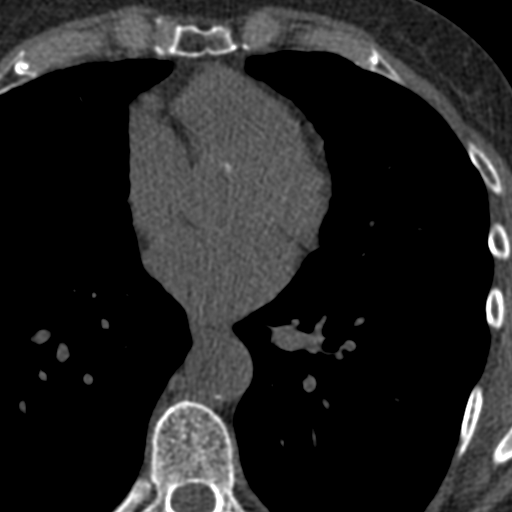
[im 56/70  vessel]
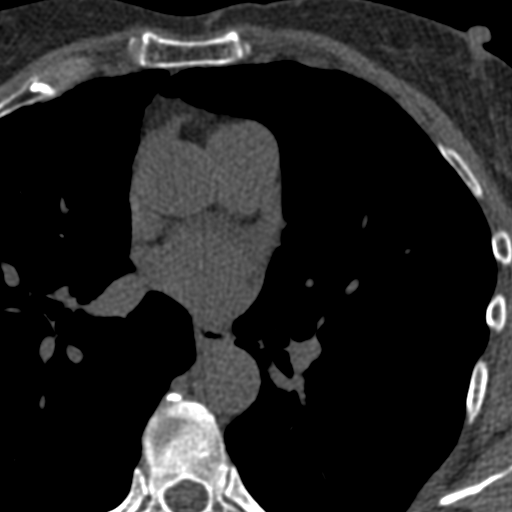

[Series 3: calcium scoring 2.00 br40 bestdiast 69% axial · axial · 0.53mm/px · z∈[+1553,+1645]mm · 5 of 70 slices shown, 7 images]
[im 12/70  vessel]
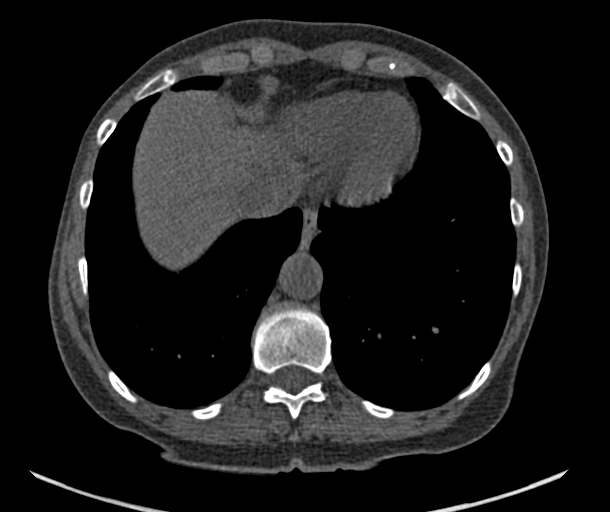
[im 12/70  lung]
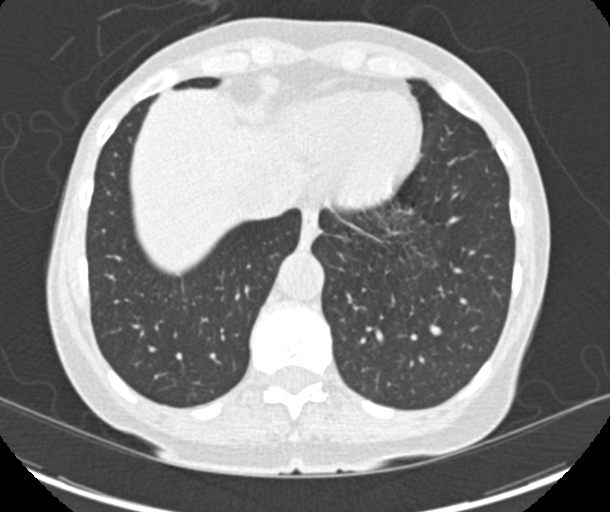
[im 24/70  vessel]
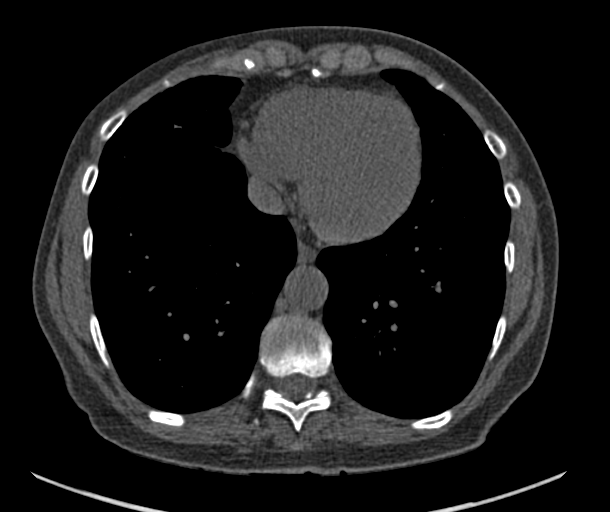
[im 35/70  vessel]
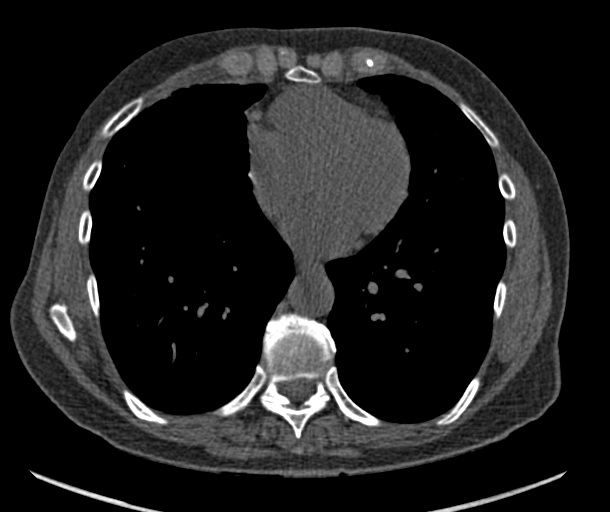
[im 47/70  vessel]
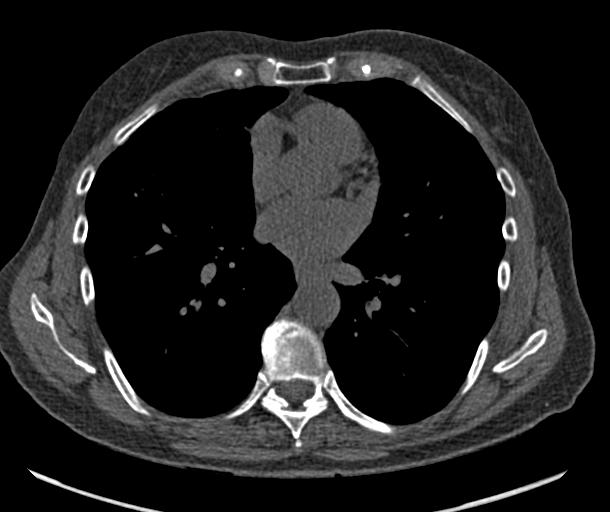
[im 58/70  vessel]
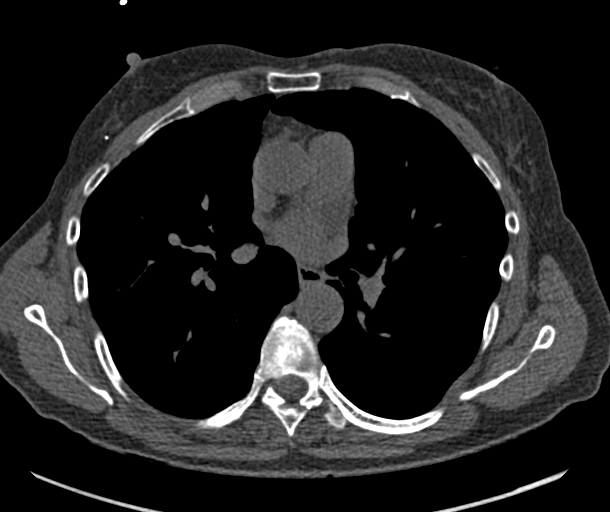
[im 58/70  lung]
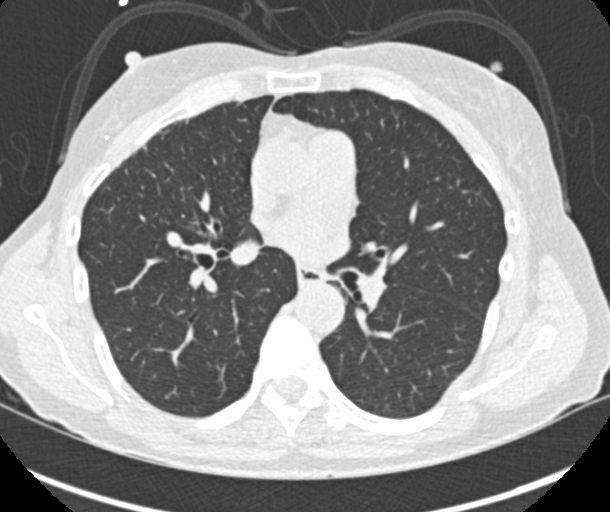

[Series 9: calcium scoring 2.00 br60 bestdiast 69% lungs · axial · 0.53mm/px · z∈[+1553,+1645]mm · 5 of 70 slices shown]
[im 12/70  vessel]
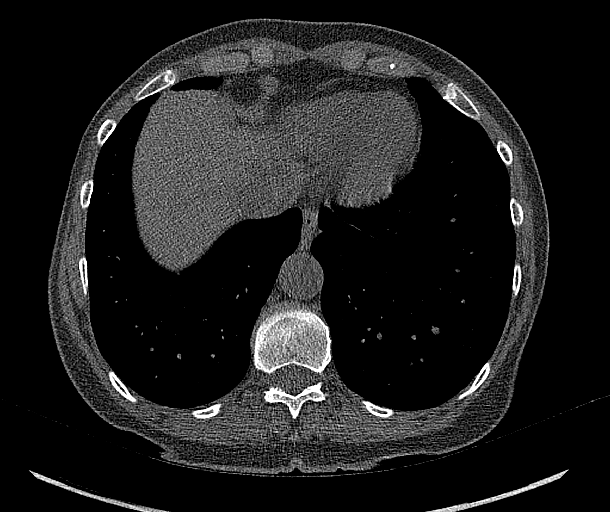
[im 24/70  vessel]
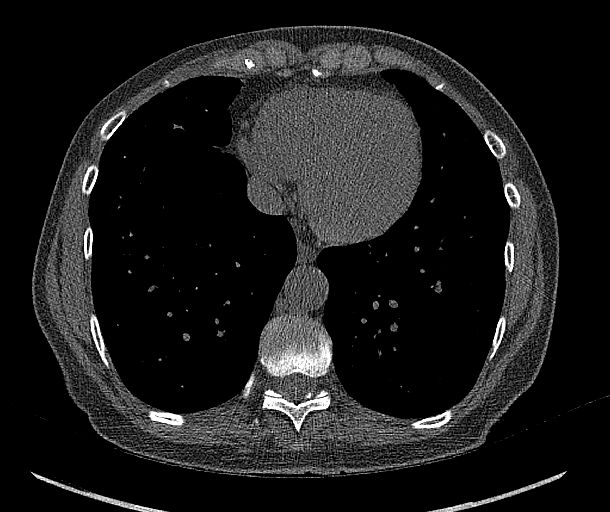
[im 35/70  vessel]
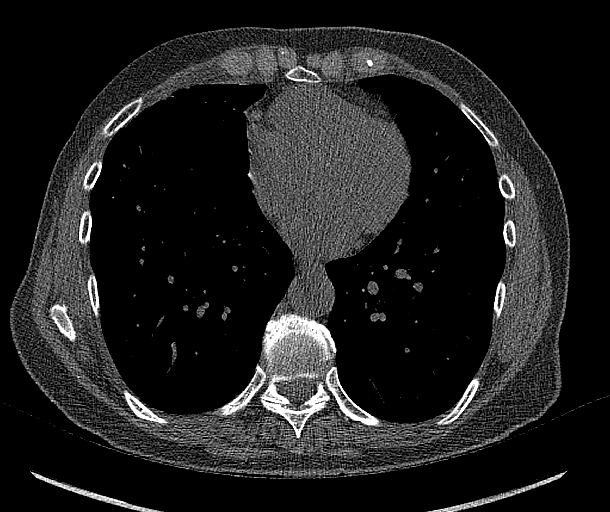
[im 47/70  vessel]
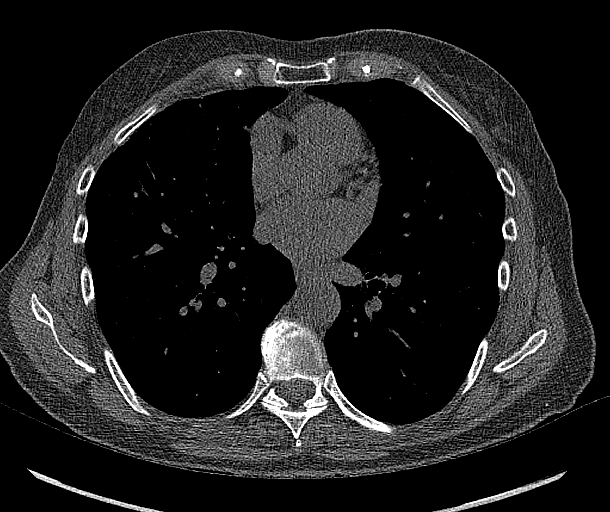
[im 58/70  vessel]
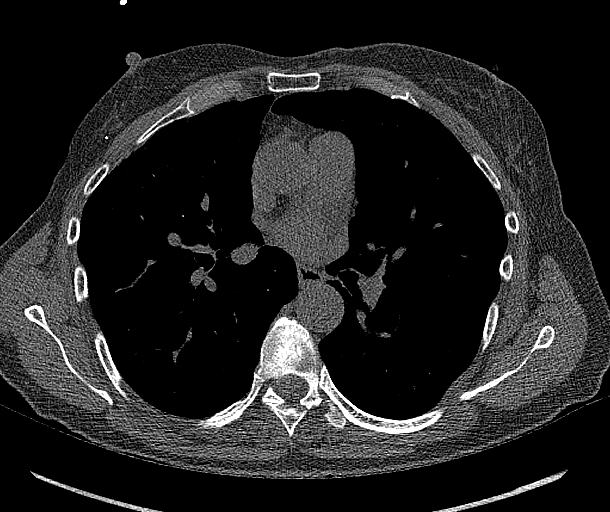

[14 of 20 positions shown; findings below may reference images not displayed]

FINDINGS: Technical quality: Good

CORONARY CALCIUM SCORES:

Left Main: No coronary artery calcification

LAD:

LCx: No coronary calcification

RCA: No coronary calcification

CORONARY CALCIUM

Total Agatston Score:

[HOSPITAL] percentile: 47

Ascending aorta (normal <  40 mm): 26 mm

EXTRACARDIAC FINDINGS:

Limited view of the lung parenchyma demonstrates no suspicious
nodularity. Airways are normal.

Limited view of the mediastinum demonstrates no adenopathy.
Esophagus normal.

Limited view of the upper abdomen is unremarkable.

Limited view of the skeleton and chest wall is unremarkable.
IMPRESSION: 1. LAD coronary artery calcification.

2. Total Agatston Score: 13

3. MESA age and sex matched database percentile: 47

## 2020-12-12 DIAGNOSIS — C50911 Malignant neoplasm of unspecified site of right female breast: Secondary | ICD-10-CM | POA: Diagnosis not present

## 2020-12-17 DIAGNOSIS — Z23 Encounter for immunization: Secondary | ICD-10-CM | POA: Diagnosis not present

## 2020-12-26 DIAGNOSIS — M179 Osteoarthritis of knee, unspecified: Secondary | ICD-10-CM | POA: Diagnosis not present

## 2020-12-26 DIAGNOSIS — R531 Weakness: Secondary | ICD-10-CM | POA: Diagnosis not present

## 2020-12-26 DIAGNOSIS — M25561 Pain in right knee: Secondary | ICD-10-CM | POA: Diagnosis not present

## 2020-12-26 DIAGNOSIS — M25562 Pain in left knee: Secondary | ICD-10-CM | POA: Diagnosis not present

## 2020-12-26 DIAGNOSIS — F4320 Adjustment disorder, unspecified: Secondary | ICD-10-CM | POA: Diagnosis not present

## 2020-12-27 DIAGNOSIS — C50911 Malignant neoplasm of unspecified site of right female breast: Secondary | ICD-10-CM | POA: Diagnosis not present

## 2021-01-02 DIAGNOSIS — M179 Osteoarthritis of knee, unspecified: Secondary | ICD-10-CM | POA: Diagnosis not present

## 2021-01-02 DIAGNOSIS — M25562 Pain in left knee: Secondary | ICD-10-CM | POA: Diagnosis not present

## 2021-01-02 DIAGNOSIS — R531 Weakness: Secondary | ICD-10-CM | POA: Diagnosis not present

## 2021-01-02 DIAGNOSIS — M25561 Pain in right knee: Secondary | ICD-10-CM | POA: Diagnosis not present

## 2021-01-03 DIAGNOSIS — C50911 Malignant neoplasm of unspecified site of right female breast: Secondary | ICD-10-CM | POA: Diagnosis not present

## 2021-01-03 DIAGNOSIS — L649 Androgenic alopecia, unspecified: Secondary | ICD-10-CM | POA: Diagnosis not present

## 2021-01-03 DIAGNOSIS — L570 Actinic keratosis: Secondary | ICD-10-CM | POA: Diagnosis not present

## 2021-01-04 DIAGNOSIS — M810 Age-related osteoporosis without current pathological fracture: Secondary | ICD-10-CM | POA: Diagnosis not present

## 2021-01-04 DIAGNOSIS — R946 Abnormal results of thyroid function studies: Secondary | ICD-10-CM | POA: Diagnosis not present

## 2021-01-04 DIAGNOSIS — I6529 Occlusion and stenosis of unspecified carotid artery: Secondary | ICD-10-CM | POA: Diagnosis not present

## 2021-01-04 DIAGNOSIS — R7989 Other specified abnormal findings of blood chemistry: Secondary | ICD-10-CM | POA: Diagnosis not present

## 2021-01-04 DIAGNOSIS — E039 Hypothyroidism, unspecified: Secondary | ICD-10-CM | POA: Diagnosis not present

## 2021-01-04 DIAGNOSIS — G4700 Insomnia, unspecified: Secondary | ICD-10-CM | POA: Diagnosis not present

## 2021-01-04 DIAGNOSIS — E041 Nontoxic single thyroid nodule: Secondary | ICD-10-CM | POA: Diagnosis not present

## 2021-01-04 DIAGNOSIS — E7800 Pure hypercholesterolemia, unspecified: Secondary | ICD-10-CM | POA: Diagnosis not present

## 2021-01-05 DIAGNOSIS — H25011 Cortical age-related cataract, right eye: Secondary | ICD-10-CM | POA: Diagnosis not present

## 2021-01-05 DIAGNOSIS — H5203 Hypermetropia, bilateral: Secondary | ICD-10-CM | POA: Diagnosis not present

## 2021-01-05 DIAGNOSIS — H2513 Age-related nuclear cataract, bilateral: Secondary | ICD-10-CM | POA: Diagnosis not present

## 2021-01-06 DIAGNOSIS — R531 Weakness: Secondary | ICD-10-CM | POA: Diagnosis not present

## 2021-01-06 DIAGNOSIS — M179 Osteoarthritis of knee, unspecified: Secondary | ICD-10-CM | POA: Diagnosis not present

## 2021-01-06 DIAGNOSIS — M25561 Pain in right knee: Secondary | ICD-10-CM | POA: Diagnosis not present

## 2021-01-06 DIAGNOSIS — M25562 Pain in left knee: Secondary | ICD-10-CM | POA: Diagnosis not present

## 2021-01-12 DIAGNOSIS — F4320 Adjustment disorder, unspecified: Secondary | ICD-10-CM | POA: Diagnosis not present

## 2021-01-13 DIAGNOSIS — R945 Abnormal results of liver function studies: Secondary | ICD-10-CM | POA: Diagnosis not present

## 2021-01-13 DIAGNOSIS — E041 Nontoxic single thyroid nodule: Secondary | ICD-10-CM | POA: Diagnosis not present

## 2021-01-13 DIAGNOSIS — R7989 Other specified abnormal findings of blood chemistry: Secondary | ICD-10-CM | POA: Diagnosis not present

## 2021-01-16 DIAGNOSIS — M25562 Pain in left knee: Secondary | ICD-10-CM | POA: Diagnosis not present

## 2021-01-16 DIAGNOSIS — M179 Osteoarthritis of knee, unspecified: Secondary | ICD-10-CM | POA: Diagnosis not present

## 2021-01-16 DIAGNOSIS — R531 Weakness: Secondary | ICD-10-CM | POA: Diagnosis not present

## 2021-01-16 DIAGNOSIS — M25561 Pain in right knee: Secondary | ICD-10-CM | POA: Diagnosis not present

## 2021-01-19 DIAGNOSIS — F4320 Adjustment disorder, unspecified: Secondary | ICD-10-CM | POA: Diagnosis not present

## 2021-01-23 ENCOUNTER — Ambulatory Visit: Payer: Medicare PPO | Admitting: Family Medicine

## 2021-01-23 ENCOUNTER — Encounter: Payer: Self-pay | Admitting: Family Medicine

## 2021-01-23 VITALS — BP 134/86 | Ht 65.0 in | Wt 132.0 lb

## 2021-01-23 DIAGNOSIS — M545 Low back pain, unspecified: Secondary | ICD-10-CM | POA: Diagnosis not present

## 2021-01-23 MED ORDER — TRAMADOL HCL 50 MG PO TABS: 50.0000 mg | ORAL_TABLET | Freq: Four times a day (QID) | ORAL | 0 refills | Status: DC | PRN

## 2021-01-23 NOTE — Progress Notes (Signed)
PCP: Mayra Neer, MD  Subjective:   HPI: Patient is a 70 y.o. female here for low back pain.  Patient has known history of lumbar spinal stenosis with pain back in 2013. She woke up with current pain on right side of low back then about 1 week ago pain was more on left side. No acute injury or trauma. Pain was bad initially, eased, then worsened again a few days ago. Taken tramadol which helps some. No bowel/bladder dysfunction. No radiation into legs. No numbness or tingling. Has been working a little with physical therapy since this started (has been going for knee pain).  Past Medical History:  Diagnosis Date   Anal fissure    Breast cancer, right breast ([place]) 11/1997   S/P lumphectomy, chemo,  and radiation therapy   Broken arm 6/16   right   Hypercholesteremia    Hypothyroidism    Osteoarthritis    "knees; maybe in my lower back" (04/23/2017)   Personal history of chemotherapy    Personal history of radiation therapy    Thyroid disease    hypothyroid    Current Outpatient Medications on File Prior to Visit  Medication Sig Dispense Refill   ALPRAZolam (XANAX) 0.5 MG tablet Take 0.25-0.5 mg by mouth 2 (two) times daily as needed for anxiety.     AMBULATORY NON FORMULARY MEDICATION Walking boot for left foot 1 Units 0   AMBULATORY NON FORMULARY MEDICATION Walking boot left foot. M79.672, M25.572 1 Units 0   Biotin 10000 MCG TABS Take 1 tablet by mouth daily.      CALCIUM PO Take by mouth.     clobetasol ointment (TEMOVATE) 6.38 % Apply 1 application topically as needed.     Coenzyme Q10 (COQ10) 400 MG CAPS Take 1 capsule by mouth daily.     denosumab (PROLIA) 60 MG/ML SOLN injection Inject 60 mg into the skin every 6 (six) months. Administer in upper arm, thigh, or abdomen     levothyroxine (SYNTHROID, LEVOTHROID) 75 MCG tablet Take 75 mcg by mouth daily before breakfast.      minoxidil (LONITEN) 2.5 MG tablet      Omega-3 Fatty Acids (FISH OIL) 1200 MG CAPS Take 1  capsule by mouth daily.      simvastatin (ZOCOR) 5 MG tablet Take 5 mg by mouth daily.     triamcinolone (KENALOG) 0.1 % paste      VITAMIN D PO Take by mouth.     No current facility-administered medications on file prior to visit.    Past Surgical History:  Procedure Laterality Date   BLADDER SUSPENSION  02/2002   sparc   BREAST BIOPSY Right 1999   BREAST LUMPECTOMY Right 11/1997   MOUTH SURGERY     "dental implants"   NASAL SINUS SURGERY  12/2015   TOTAL KNEE ARTHROPLASTY Right 04/22/2017   Procedure: TOTAL KNEE ARTHROPLASTY;  Surgeon: Frederik Pear, MD;  Location: [facility];  Service: Orthopedics;  Laterality: Right;   TOTAL KNEE ARTHROPLASTY Left 04/2019   TUBAL LIGATION     VAGINAL HYSTERECTOMY  02/2002   LAVH-BSO    Allergies  Allergen Reactions   Gabapentin Other (See Comments)    Gave pt migraine    BP 134/86   Ht 5\' 5"  (1.651 m)   Wt 132 lb (59.9 kg)   LMP 03/05/1997   BMI 21.97 kg/m   [place] Adult Exercise 01/23/2021  Frequency of aerobic exercise (# of days/week) 4  Average time in minutes 30  Frequency of strengthening activities (# of days/week) 3    No flowsheet data found.      Objective:  Physical Exam:  Gen: NAD, comfortable in exam room  Back: No gross deformity, scoliosis. TTP left SI joint.  No paraspinal tenderness.  No midline or bony TTP. FROM with pain on extension mildly. Strength LEs 5/5 all muscle groups.   1+ MSRs in patellar and achilles tendons, equal bilaterally. Negative SLRs. Sensation intact to light touch bilaterally.   Left hip: No deformity. FROM with 5/5 strength. No tenderness to palpation. NVI distally. Negative logroll Positive faber, negative fadir and piriformis stretches.  Assessment & Plan:  1. Left low back pain - 2/2 sacroiliitis.  Shown home exercises/stretches to do daily.  Avoid extension.  Heat, aleve with tramadol as needed.  Continue physical therapy.  Consider diclofenac or  prednisone dose pack if not improving.

## 2021-01-23 NOTE — Patient Instructions (Signed)
You have sacroiliitis (an inflammation of your SI joint) though may have some mild facet arthritis of your back that is flared up as well. Avoid extension activities. Heat 15 minutes at a time 3-4 times a day. Aleve 2 tabs twice a day with food for pain and inflammation - take for 7-10 days then as needed. Tramadol as needed for severe pain. Do home stretches twice a day - hold for 20-30 seconds when you do the stretch. Continue with physical therapy. Consider nitro patches for the knees if the aleve and continued rehab doesn't help enough. Let me know Wednesday morning how things are going - would consider diclofenac pills or prednisone if not improving.

## 2021-01-25 ENCOUNTER — Encounter: Payer: Self-pay | Admitting: Family Medicine

## 2021-01-30 ENCOUNTER — Other Ambulatory Visit: Payer: Self-pay | Admitting: Family Medicine

## 2021-01-30 MED ORDER — TRAMADOL HCL 50 MG PO TABS: 50.0000 mg | ORAL_TABLET | Freq: Four times a day (QID) | ORAL | 0 refills | Status: DC | PRN

## 2021-02-02 DIAGNOSIS — M25561 Pain in right knee: Secondary | ICD-10-CM | POA: Diagnosis not present

## 2021-02-02 DIAGNOSIS — M179 Osteoarthritis of knee, unspecified: Secondary | ICD-10-CM | POA: Diagnosis not present

## 2021-02-02 DIAGNOSIS — R531 Weakness: Secondary | ICD-10-CM | POA: Diagnosis not present

## 2021-02-02 DIAGNOSIS — M25562 Pain in left knee: Secondary | ICD-10-CM | POA: Diagnosis not present

## 2021-02-03 ENCOUNTER — Other Ambulatory Visit: Payer: Self-pay

## 2021-02-03 DIAGNOSIS — M545 Low back pain, unspecified: Secondary | ICD-10-CM

## 2021-02-03 DIAGNOSIS — F4320 Adjustment disorder, unspecified: Secondary | ICD-10-CM | POA: Diagnosis not present

## 2021-02-03 NOTE — Progress Notes (Signed)
Pt called asking for order for MRI lumbar spine placed per her discussion with Dr. Barbaraann Barthel.  Since it's been 10 years since she had a low back x-ray, she will also get this done prior to the MRI.  Pt understands and agrees with the plan.

## 2021-02-06 ENCOUNTER — Ambulatory Visit
Admission: RE | Admit: 2021-02-06 | Discharge: 2021-02-06 | Disposition: A | Discharge status: Home / Self Care | Payer: Medicare PPO | Source: Ambulatory Visit | Attending: Family Medicine | Admitting: Family Medicine

## 2021-02-06 ENCOUNTER — Other Ambulatory Visit: Payer: Self-pay

## 2021-02-06 DIAGNOSIS — M4316 Spondylolisthesis, lumbar region: Secondary | ICD-10-CM | POA: Diagnosis not present

## 2021-02-06 DIAGNOSIS — M2578 Osteophyte, vertebrae: Secondary | ICD-10-CM | POA: Diagnosis not present

## 2021-02-06 DIAGNOSIS — M545 Low back pain, unspecified: Secondary | ICD-10-CM | POA: Diagnosis not present

## 2021-02-13 DIAGNOSIS — F4320 Adjustment disorder, unspecified: Secondary | ICD-10-CM | POA: Diagnosis not present

## 2021-02-15 ENCOUNTER — Other Ambulatory Visit: Payer: Self-pay

## 2021-02-15 ENCOUNTER — Ambulatory Visit
Admission: RE | Admit: 2021-02-15 | Discharge: 2021-02-15 | Disposition: A | Discharge status: Home / Self Care | Payer: Medicare PPO | Source: Ambulatory Visit | Attending: Family Medicine | Admitting: Family Medicine

## 2021-02-15 DIAGNOSIS — M48061 Spinal stenosis, lumbar region without neurogenic claudication: Secondary | ICD-10-CM | POA: Diagnosis not present

## 2021-02-15 DIAGNOSIS — M545 Low back pain, unspecified: Secondary | ICD-10-CM

## 2021-02-16 ENCOUNTER — Encounter: Payer: Self-pay | Admitting: Family Medicine

## 2021-02-17 NOTE — Telephone Encounter (Signed)
See addendum to her MRI for discussion on this.

## 2021-02-20 DIAGNOSIS — M810 Age-related osteoporosis without current pathological fracture: Secondary | ICD-10-CM | POA: Diagnosis not present

## 2021-03-06 DIAGNOSIS — M25569 Pain in unspecified knee: Secondary | ICD-10-CM | POA: Diagnosis not present

## 2021-03-08 DIAGNOSIS — M47814 Spondylosis without myelopathy or radiculopathy, thoracic region: Secondary | ICD-10-CM | POA: Diagnosis not present

## 2021-03-08 DIAGNOSIS — R202 Paresthesia of skin: Secondary | ICD-10-CM | POA: Diagnosis not present

## 2021-03-08 DIAGNOSIS — M419 Scoliosis, unspecified: Secondary | ICD-10-CM | POA: Diagnosis not present

## 2021-03-08 DIAGNOSIS — G4700 Insomnia, unspecified: Secondary | ICD-10-CM | POA: Diagnosis not present

## 2021-03-16 DIAGNOSIS — M25569 Pain in unspecified knee: Secondary | ICD-10-CM | POA: Diagnosis not present

## 2021-03-22 DIAGNOSIS — M25569 Pain in unspecified knee: Secondary | ICD-10-CM | POA: Diagnosis not present

## 2021-03-23 DIAGNOSIS — F4320 Adjustment disorder, unspecified: Secondary | ICD-10-CM | POA: Diagnosis not present

## 2021-03-24 DIAGNOSIS — L570 Actinic keratosis: Secondary | ICD-10-CM | POA: Diagnosis not present

## 2021-03-24 DIAGNOSIS — L649 Androgenic alopecia, unspecified: Secondary | ICD-10-CM | POA: Diagnosis not present

## 2021-03-24 DIAGNOSIS — L821 Other seborrheic keratosis: Secondary | ICD-10-CM | POA: Diagnosis not present

## 2021-03-27 DIAGNOSIS — M25569 Pain in unspecified knee: Secondary | ICD-10-CM | POA: Diagnosis not present

## 2021-04-05 DIAGNOSIS — M25569 Pain in unspecified knee: Secondary | ICD-10-CM | POA: Diagnosis not present

## 2021-04-06 DIAGNOSIS — F4320 Adjustment disorder, unspecified: Secondary | ICD-10-CM | POA: Diagnosis not present

## 2021-04-19 DIAGNOSIS — F4320 Adjustment disorder, unspecified: Secondary | ICD-10-CM | POA: Diagnosis not present

## 2021-04-26 DIAGNOSIS — M25569 Pain in unspecified knee: Secondary | ICD-10-CM | POA: Diagnosis not present

## 2021-05-03 DIAGNOSIS — M25569 Pain in unspecified knee: Secondary | ICD-10-CM | POA: Diagnosis not present

## 2021-05-03 DIAGNOSIS — F4320 Adjustment disorder, unspecified: Secondary | ICD-10-CM | POA: Diagnosis not present

## 2021-05-08 ENCOUNTER — Other Ambulatory Visit: Payer: Self-pay | Admitting: Family Medicine

## 2021-05-08 DIAGNOSIS — M25569 Pain in unspecified knee: Secondary | ICD-10-CM | POA: Diagnosis not present

## 2021-05-08 DIAGNOSIS — Z1231 Encounter for screening mammogram for malignant neoplasm of breast: Secondary | ICD-10-CM

## 2021-05-22 DIAGNOSIS — M25569 Pain in unspecified knee: Secondary | ICD-10-CM | POA: Diagnosis not present

## 2021-06-01 DIAGNOSIS — M25569 Pain in unspecified knee: Secondary | ICD-10-CM | POA: Diagnosis not present

## 2021-06-07 DIAGNOSIS — M25569 Pain in unspecified knee: Secondary | ICD-10-CM | POA: Diagnosis not present

## 2021-06-08 DIAGNOSIS — C50911 Malignant neoplasm of unspecified site of right female breast: Secondary | ICD-10-CM | POA: Diagnosis not present

## 2021-06-14 DIAGNOSIS — M25569 Pain in unspecified knee: Secondary | ICD-10-CM | POA: Diagnosis not present

## 2021-06-21 DIAGNOSIS — M25569 Pain in unspecified knee: Secondary | ICD-10-CM | POA: Diagnosis not present

## 2021-06-27 DIAGNOSIS — M810 Age-related osteoporosis without current pathological fracture: Secondary | ICD-10-CM | POA: Diagnosis not present

## 2021-06-27 DIAGNOSIS — F411 Generalized anxiety disorder: Secondary | ICD-10-CM | POA: Diagnosis not present

## 2021-06-27 DIAGNOSIS — E7800 Pure hypercholesterolemia, unspecified: Secondary | ICD-10-CM | POA: Diagnosis not present

## 2021-06-27 DIAGNOSIS — E039 Hypothyroidism, unspecified: Secondary | ICD-10-CM | POA: Diagnosis not present

## 2021-06-27 DIAGNOSIS — Z0000 Encounter for general adult medical examination without abnormal findings: Secondary | ICD-10-CM | POA: Diagnosis not present

## 2021-06-27 DIAGNOSIS — G4700 Insomnia, unspecified: Secondary | ICD-10-CM | POA: Diagnosis not present

## 2021-07-05 DIAGNOSIS — M25569 Pain in unspecified knee: Secondary | ICD-10-CM | POA: Diagnosis not present

## 2021-07-19 DIAGNOSIS — M25569 Pain in unspecified knee: Secondary | ICD-10-CM | POA: Diagnosis not present

## 2021-07-25 DIAGNOSIS — M25569 Pain in unspecified knee: Secondary | ICD-10-CM | POA: Diagnosis not present

## 2021-08-01 DIAGNOSIS — M25569 Pain in unspecified knee: Secondary | ICD-10-CM | POA: Diagnosis not present

## 2021-08-10 DIAGNOSIS — M25569 Pain in unspecified knee: Secondary | ICD-10-CM | POA: Diagnosis not present

## 2021-08-15 ENCOUNTER — Ambulatory Visit: Payer: Medicare PPO

## 2021-08-18 ENCOUNTER — Ambulatory Visit
Admission: RE | Admit: 2021-08-18 | Discharge: 2021-08-18 | Disposition: A | Discharge status: Home / Self Care | Payer: Medicare PPO | Source: Ambulatory Visit | Attending: Family Medicine | Admitting: Family Medicine

## 2021-08-18 DIAGNOSIS — Z1231 Encounter for screening mammogram for malignant neoplasm of breast: Secondary | ICD-10-CM

## 2021-08-31 DIAGNOSIS — M810 Age-related osteoporosis without current pathological fracture: Secondary | ICD-10-CM | POA: Diagnosis not present

## 2021-09-22 DIAGNOSIS — L649 Androgenic alopecia, unspecified: Secondary | ICD-10-CM | POA: Diagnosis not present

## 2021-09-22 DIAGNOSIS — L603 Nail dystrophy: Secondary | ICD-10-CM | POA: Diagnosis not present

## 2021-10-02 DIAGNOSIS — M25569 Pain in unspecified knee: Secondary | ICD-10-CM | POA: Diagnosis not present

## 2021-11-16 DIAGNOSIS — H25011 Cortical age-related cataract, right eye: Secondary | ICD-10-CM | POA: Diagnosis not present

## 2021-11-16 DIAGNOSIS — H2513 Age-related nuclear cataract, bilateral: Secondary | ICD-10-CM | POA: Diagnosis not present

## 2021-11-20 DIAGNOSIS — C50911 Malignant neoplasm of unspecified site of right female breast: Secondary | ICD-10-CM | POA: Diagnosis not present

## 2021-12-02 DIAGNOSIS — Z23 Encounter for immunization: Secondary | ICD-10-CM | POA: Diagnosis not present

## 2022-01-01 DIAGNOSIS — Z01419 Encounter for gynecological examination (general) (routine) without abnormal findings: Secondary | ICD-10-CM | POA: Diagnosis not present

## 2022-01-04 NOTE — Therapy (Addendum)
OUTPATIENT PHYSICAL THERAPY FEMALE PELVIC EVALUATION   Patient Name: [name] MRN: [id number] DOB:1950/08/18, 71 y.o., female Today's Date: 01/04/2022   01/05/2022    0806  PT Visits / Re-Eval   Visit Number 1  Number of Visits --  Date for PT Re-Evaluation 03/02/2022  Authorization   Authorization Type [employer]  Authorization Time Period --  Authorization - Visit Number 1  Authorization - Number of Visits 10  Progress Note Due on Visit --  PT Time Calculation   PT Start Time 0800  PT Stop Time 0840  PT Time Calculation (min) 40 min  PT - End of Session   Equipment Utilized During Treatment --  Activity Tolerance Patient tolerated treatment well  Behavior During Therapy WFL for tasks assessed/performed    Past Medical History:  Diagnosis Date   Anal fissure    Breast cancer, right breast ([place]) 11/1997   S/P lumphectomy, chemo,  and radiation therapy   Broken arm 6/16   right   Hypercholesteremia    Hypothyroidism    Osteoarthritis    "knees; maybe in my lower back" (04/23/2017)   Personal history of chemotherapy    Personal history of radiation therapy    Thyroid disease    hypothyroid   Past Surgical History:  Procedure Laterality Date   BLADDER SUSPENSION  02/2002   sparc   BREAST BIOPSY Right 1999   BREAST LUMPECTOMY Right 11/1997   MOUTH SURGERY     "dental implants"   NASAL SINUS SURGERY  12/2015   TOTAL KNEE ARTHROPLASTY Right 04/22/2017   Procedure: TOTAL KNEE ARTHROPLASTY;  Surgeon: Frederik Pear, MD;  Location: [facility];  Service: Orthopedics;  Laterality: Right;   TOTAL KNEE ARTHROPLASTY Left 04/2019   TUBAL LIGATION     VAGINAL HYSTERECTOMY  02/2002   LAVH-BSO   Patient Active Problem List   Diagnosis Date Noted   Intractable left heel pain 03/17/2020   Pain in knee region after total knee replacement ([place]) 11/17/2018   S/P total knee arthroplasty, right 05/06/2018   Primary osteoarthritis of right knee 04/22/2017   Degenerative arthritis of  knee, bilateral 03/29/2016   DDD (degenerative disc disease), cervical 07/13/2014   Osteoarthritis of right knee 11/10/2013   History of breast cancer 05/25/2013   Spinal stenosis of lumbar region with radiculopathy 05/31/2011   Piriformis syndrome of right side 05/09/2011   Lordosis of lumbar region 05/09/2011   Knee pain 08/08/2010    PCP: Mayra Neer, MD  REFERRING PROVIDER: Mayra Neer, MD   REFERRING DIAG: R32 Urine Incontinence  THERAPY DIAG:  No diagnosis found.  Rationale for Evaluation and Treatment: Rehabilitation  ONSET DATE: 2020  SUBJECTIVE:  SUBJECTIVE STATEMENT: Started gradually.. Patient had the stark procedure for the bladder.  Fluid intake: Yes: ice tea, hot tea, water, diet pepsi, electrolytes    PAIN:  Are you having pain? No  PRECAUTIONS: Other: Breast cancer  WEIGHT BEARING RESTRICTIONS: No  FALLS:  Has patient fallen in last 6 months? No  LIVING ENVIRONMENT: Lives with: lives with their spouse  OCCUPATION: part time at the [employer]  PLOF: Independent  PATIENT GOALS: reduce leakage  PERTINENT HISTORY:  Right breast cancer  with chemotherapy and radiation; Hypothyroidism; Bladder suspension; Vaginal Hysterectomy; Left TKR  BOWEL MOVEMENT: No issues   URINATION: Pain with urination: No Fully empty bladder: Yes:   Stream: Strong Urgency: Yes: gush of water Frequency: night time void 2; day time 4-5 Leakage: Urge to void and Walking to the bathroom Pads: Yes: where a panty liner   PREGNANCY: Vaginal deliveries 2 Tearing Yes: sutures   PROLAPSE: None   OBJECTIVE:   DIAGNOSTIC FINDINGS:  none  PATIENT SURVEYS:  PFIQ-7 10; UIQ-7 14  COGNITION: Overall cognitive status: Within functional limits for tasks  assessed     SENSATION: Light touch: Appears intact Proprioception: Appears intact   GAIT:Level of assistance: Complete Independence  POSTURE: No Significant postural limitations  PELVIC ALIGNMENT:  LUMBARAROM/PROM: limited by 25% for Lumbar ROM    LOWER EXTREMITY ROM:  Passive ROM Right eval Left eval  Hip external rotation 42 40   (Blank rows = not tested)  LOWER EXTREMITY MMT:  MMT Right eval Left eval  Hip abduction 4/5 4/5   PALPATION:   General  lifts her rib cage for an abdominal contraction                External Perineal Exam thickness on the lower right perineal area                             Internal Pelvic Floor tightness along the perineal body especially on the right where she has a scar from tearing. Tightness along the introitus  Patient confirms identification and approves PT to assess internal pelvic floor and treatment Yes  PELVIC MMT:   MMT eval  Vaginal 2/5 and after some manual work there was a lift  (Blank rows = not tested)        TONE: Increased at the entrance and first level  PROLAPSE: none  TODAY'S TREATMENT:                                                                                                                              DATE: 01/05/2022  EVAL educated patient on how to perform perineal massage to improve tissue mobility and along the perineal body to elongate her scar   PATIENT EDUCATION:  01/05/2022 Education details: Patient Person educated: Patient Education method: Explanation, Corporate treasurer cues, and Verbal cues Education comprehension: verbalized understanding and needs further education  HOME EXERCISE  PROGRAM: See above.   ASSESSMENT:  CLINICAL IMPRESSION: Patient is a 71  y.o. female who was seen today for physical therapy evaluation and treatment for urinary incontinence. Patient reports she has had issues with urgency and urinary leakage for the past 2 years. She has a history of breast cancer and  hysterectomy. She reports when she has the urge to urinate, she ill have a gush of urine prior to getting to the commode. Pelvic floor strength is 2/5 but after some manual work she was able to hug therapist finger better. She has a perineal scar from a vaginal delivery that is tight. She has tightness along the introitus and vaginal dryness. Therapist did not assess past the first layer of the pelvic floor muscles due to vaginal dryness and atrophy. Therapist did educate patient on moisturizing the vaginal tissue to improve the health.  Patient will benefit from skilled therapy to improve pelvic floor coordination and strength to reduce leakage and urgency.   OBJECTIVE IMPAIRMENTS: decreased coordination, decreased endurance, decreased strength, and increased fascial restrictions.   ACTIVITY LIMITATIONS: continence and locomotion level  PARTICIPATION LIMITATIONS: shopping and community activity  PERSONAL FACTORS: 1-2 comorbidities: Right breast cancer  with chemotherapy and radiation; Hypothyroidism; Bladder suspension; Vaginal Hysterectomy; Left TKR  are also affecting patient's functional outcome.   REHAB POTENTIAL: Excellent  CLINICAL DECISION MAKING: Stable/uncomplicated  EVALUATION COMPLEXITY: Low   GOALS: Goals reviewed with patient? Yes  SHORT TERM GOALS: Target date: 02/02/2022  Patient educated on perineal massage to improve tissue mobility.  Baseline: Goal status: INITIAL  2.  Patient educated on vaginal moisturizers to improve health of vaginal tissue.  Baseline:  Goal status: INITIAL  3.  Patient is able to perform a circular vaginal contraction and good hug of therapist finger.  Baseline:  Goal status: INITIAL   LONG TERM GOALS: Target date: 03/02/2022   Patient independent with advanced HEP for core and pelvic floor strength to reduce leakage.  Baseline:  Goal status: INITIAL  2.  Patient is able to walk to the bathroom without leaking urine after she has the  urge to urinate.  Baseline:  Goal status: INITIAL  3.  Patient pelvic floor strength is >/= 3/5 to reduce the urinary leakage.  Baseline:  Goal status: INITIAL  4.  PFIQ-7 score decreased from 10 to </= 5.  Baseline:  Goal status: INITIAL    PLAN:  PT FREQUENCY: 1x/week  PT DURATION: 8 weeks  PLANNED INTERVENTIONS: Therapeutic exercises, Therapeutic activity, Neuromuscular re-education, Patient/Family education, Self Care, Dry Needling, scar mobilization, Biofeedback, and Manual therapy  PLAN FOR NEXT SESSION: manual work to the pelvic floor and scar, see how the vaginal moisturizer is going, work on quick flicks, abdominal contraction   Earlie Counts, PT 01/05/22 9:22 AM

## 2022-01-05 ENCOUNTER — Ambulatory Visit: Payer: Medicare PPO | Attending: Family Medicine | Admitting: Physical Therapy

## 2022-01-05 ENCOUNTER — Other Ambulatory Visit: Payer: Self-pay

## 2022-01-05 ENCOUNTER — Encounter: Payer: Self-pay | Admitting: Physical Therapy

## 2022-01-05 DIAGNOSIS — N3941 Urge incontinence: Secondary | ICD-10-CM | POA: Diagnosis not present

## 2022-01-05 DIAGNOSIS — R279 Unspecified lack of coordination: Secondary | ICD-10-CM | POA: Insufficient documentation

## 2022-01-05 DIAGNOSIS — M6281 Muscle weakness (generalized): Secondary | ICD-10-CM | POA: Diagnosis not present

## 2022-01-05 NOTE — Patient Instructions (Signed)

## 2022-01-11 ENCOUNTER — Encounter: Payer: Self-pay | Admitting: Family Medicine

## 2022-01-19 ENCOUNTER — Ambulatory Visit: Payer: Medicare PPO | Admitting: Physical Therapy

## 2022-01-19 ENCOUNTER — Encounter: Payer: Self-pay | Admitting: Physical Therapy

## 2022-01-19 DIAGNOSIS — M6281 Muscle weakness (generalized): Secondary | ICD-10-CM

## 2022-01-19 DIAGNOSIS — N3941 Urge incontinence: Secondary | ICD-10-CM | POA: Diagnosis not present

## 2022-01-19 DIAGNOSIS — R279 Unspecified lack of coordination: Secondary | ICD-10-CM

## 2022-01-19 NOTE — Therapy (Signed)
OUTPATIENT PHYSICAL THERAPY TREATMENT NOTE   Patient Name: [name] MRN: [id number] DOB:08-19-1950, 71 y.o., female Today's Date: 01/19/2022  PCP: Mayra Neer, MD  REFERRING PROVIDER: Mayra Neer, MD   END OF SESSION:   PT End of Session - 01/19/22 0851     Visit Number 2    Date for PT Re-Evaluation 03/02/22    Authorization Type [employer]    Authorization Time Period 01/05/22-03/02/22    Authorization - Visit Number 2    Authorization - Number of Visits 8    Progress Note Due on Visit 10    Activity Tolerance Patient tolerated treatment well    Behavior During Therapy [facility] for tasks assessed/performed             Past Medical History:  Diagnosis Date   Anal fissure    Breast cancer, right breast ([place]) 11/1997   S/P lumphectomy, chemo,  and radiation therapy   Broken arm 6/16   right   Hypercholesteremia    Hypothyroidism    Osteoarthritis    "knees; maybe in my lower back" (04/23/2017)   Personal history of chemotherapy    Personal history of radiation therapy    Thyroid disease    hypothyroid   Past Surgical History:  Procedure Laterality Date   BLADDER SUSPENSION  02/2002   sparc   BREAST BIOPSY Right 1999   BREAST LUMPECTOMY Right 11/1997   MOUTH SURGERY     "dental implants"   NASAL SINUS SURGERY  12/2015   TOTAL KNEE ARTHROPLASTY Right 04/22/2017   Procedure: TOTAL KNEE ARTHROPLASTY;  Surgeon: Frederik Pear, MD;  Location: [place];  Service: Orthopedics;  Laterality: Right;   TOTAL KNEE ARTHROPLASTY Left 04/2019   TUBAL LIGATION     VAGINAL HYSTERECTOMY  02/2002   LAVH-BSO   Patient Active Problem List   Diagnosis Date Noted   Intractable left heel pain 03/17/2020   Pain in knee region after total knee replacement ([place]) 11/17/2018   S/P total knee arthroplasty, right 05/06/2018   Primary osteoarthritis of right knee 04/22/2017   Degenerative arthritis of knee, bilateral 03/29/2016   DDD (degenerative disc disease), cervical 07/13/2014    Osteoarthritis of right knee 11/10/2013   History of breast cancer 05/25/2013   Spinal stenosis of lumbar region with radiculopathy 05/31/2011   Piriformis syndrome of right side 05/09/2011   Lordosis of lumbar region 05/09/2011   Knee pain 08/08/2010   REFERRING DIAG: R32 Urine Incontinence   THERAPY DIAG:  No diagnosis found.   Rationale for Evaluation and Treatment: Rehabilitation   ONSET DATE: 2020   SUBJECTIVE:  SUBJECTIVE STATEMENT: I felt fine after the initial evaluation.   When I wake up from a deep sleep and walk to the bathroom my underwear are damp. I am not aware of that happening.    PAIN:  Are you having pain? No   PRECAUTIONS: Other: Breast cancer   WEIGHT BEARING RESTRICTIONS: No   FALLS:  Has patient fallen in last 6 months? No   LIVING ENVIRONMENT: Lives with: lives with their spouse   OCCUPATION: part time at the [employer]   PLOF: Independent   PATIENT GOALS: reduce leakage   PERTINENT HISTORY:  Right breast cancer  with chemotherapy and radiation; Hypothyroidism; Bladder suspension; Vaginal Hysterectomy; Left TKR   BOWEL MOVEMENT: No issues     URINATION: Pain with urination: No Fully empty bladder: Yes:   Stream: Strong Urgency: Yes: gush of water Frequency: night time void 2; day time 4-5 Leakage: Urge to void and Walking to the bathroom Pads: Yes: where a panty liner     PREGNANCY: Vaginal deliveries 2 Tearing Yes: sutures     PROLAPSE: None    OBJECTIVE: (objective measures completed at initial evaluation unless otherwise dated)   (Copy Eval's Objective through Plan section here)    DIAGNOSTIC FINDINGS:  none   PATIENT SURVEYS:  PFIQ-7 10; UIQ-7 14   COGNITION: Overall cognitive status: Within functional limits for tasks  assessed                          SENSATION: Light touch: Appears intact Proprioception: Appears intact     GAIT:Level of assistance: Complete Independence   POSTURE: No Significant postural limitations   PELVIC ALIGNMENT:   LUMBARAROM/PROM: limited by 25% for Lumbar ROM     LOWER EXTREMITY ROM:   Passive ROM Right eval Left eval  Hip external rotation 42 40   (Blank rows = not tested)   LOWER EXTREMITY MMT:   MMT Right eval Left eval  Hip abduction 4/5 4/5    PALPATION:   General  lifts her rib cage for an abdominal contraction                 External Perineal Exam thickness on the lower right perineal area                             Internal Pelvic Floor tightness along the perineal body especially on the right where she has a scar from tearing. Tightness along the introitus   Patient confirms identification and approves PT to assess internal pelvic floor and treatment Yes   PELVIC MMT:   MMT eval 01/19/2022  Vaginal 2/5 and after some manual work there was a lift 3/5 with circular contraction  (Blank rows = not tested)         TONE: Increased at the entrance and first level   PROLAPSE: none   TODAY'S TREATMENT:  01/19/2022 Manual: Scar tissue mobilization: Scar mobilization to the perineal scar to improve mobility Myofascial release: Release of the urogenital diaphragm Spinal mobilization: Internal pelvic floor techniques:No emotional/communication barriers or cognitive limitation. Patient is motivated to learn. Patient understands and agrees with treatment goals and plan. PT explains patient will be examined in standing, sitting, and lying down to see how their muscles and joints work. When they are ready, they will be asked to remove their underwear so PT can examine their perineum. The patient is also given the  option of providing their own chaperone as one is not provided in our facility. The patient also has the right and is explained the right  to defer or refuse any part of the evaluation or treatment including the internal exam. With the patient's consent, PT will use one gloved finger to gently assess the muscles of the pelvic floor, seeing how well it contracts and relaxes and if there is muscle symmetry. After, the patient will get dressed and PT and patient will discuss exam findings and plan of care. PT and patient discuss plan of care, schedule, attendance policy and HEP activities.  Going through the vagina working on the pubovaginalis, along the perineal body, and sides of the introitus Neuromuscular re-education: Pelvic floor contraction training: Bridge with ball 10x Transverse abdominus contraction holding 5 sec with ball squeeze Hip flexion isometric holding 5 sec 5x each leg Exercises: Strengthening: bike 5 min. Level 2 while assessing patient.  Self-care: I am using the Good Clean love and used it everyday. Had some spotting and uncomfortable but now is better.                                                                                                                                 DATE: 01/05/2022  EVAL educated patient on how to perform perineal massage to improve tissue mobility and along the perineal body to elongate her scar     PATIENT EDUCATION:  01/05/2022 Education details: Access Code: [email or web address] Person educated: Patient Education method: Explanation, Corporate treasurer cues, and Verbal cues Education comprehension: verbalized understanding and needs further education   HOME EXERCISE PROGRAM: 01/19/2022  Access Code: [email or web address] URL: [email or web address] Date: 01/19/2022 Prepared by: Earlie Counts  Exercises - Hooklying Transversus Abdominis Palpation  - 1 x daily - 4 x weekly - 1 sets - 10 reps - hold 5 sec hold - Supine Bridge with Mini Swiss Ball Between Knees  - 1 x daily - 4 x weekly - 1 sets - 10 reps - Hooklying Isometric Hip Flexion  - 1 x daily - 4 x weekly - 1 sets - 5 reps - 5 sec  hold   ASSESSMENT:   CLINICAL IMPRESSION: Patient is a 71  y.o. female who was seen today for physical therapy evaluation and treatment for urinary incontinence. Pelvic floor strength is 3/5 with circular contraction. Patient has improved mobility of the perineal scar. Patient is using the Good Clean Love to the vaginal area and feels a difference. She is doing her perineal massage.   Patient will benefit from skilled therapy to improve pelvic floor coordination and strength to reduce leakage and urgency.    OBJECTIVE IMPAIRMENTS: decreased coordination, decreased endurance, decreased strength, and increased fascial restrictions.    ACTIVITY LIMITATIONS: continence and locomotion level   PARTICIPATION LIMITATIONS: shopping and community activity   PERSONAL FACTORS: 1-2 comorbidities: Right breast cancer  with chemotherapy and radiation; Hypothyroidism; Bladder suspension; Vaginal Hysterectomy; Left TKR  are  also affecting patient's functional outcome.    REHAB POTENTIAL: Excellent   CLINICAL DECISION MAKING: Stable/uncomplicated   EVALUATION COMPLEXITY: Low     GOALS: Goals reviewed with patient? Yes   SHORT TERM GOALS: Target date: 02/02/2022   Patient educated on perineal massage to improve tissue mobility.  Baseline: Goal status: Met 01/19/2022   2.  Patient educated on vaginal moisturizers to improve health of vaginal tissue.  Baseline:  Goal status: Met 01/19/2022   3.  Patient is able to perform a circular vaginal contraction and good hug of therapist finger.  Baseline:  Goal status: Met 03/21/2021     LONG TERM GOALS: Target date: 03/02/2022    Patient independent with advanced HEP for core and pelvic floor strength to reduce leakage.  Baseline:  Goal status: INITIAL   2.  Patient is able to walk to the bathroom without leaking urine after she has the urge to urinate.  Baseline:  Goal status: INITIAL   3.  Patient pelvic floor strength is >/= 3/5 to reduce the  urinary leakage.  Baseline:  Goal status: INITIAL   4.  PFIQ-7 score decreased from 10 to </= 5.  Baseline:  Goal status: INITIAL       PLAN:   PT FREQUENCY: 1x/week   PT DURATION: 8 weeks   PLANNED INTERVENTIONS: Therapeutic exercises, Therapeutic activity, Neuromuscular re-education, Patient/Family education, Self Care, Dry Needling, scar mobilization, Biofeedback, and Manual therapy   PLAN FOR NEXT SESSION: review HEP and progress, pelvic floor contraction holding 5 sec, quick flicks in sitting and standing   Earlie Counts, PT 01/19/22 9:32 AM

## 2022-02-09 ENCOUNTER — Ambulatory Visit: Payer: Medicare PPO | Attending: Family Medicine | Admitting: Physical Therapy

## 2022-02-09 ENCOUNTER — Encounter: Payer: Self-pay | Admitting: Physical Therapy

## 2022-02-09 DIAGNOSIS — R279 Unspecified lack of coordination: Secondary | ICD-10-CM | POA: Diagnosis not present

## 2022-02-09 DIAGNOSIS — N3941 Urge incontinence: Secondary | ICD-10-CM | POA: Insufficient documentation

## 2022-02-09 DIAGNOSIS — M6281 Muscle weakness (generalized): Secondary | ICD-10-CM | POA: Diagnosis not present

## 2022-02-09 NOTE — Therapy (Addendum)
OUTPATIENT PHYSICAL THERAPY TREATMENT NOTE   Patient Name: [name] MRN: [id number] DOB:1950-06-24, 71 y.o., female Today's Date: 02/09/2022  PCP: Mayra Neer, MD   REFERRING PROVIDER: Mayra Neer, MD    END OF SESSION:   PT End of Session - 02/09/22 0854     Visit Number 3    Date for PT Re-Evaluation 05/04/21    Authorization Type [employer]    Authorization Time Period 01/05/22-03/02/22    Authorization - Visit Number 3    Authorization - Number of Visits 8    Progress Note Due on Visit 10    PT Start Time 0845    PT Stop Time 0925    PT Time Calculation (min) 40 min    Activity Tolerance Patient tolerated treatment well    Behavior During Therapy [facility] for tasks assessed/performed             Past Medical History:  Diagnosis Date   Anal fissure    Breast cancer, right breast ([place]) 11/1997   S/P lumphectomy, chemo,  and radiation therapy   Broken arm 6/16   right   Hypercholesteremia    Hypothyroidism    Osteoarthritis    "knees; maybe in my lower back" (04/23/2017)   Personal history of chemotherapy    Personal history of radiation therapy    Thyroid disease    hypothyroid   Past Surgical History:  Procedure Laterality Date   BLADDER SUSPENSION  02/2002   sparc   BREAST BIOPSY Right 1999   BREAST LUMPECTOMY Right 11/1997   MOUTH SURGERY     "dental implants"   NASAL SINUS SURGERY  12/2015   TOTAL KNEE ARTHROPLASTY Right 04/22/2017   Procedure: TOTAL KNEE ARTHROPLASTY;  Surgeon: Frederik Pear, MD;  Location: [place];  Service: Orthopedics;  Laterality: Right;   TOTAL KNEE ARTHROPLASTY Left 04/2019   TUBAL LIGATION     VAGINAL HYSTERECTOMY  02/2002   LAVH-BSO   Patient Active Problem List   Diagnosis Date Noted   Intractable left heel pain 03/17/2020   Pain in knee region after total knee replacement ([place]) 11/17/2018   S/P total knee arthroplasty, right 05/06/2018   Primary osteoarthritis of right knee 04/22/2017   Degenerative arthritis of knee,  bilateral 03/29/2016   DDD (degenerative disc disease), cervical 07/13/2014   Osteoarthritis of right knee 11/10/2013   History of breast cancer 05/25/2013   Spinal stenosis of lumbar region with radiculopathy 05/31/2011   Piriformis syndrome of right side 05/09/2011   Lordosis of lumbar region 05/09/2011   Knee pain 08/08/2010   REFERRING DIAG: R32 Urine Incontinence   THERAPY DIAG:  No diagnosis found.   Rationale for Evaluation and Treatment: Rehabilitation   ONSET DATE: 2020   SUBJECTIVE:  SUBJECTIVE STATEMENT: I felt fine after the initial evaluation.   When I wake up from a deep sleep and walk to the bathroom my underwear are damp. I am not aware of that happening.    PAIN:  Are you having pain? No   PRECAUTIONS: Other: Breast cancer   WEIGHT BEARING RESTRICTIONS: No   FALLS:  Has patient fallen in last 6 months? No   LIVING ENVIRONMENT: Lives with: lives with their spouse   OCCUPATION: part time at the [employer]   PLOF: Independent   PATIENT GOALS: reduce leakage   PERTINENT HISTORY:  Right breast cancer  with chemotherapy and radiation; Hypothyroidism; Bladder suspension; Vaginal Hysterectomy; Left TKR   BOWEL MOVEMENT: No issues     URINATION: Pain with urination: No Fully empty bladder: Yes:   Stream: Strong Urgency: Yes: gush of water Frequency: night time void 2; day time 4-5 Leakage: Urge to void and Walking to the bathroom Pads: Yes: where a panty liner     PREGNANCY: Vaginal deliveries 2 Tearing Yes: sutures     PROLAPSE: None    OBJECTIVE: (objective measures completed at initial evaluation unless otherwise dated)     DIAGNOSTIC FINDINGS:  none   PATIENT SURVEYS:  PFIQ-7 10; UIQ-7 14 02/09/2022  PFIQ-7 10; UIQ-7  14 COGNITION: Overall cognitive status: Within functional limits for tasks assessed                          SENSATION: Light touch: Appears intact Proprioception: Appears intact     GAIT:Level of assistance: Complete Independence   POSTURE: No Significant postural limitations   PELVIC ALIGNMENT:   LUMBARAROM/PROM: limited by 25% for Lumbar ROM 02/09/2022: Lumbar ROM decreased by 25%     LOWER EXTREMITY ROM:   Passive ROM Right eval Left eval Right  02/09/22 Left 02/09/22  Hip external rotation 42 40 50 50   (Blank rows = not tested)   LOWER EXTREMITY MMT:   MMT Right eval Left eval Right 02/09/22 Left 02/09/22  Hip abduction 4/5 4/5 4/5 4/5    PALPATION:   General  lifts her rib cage for an abdominal contraction                 External Perineal Exam thickness on the lower right perineal area                             Internal Pelvic Floor tightness along the perineal body especially on the right where she has a scar from tearing. Tightness along the introitus   Patient confirms identification and approves PT to assess internal pelvic floor and treatment Yes   PELVIC MMT:   MMT eval 01/19/2022 02/09/2022  Vaginal 2/5 and after some manual work there was a lift 3/5 with circular contraction Weak 3/5 holding for 3 seconds  (Blank rows = not tested)         TONE: Increased at the entrance and first level   PROLAPSE: none   TODAY'S TREATMENT:  02/09/2022 Manual: Internal pelvic floor techniques:No emotional/communication barriers or cognitive limitation. Patient is motivated to learn. Patient understands and agrees with treatment goals and plan. PT explains patient will be examined in standing, sitting, and lying down to see how their muscles and joints work. When they are ready, they will be asked to remove their underwear so PT can examine their perineum. The patient is also given  the option of providing their own chaperone as one is not provided in our  facility. The patient also has the right and is explained the right to defer or refuse any part of the evaluation or treatment including the internal exam. With the patient's consent, PT will use one gloved finger to gently assess the muscles of the pelvic floor, seeing how well it contracts and relaxes and if there is muscle symmetry. After, the patient will get dressed and PT and patient will discuss exam findings and plan of care. PT and patient discuss plan of care, schedule, attendance policy and HEP activities.  Going through the vagina working on the perineal body, along the urogenital diaphragm, along the ischiocavernosus, along the posterior vaginal canal Neuromuscular re-education: Pelvic floor contraction training: Tactile cues to work on circular contraction with a lift especially along the posterior aspect Exercises: Strengthening: Nustep for 6 minutes level 2 while assessing patient    01/19/2022 Manual: Scar tissue mobilization: Scar mobilization to the perineal scar to improve mobility Myofascial release: Release of the urogenital diaphragm Spinal mobilization: Internal pelvic floor techniques:No emotional/communication barriers or cognitive limitation. Patient is motivated to learn. Patient understands and agrees with treatment goals and plan. PT explains patient will be examined in standing, sitting, and lying down to see how their muscles and joints work. When they are ready, they will be asked to remove their underwear so PT can examine their perineum. The patient is also given the option of providing their own chaperone as one is not provided in our facility. The patient also has the right and is explained the right to defer or refuse any part of the evaluation or treatment including the internal exam. With the patient's consent, PT will use one gloved finger to gently assess the muscles of the pelvic floor, seeing how well it contracts and relaxes and if there is muscle  symmetry. After, the patient will get dressed and PT and patient will discuss exam findings and plan of care. PT and patient discuss plan of care, schedule, attendance policy and HEP activities.  Going through the vagina working on the pubovaginalis, along the perineal body, and sides of the introitus Neuromuscular re-education: Pelvic floor contraction training: Bridge with ball 10x Transverse abdominus contraction holding 5 sec with ball squeeze Hip flexion isometric holding 5 sec 5x each leg Exercises: Strengthening: bike 5 min. Level 2 while assessing patient.  Self-care: I am using the Good Clean love and used it everyday. Had some spotting and uncomfortable but now is better.                                                                                                                                 DATE: 01/05/2022  EVAL educated patient on how to perform perineal massage to improve tissue mobility and along the perineal body to elongate her scar     PATIENT EDUCATION:  01/05/2022 Education details: Access Code: [email or web address]  Person educated: Patient Education method: Explanation, Tactile cues, and Verbal cues Education comprehension: verbalized understanding and needs further education   HOME EXERCISE PROGRAM: 01/19/2022  Access Code: [email or web address] URL: [email or web address] Date: 01/19/2022 Prepared by: Earlie Counts   Exercises - Hooklying Transversus Abdominis Palpation  - 1 x daily - 4 x weekly - 1 sets - 10 reps - hold 5 sec hold - Supine Bridge with Mini Swiss Ball Between Knees  - 1 x daily - 4 x weekly - 1 sets - 10 reps - Hooklying Isometric Hip Flexion  - 1 x daily - 4 x weekly - 1 sets - 5 reps - 5 sec hold   ASSESSMENT:   CLINICAL IMPRESSION: Patient is a 71  y.o. female who was seen today for physical therapy  treatment for urinary incontinence. Pelvic floor strength is 3/5 with circular contraction and holding 3 seconds. Patient has improved mobility of  the perineal scar but still has restrictions and does not move as well with pelvic floor contraction. Patient is using the Good Clean Love to the vaginal area and feels a difference. Vulvar tissue has increased moisture and pink color due to improved blood flow. She is doing her perineal massage.   Patient is having trouble keeping up with her exercise program due to the holidays and travels so she would like to resume therapy 03/05/2022. Patient will benefit from skilled therapy to improve pelvic floor coordination and strength to reduce leakage and urgency.    OBJECTIVE IMPAIRMENTS: decreased coordination, decreased endurance, decreased strength, and increased fascial restrictions.    ACTIVITY LIMITATIONS: continence and locomotion level   PARTICIPATION LIMITATIONS: shopping and community activity   PERSONAL FACTORS: 1-2 comorbidities: Right breast cancer  with chemotherapy and radiation; Hypothyroidism; Bladder suspension; Vaginal Hysterectomy; Left TKR  are also affecting patient's functional outcome.    REHAB POTENTIAL: Excellent   CLINICAL DECISION MAKING: Stable/uncomplicated   EVALUATION COMPLEXITY: Low     GOALS: Goals reviewed with patient? Yes   SHORT TERM GOALS: Target date: 02/02/2022   Patient educated on perineal massage to improve tissue mobility.  Baseline: Goal status: Met 01/19/2022   2.  Patient educated on vaginal moisturizers to improve health of vaginal tissue.  Baseline:  Goal status: Met 01/19/2022   3.  Patient is able to perform a circular vaginal contraction and good hug of therapist finger.  Baseline:  Goal status: Met 03/21/2021     LONG TERM GOALS: Target date: 03/02/2022    Patient independent with advanced HEP for core and pelvic floor strength to reduce leakage.  Baseline:  Goal status: ongoing 02/09/2022   2.  Patient is able to walk to the bathroom without leaking urine after she has the urge to urinate.  Baseline:  Goal status: ongoing  02/09/22   3.  Patient pelvic floor strength is >/= 3/5 to reduce the urinary leakage.  Baseline:  Goal status: ongoing 02/09/22   4.  PFIQ-7 score decreased from 10 to </= 5.  Baseline:  Goal status: ongoing 02/09/2022       PLAN:   PT FREQUENCY: 1x/week   PT DURATION: 8 weeks   PLANNED INTERVENTIONS: Therapeutic exercises, Therapeutic activity, Neuromuscular re-education, Patient/Family education, Self Care, Dry Needling, scar mobilization, Biofeedback, and Manual therapy   PLAN FOR NEXT SESSION: review HEP and progress, pelvic floor contraction holding 5 sec, quick flicks in sitting and standing, manual work on perineal scar and posterior vagina     Earlie Counts, PT 02/09/22 9:31 AM  PHYSICAL THERAPY DISCHARGE SUMMARY  Visits from Start of Care: 3  Current functional level related to goals / functional outcomes: See above. Patient will be attending therapy at a different facility for her scoliosis, sciatica and pelvic floor issues.    Remaining deficits: See above.    Education / Equipment: HEP   Patient agrees to discharge. Patient goals were not met. Patient is being discharged due to the patient's request. Thank you for the referral. Earlie Counts, PT 04/24/22 8:28 AM

## 2022-02-16 ENCOUNTER — Encounter: Payer: Self-pay | Admitting: Physical Therapy

## 2022-02-18 DIAGNOSIS — H6123 Impacted cerumen, bilateral: Secondary | ICD-10-CM | POA: Diagnosis not present

## 2022-02-19 ENCOUNTER — Encounter: Payer: Self-pay | Admitting: Physical Therapy

## 2022-02-21 DIAGNOSIS — H9193 Unspecified hearing loss, bilateral: Secondary | ICD-10-CM | POA: Diagnosis not present

## 2022-03-09 DIAGNOSIS — M810 Age-related osteoporosis without current pathological fracture: Secondary | ICD-10-CM | POA: Diagnosis not present

## 2022-03-13 DIAGNOSIS — H269 Unspecified cataract: Secondary | ICD-10-CM | POA: Diagnosis not present

## 2022-03-13 DIAGNOSIS — H2512 Age-related nuclear cataract, left eye: Secondary | ICD-10-CM | POA: Diagnosis not present

## 2022-03-13 DIAGNOSIS — Z961 Presence of intraocular lens: Secondary | ICD-10-CM | POA: Diagnosis not present

## 2022-03-14 ENCOUNTER — Ambulatory Visit (INDEPENDENT_AMBULATORY_CARE_PROVIDER_SITE_OTHER): Payer: Medicare PPO | Admitting: Family Medicine

## 2022-03-14 VITALS — BP 138/82 | Ht 65.0 in | Wt 130.0 lb

## 2022-03-14 DIAGNOSIS — M5432 Sciatica, left side: Secondary | ICD-10-CM | POA: Diagnosis not present

## 2022-03-14 MED ORDER — DICLOFENAC SODIUM 75 MG PO TBEC: 75.0000 mg | DELAYED_RELEASE_TABLET | Freq: Two times a day (BID) | ORAL | 1 refills | Status: DC

## 2022-03-14 NOTE — Patient Instructions (Signed)
You have sciatica. Ok to take tylenol for baseline pain relief (1-2 extra strength tabs 3x/day) Consider prednisone dose pack as directed. Start voltaren '75mg'$  twice a day with food for pain and inflammation. Stay as active as possible. Physical therapy has been shown to be helpful as well - start this and do home exercises on days you don't go to therapy. If not improving, will consider further imaging (MRI). Follow up with me in 6 weeks or as needed if you're doing well (call me sooner if you're struggling).

## 2022-03-15 NOTE — Progress Notes (Signed)
PCP: Mayra Neer, MD  Subjective:   HPI: Patient is a 72 y.o. female here for left hip/leg pain.  Patient reports she's had a few days of posterior left hip/low back pain. Started with stiffness sensation in this area that has led to a stabbing pain at times radiating down leg. Worse in the morning, better as day goes on. Worse going up stairs and twisting also. Tried tramadol, advil with only a little relief. No bowel/bladder dysfunction. No numbness/tingling.  Past Medical History:  Diagnosis Date   Anal fissure    Breast cancer, right breast ([place]) 11/1997   S/P lumphectomy, chemo,  and radiation therapy   Broken arm 6/16   right   Hypercholesteremia    Hypothyroidism    Osteoarthritis    "knees; maybe in my lower back" (04/23/2017)   Personal history of chemotherapy    Personal history of radiation therapy    Thyroid disease    hypothyroid    Current Outpatient Medications on File Prior to Visit  Medication Sig Dispense Refill   ALPRAZolam (XANAX) 0.5 MG tablet Take 0.25-0.5 mg by mouth 2 (two) times daily as needed for anxiety.     CALCIUM PO Take by mouth.     clobetasol ointment (TEMOVATE) 1.02 % Apply 1 application topically as needed.     Coenzyme Q10 (COQ10) 400 MG CAPS Take 1 capsule by mouth daily.     denosumab (PROLIA) 60 MG/ML SOLN injection Inject 60 mg into the skin every 6 (six) months. Administer in upper arm, thigh, or abdomen     levothyroxine (SYNTHROID, LEVOTHROID) 75 MCG tablet Take 75 mcg by mouth daily before breakfast.      minoxidil (LONITEN) 2.5 MG tablet      Omega-3 Fatty Acids (FISH OIL) 1200 MG CAPS Take 1 capsule by mouth daily.      simvastatin (ZOCOR) 5 MG tablet Take 5 mg by mouth daily.     VITAMIN D PO Take by mouth.     No current facility-administered medications on file prior to visit.    Past Surgical History:  Procedure Laterality Date   BLADDER SUSPENSION  02/2002   sparc   BREAST BIOPSY Right 1999   BREAST LUMPECTOMY  Right 11/1997   MOUTH SURGERY     "dental implants"   NASAL SINUS SURGERY  12/2015   TOTAL KNEE ARTHROPLASTY Right 04/22/2017   Procedure: TOTAL KNEE ARTHROPLASTY;  Surgeon: Frederik Pear, MD;  Location: [facility];  Service: Orthopedics;  Laterality: Right;   TOTAL KNEE ARTHROPLASTY Left 04/2019   TUBAL LIGATION     VAGINAL HYSTERECTOMY  02/2002   LAVH-BSO    Allergies  Allergen Reactions   Gabapentin Other (See Comments)    Gave pt migraine    BP 138/82   Ht '5\' 5"'$  (1.651 m)   Wt 130 lb (59 kg)   LMP 03/05/1997   BMI 21.63 kg/m      01/23/2021    8:45 AM  [place] [facility]  Frequency of aerobic exercise (# of days/week) 4  Average time in minutes 30  Frequency of strengthening activities (# of days/week) 3        No data to display              Objective:  Physical Exam:  Gen: NAD, comfortable in exam room  Back: No gross deformity, scoliosis. No TTP.  No midline or bony TTP. FROM. Strength LEs 5/5 all muscle groups.   2+ MSRs in  patellar and achilles tendons, equal bilaterally. Negative SLRs. Sensation intact to light touch bilaterally.  Left hip: No deformity. FROM with 5/5 strength. No tenderness to palpation. NVI distally. Negative logroll Negative faber, fadir, and piriformis stretches.   Assessment & Plan:  1. Low back pain - current exam and history reassuring.  Describes sciatica.  Tylenol, voltaren twice a day.  Start physical therapy and home exercises.  Consider steroid dose pack, MRI if not improving.  F/u in 6 weeks.

## 2022-03-19 ENCOUNTER — Ambulatory Visit: Payer: Medicare PPO | Admitting: Physical Therapy

## 2022-03-21 ENCOUNTER — Encounter: Payer: Self-pay | Admitting: Family Medicine

## 2022-03-26 DIAGNOSIS — M5440 Lumbago with sciatica, unspecified side: Secondary | ICD-10-CM | POA: Diagnosis not present

## 2022-03-27 DIAGNOSIS — Z961 Presence of intraocular lens: Secondary | ICD-10-CM | POA: Diagnosis not present

## 2022-03-27 DIAGNOSIS — H2511 Age-related nuclear cataract, right eye: Secondary | ICD-10-CM | POA: Diagnosis not present

## 2022-03-27 DIAGNOSIS — Z0100 Encounter for examination of eyes and vision without abnormal findings: Secondary | ICD-10-CM | POA: Diagnosis not present

## 2022-03-27 DIAGNOSIS — H25011 Cortical age-related cataract, right eye: Secondary | ICD-10-CM | POA: Diagnosis not present

## 2022-03-27 DIAGNOSIS — H269 Unspecified cataract: Secondary | ICD-10-CM | POA: Diagnosis not present

## 2022-03-27 DIAGNOSIS — H25811 Combined forms of age-related cataract, right eye: Secondary | ICD-10-CM | POA: Diagnosis not present

## 2022-03-28 ENCOUNTER — Encounter: Payer: Medicare PPO | Admitting: Physical Therapy

## 2022-04-02 DIAGNOSIS — M545 Low back pain, unspecified: Secondary | ICD-10-CM | POA: Diagnosis not present

## 2022-04-04 ENCOUNTER — Encounter: Payer: Self-pay | Admitting: Family Medicine

## 2022-04-05 MED ORDER — PREDNISONE 10 MG PO TABS: ORAL_TABLET | ORAL | 0 refills | Status: DC

## 2022-04-16 DIAGNOSIS — M545 Low back pain, unspecified: Secondary | ICD-10-CM | POA: Diagnosis not present

## 2022-04-24 ENCOUNTER — Encounter: Payer: Self-pay | Admitting: Family Medicine

## 2022-04-27 ENCOUNTER — Encounter: Payer: Medicare PPO | Admitting: Physical Therapy

## 2022-04-30 ENCOUNTER — Encounter: Payer: Self-pay | Admitting: Family Medicine

## 2022-05-01 ENCOUNTER — Other Ambulatory Visit: Payer: Self-pay

## 2022-05-01 DIAGNOSIS — M5432 Sciatica, left side: Secondary | ICD-10-CM

## 2022-05-01 DIAGNOSIS — M5416 Radiculopathy, lumbar region: Secondary | ICD-10-CM

## 2022-05-03 DIAGNOSIS — M545 Low back pain, unspecified: Secondary | ICD-10-CM | POA: Diagnosis not present

## 2022-05-04 ENCOUNTER — Encounter: Payer: Medicare PPO | Admitting: Physical Therapy

## 2022-05-04 MED ORDER — HYDROCODONE-ACETAMINOPHEN 7.5-325 MG PO TABS: 1.0000 | ORAL_TABLET | Freq: Four times a day (QID) | ORAL | 0 refills | Status: DC | PRN

## 2022-05-04 NOTE — Addendum Note (Signed)
Addended by: Dene Gentry on: 05/04/2022 08:55 AM   Modules accepted: Orders

## 2022-05-04 NOTE — Telephone Encounter (Signed)
Patient called - advised going to ED - she would like to hold off on this and try hydrocodone for pain first but understands if not improving still that she needs to go to the ED.

## 2022-05-05 DIAGNOSIS — M545 Low back pain, unspecified: Secondary | ICD-10-CM | POA: Diagnosis not present

## 2022-05-08 MED ORDER — OXYCODONE-ACETAMINOPHEN 7.5-325 MG PO TABS: 1.0000 | ORAL_TABLET | Freq: Four times a day (QID) | ORAL | 0 refills | Status: DC | PRN

## 2022-05-08 NOTE — Addendum Note (Signed)
Addended by: Dene Gentry on: 05/08/2022 08:30 AM   Modules accepted: Orders

## 2022-05-10 ENCOUNTER — Ambulatory Visit (HOSPITAL_BASED_OUTPATIENT_CLINIC_OR_DEPARTMENT_OTHER): Payer: Medicare PPO

## 2022-05-10 ENCOUNTER — Ambulatory Visit (HOSPITAL_COMMUNITY)
Admission: RE | Admit: 2022-05-10 | Discharge: 2022-05-10 | Disposition: A | Discharge status: Home / Self Care | Payer: Medicare PPO | Source: Ambulatory Visit | Attending: Family Medicine | Admitting: Family Medicine

## 2022-05-10 ENCOUNTER — Encounter: Payer: Self-pay | Admitting: Family Medicine

## 2022-05-10 DIAGNOSIS — M5416 Radiculopathy, lumbar region: Secondary | ICD-10-CM | POA: Diagnosis not present

## 2022-05-10 DIAGNOSIS — M4316 Spondylolisthesis, lumbar region: Secondary | ICD-10-CM | POA: Diagnosis not present

## 2022-05-10 DIAGNOSIS — M5432 Sciatica, left side: Secondary | ICD-10-CM | POA: Insufficient documentation

## 2022-05-11 ENCOUNTER — Encounter: Payer: Self-pay | Admitting: Family Medicine

## 2022-05-11 ENCOUNTER — Other Ambulatory Visit: Payer: Medicare PPO

## 2022-05-14 ENCOUNTER — Other Ambulatory Visit: Payer: Self-pay | Admitting: Family Medicine

## 2022-05-14 ENCOUNTER — Other Ambulatory Visit: Payer: Self-pay

## 2022-05-14 DIAGNOSIS — M48061 Spinal stenosis, lumbar region without neurogenic claudication: Secondary | ICD-10-CM

## 2022-05-14 MED ORDER — OXYCODONE-ACETAMINOPHEN 7.5-325 MG PO TABS: 1.0000 | ORAL_TABLET | Freq: Four times a day (QID) | ORAL | 0 refills | Status: DC | PRN

## 2022-05-14 NOTE — Addendum Note (Signed)
Addended by: Dene Gentry on: 05/14/2022 08:28 AM   Modules accepted: Orders

## 2022-05-16 ENCOUNTER — Other Ambulatory Visit: Payer: Medicare PPO

## 2022-05-17 ENCOUNTER — Ambulatory Visit
Admission: RE | Admit: 2022-05-17 | Discharge: 2022-05-17 | Disposition: A | Discharge status: Home / Self Care | Payer: Medicare PPO | Source: Ambulatory Visit | Attending: Family Medicine | Admitting: Family Medicine

## 2022-05-17 DIAGNOSIS — M48061 Spinal stenosis, lumbar region without neurogenic claudication: Secondary | ICD-10-CM

## 2022-05-17 DIAGNOSIS — M7138 Other bursal cyst, other site: Secondary | ICD-10-CM | POA: Diagnosis not present

## 2022-05-17 DIAGNOSIS — M47817 Spondylosis without myelopathy or radiculopathy, lumbosacral region: Secondary | ICD-10-CM | POA: Diagnosis not present

## 2022-05-17 MED ORDER — IOPAMIDOL (ISOVUE-M 200) INJECTION 41%
1.0000 mL | Freq: Once | INTRAMUSCULAR | Status: AC
  Administered 2022-05-17: 1 mL via EPIDURAL

## 2022-05-17 MED ORDER — METHYLPREDNISOLONE ACETATE 40 MG/ML INJ SUSP (RADIOLOG
80.0000 mg | Freq: Once | INTRAMUSCULAR | Status: AC
  Administered 2022-05-17: 80 mg via EPIDURAL

## 2022-05-17 NOTE — Discharge Instructions (Signed)

## 2022-05-21 DIAGNOSIS — M545 Low back pain, unspecified: Secondary | ICD-10-CM | POA: Diagnosis not present

## 2022-05-25 MED ORDER — OXYCODONE-ACETAMINOPHEN 7.5-325 MG PO TABS: 1.0000 | ORAL_TABLET | Freq: Four times a day (QID) | ORAL | 0 refills | Status: DC | PRN

## 2022-05-31 DIAGNOSIS — M5416 Radiculopathy, lumbar region: Secondary | ICD-10-CM | POA: Diagnosis not present

## 2022-06-04 ENCOUNTER — Ambulatory Visit: Payer: Medicare PPO | Admitting: Family Medicine

## 2022-06-04 VITALS — BP 128/84 | Ht 65.0 in | Wt 138.0 lb

## 2022-06-04 DIAGNOSIS — M48061 Spinal stenosis, lumbar region without neurogenic claudication: Secondary | ICD-10-CM | POA: Diagnosis not present

## 2022-06-04 MED ORDER — OXYCODONE-ACETAMINOPHEN 7.5-325 MG PO TABS: 1.0000 | ORAL_TABLET | Freq: Four times a day (QID) | ORAL | 0 refills | Status: DC | PRN

## 2022-06-04 NOTE — Progress Notes (Signed)
PCP: Mayra Neer, MD  Subjective:   HPI: Patient is a 72 y.o. female here for left hip/leg pain.  1/10: Patient reports she's had a few days of posterior left hip/low back pain. Started with stiffness sensation in this area that has led to a stabbing pain at times radiating down leg. Worse in the morning, better as day goes on. Worse going up stairs and twisting also. Tried tramadol, advil with only a little relief. No bowel/bladder dysfunction. No numbness/tingling.  4/1: Patient returns today after seeing neurosurgery, Dr. Ronnald Ramp on Thursday. She had an epidural steroid injection on the left at L3-L4 on March 14 which only provided temporary relief and her pain is recurred to the same severity. I discussion with Dr. Ronnald Ramp she was given a few options including attempted aspiration and injection of the cyst in this area but was given about a 25% chance of success given the location of this 1.  A second option was to do a partial laminectomy and remove the cyst which was favored.  A third and final option would be decompression with a fusion and removal of the cyst as well. She is not having a bowel or bladder dysfunction currently. She is getting shooting pains down both of her legs at times.  Past Medical History:  Diagnosis Date   Anal fissure    Breast cancer, right breast ([place]) 11/1997   S/P lumphectomy, chemo,  and radiation therapy   Broken arm 6/16   right   Hypercholesteremia    Hypothyroidism    Osteoarthritis    "knees; maybe in my lower back" (04/23/2017)   Personal history of chemotherapy    Personal history of radiation therapy    Thyroid disease    hypothyroid    Current Outpatient Medications on File Prior to Visit  Medication Sig Dispense Refill   ALPRAZolam (XANAX) 0.5 MG tablet Take 0.25-0.5 mg by mouth 2 (two) times daily as needed for anxiety.     CALCIUM PO Take by mouth.     clobetasol ointment (TEMOVATE) [id number] % Apply 1 application topically as  needed.     Coenzyme Q10 (COQ10) 400 MG CAPS Take 1 capsule by mouth daily.     denosumab (PROLIA) 60 MG/ML SOLN injection Inject 60 mg into the skin every 6 (six) months. Administer in upper arm, thigh, or abdomen     diclofenac (VOLTAREN) 75 MG EC tablet Take 1 tablet (75 mg total) by mouth 2 (two) times daily. 60 tablet 1   levothyroxine (SYNTHROID, LEVOTHROID) 75 MCG tablet Take 75 mcg by mouth daily before breakfast.      minoxidil (LONITEN) 2.5 MG tablet      Omega-3 Fatty Acids (FISH OIL) 1200 MG CAPS Take 1 capsule by mouth daily.      predniSONE (DELTASONE) 10 MG tablet 6 tabs po day 1, 5 tabs po day 2, 4 tabs po day 3, 3 tabs po day 4, 2 tabs po day 5, 1 tab po day 6 21 tablet 0   simvastatin (ZOCOR) 5 MG tablet Take 5 mg by mouth daily.     VITAMIN D PO Take by mouth.     No current facility-administered medications on file prior to visit.    Past Surgical History:  Procedure Laterality Date   BLADDER SUSPENSION  02/2002   sparc   BREAST BIOPSY Right 1999   BREAST LUMPECTOMY Right 11/1997   MOUTH SURGERY     "dental implants"   NASAL SINUS SURGERY  12/2015  TOTAL KNEE ARTHROPLASTY Right 04/22/2017   Procedure: TOTAL KNEE ARTHROPLASTY;  Surgeon: Frederik Pear, MD;  Location: [facility];  Service: Orthopedics;  Laterality: Right;   TOTAL KNEE ARTHROPLASTY Left 04/2019   TUBAL LIGATION     VAGINAL HYSTERECTOMY  02/2002   LAVH-BSO    Allergies  Allergen Reactions   Gabapentin Other (See Comments)    Gave pt migraine    BP 128/84   Ht 5\' 5"  (1.651 m)   Wt 138 lb (62.6 kg)   LMP 03/05/1997   BMI 22.96 kg/m      01/23/2021    8:45 AM  [place] [facility]  Frequency of aerobic exercise (# of days/week) 4  Average time in minutes 30  Frequency of strengthening activities (# of days/week) 3        No data to display              Objective:  Physical Exam:  Gen: NAD, comfortable in exam room   Assessment & Plan:  1. Low back pain  -primarily due to her L4-5 severe facet arthropathy bilaterally with 12 mm left facet intraspinal synovial cyst.  These are leading to severe spinal stenosis.  After discussion of her options with Dr. Ronnald Ramp and his recommendation of a partial laminectomy with removal of the cyst, we also discussed risks and benefits of all 3 options.  I recommended that she proceed with his recommendation.  Other questions answered.  She will contact his office to schedule.  Total visit time 15 minutes including answering questions, discussion of elective surgery with risks and benefits, and documentation.  current exam and history reassuring.  Describes sciatica.  Tylenol, voltaren twice a day.  Start physical therapy and home exercises.  Consider steroid dose pack, MRI if not improving.  F/u in 6 weeks.

## 2022-06-05 ENCOUNTER — Other Ambulatory Visit: Payer: Self-pay | Admitting: Neurological Surgery

## 2022-06-07 NOTE — Pre-Procedure Instructions (Signed)
Surgical Instructions    Your procedure is scheduled on Monday 06/18/22.   Report to [facility] Main Entrance "A" at 12:55 P.M., then check in with the Admitting office.  Call this number if you have problems the morning of surgery:  [phone]   If you have any questions prior to your surgery date call [phone]: Open Monday-Friday 8am-4pm If you experience any cold or flu symptoms such as cough, fever, chills, shortness of breath, etc. between now and your scheduled surgery, please notify us at the above number     Remember:  Do not eat after midnight the night before your surgery  You may drink clear liquids until 11:55 A.M. the morning of your surgery.   Clear liquids allowed are: Water, Non-Citrus Juices (without pulp), Carbonated Beverages, Clear Tea, Black Coffee ONLY (NO MILK, CREAM OR POWDERED CREAMER of any kind), and Gatorade    Take these medicines the morning of surgery with A SIP OF WATER:   levothyroxine (SYNTHROID, LEVOTHROID)    ALPRAZolam Duanne Moron)- If needed  oxyCODONE-acetaminophen (PERCOCET)- If needed    As of today, STOP taking any Aspirin (unless otherwise instructed by your surgeon) Aleve, Naproxen, Ibuprofen, Motrin, Advil, Goody's, BC's, all herbal medications, fish oil, and all vitamins.           Do not wear jewelry or makeup. Do not wear lotions, powders, perfumes/cologne or deodorant. Do not shave 48 hours prior to surgery.  Men may shave face and neck. Do not bring valuables to the hospital. Do not wear nail polish, gel polish, artificial nails, or any other type of covering on natural nails (fingers and toes) If you have artificial nails or gel coating that need to be removed by a nail salon, please have this removed prior to surgery. Artificial nails or gel coating may interfere with anesthesia's ability to adequately monitor your vital signs.  [facility] is not responsible for any belongings or valuables.    Do NOT Smoke (Tobacco/Vaping)   24 hours prior to your procedure  If you use a CPAP at night, you may bring your mask for your overnight stay.   Contacts, glasses, hearing aids, dentures or partials may not be worn into surgery, please bring cases for these belongings   For patients admitted to the hospital, discharge time will be determined by your treatment team.   Patients discharged the day of surgery will not be allowed to drive home, and someone needs to stay with them for 24 hours.   SURGICAL WAITING ROOM VISITATION Patients having surgery or a procedure may have no more than 2 support people in the waiting area - these visitors may rotate.   Children under the age of 62 must have an adult with them who is not the patient. If the patient needs to stay at the hospital during part of their recovery, the visitor guidelines for inpatient rooms apply. Pre-op nurse will coordinate an appropriate time for 1 support person to accompany patient in pre-op.  This support person may not rotate.   Please refer to [email or web address] for the visitor guidelines for Inpatients (after your surgery is over and you are in a regular room).    Special instructions:    Oral Hygiene is also important to reduce your risk of infection.  Remember - BRUSH YOUR TEETH THE MORNING OF SURGERY WITH YOUR REGULAR TOOTHPASTE   [facility]- Preparing For Surgery  Before surgery, you can play an important role. Because skin is not sterile, your skin needs  to be as free of germs as possible. You can reduce the number of germs on your skin by washing with CHG (chlorahexidine gluconate) Soap before surgery.  CHG is an antiseptic cleaner which kills germs and bonds with the skin to continue killing germs even after washing.     Please do not use if you have an allergy to CHG or antibacterial soaps. If your skin becomes reddened/irritated stop using the CHG.  Do not shave (including legs and underarms)  for at least 48 hours prior to first CHG shower. It is OK to shave your face.  Please follow these instructions carefully.     Shower the NIGHT BEFORE SURGERY and the MORNING OF SURGERY with CHG Soap.   If you chose to wash your hair, wash your hair first as usual with your normal shampoo. After you shampoo, rinse your hair and body thoroughly to remove the shampoo.  Then [employer] and genitals (private parts) with your normal soap and rinse thoroughly to remove soap.  After that Use CHG Soap as you would any other liquid soap. You can apply CHG directly to the skin and wash gently with a scrungie or a clean washcloth.   Apply the CHG Soap to your body ONLY FROM THE NECK DOWN.  Do not use on open wounds or open sores. Avoid contact with your eyes, ears, mouth and genitals (private parts). Wash Face and genitals (private parts)  with your normal soap.   Wash thoroughly, paying special attention to the area where your surgery will be performed.  Thoroughly rinse your body with warm water from the neck down.  DO NOT shower/wash with your normal soap after using and rinsing off the CHG Soap.  Pat yourself dry with a CLEAN TOWEL.  Wear CLEAN PAJAMAS to bed the night before surgery  Place CLEAN SHEETS on your bed the night before your surgery  DO NOT SLEEP WITH PETS.   Day of Surgery:  Take a shower with CHG soap. Wear Clean/Comfortable clothing the morning of surgery Do not apply any deodorants/lotions.   Remember to brush your teeth WITH YOUR REGULAR TOOTHPASTE.    If you received a COVID test during your pre-op visit, it is requested that you wear a mask when out in public, stay away from anyone that may not be feeling well, and notify your surgeon if you develop symptoms. If you have been in contact with anyone that has tested positive in the last 10 days, please notify your surgeon.    Please read over the following fact sheets that you were given.

## 2022-06-08 ENCOUNTER — Other Ambulatory Visit: Payer: Self-pay

## 2022-06-08 ENCOUNTER — Encounter (HOSPITAL_COMMUNITY): Payer: Self-pay

## 2022-06-08 ENCOUNTER — Encounter (HOSPITAL_COMMUNITY)
Admission: RE | Admit: 2022-06-08 | Discharge: 2022-06-08 | Disposition: A | Discharge status: Home / Self Care | Payer: Medicare PPO | Source: Ambulatory Visit | Attending: Neurological Surgery | Admitting: Neurological Surgery

## 2022-06-08 VITALS — BP 137/72 | HR 72 | Temp 98.2°F | Resp 18 | Ht 65.0 in | Wt 136.9 lb

## 2022-06-08 DIAGNOSIS — Z01818 Encounter for other preprocedural examination: Secondary | ICD-10-CM | POA: Diagnosis not present

## 2022-06-08 HISTORY — DX: Other specified postprocedural states: Z98.890

## 2022-06-08 HISTORY — DX: Other specified postprocedural states: R11.2

## 2022-06-08 LAB — CBC
HCT: 37.7 % (ref 36.0–46.0)
Hemoglobin: 12.5 g/dL (ref 12.0–15.0)
MCH: 31.7 pg (ref 26.0–34.0)
MCHC: 33.2 g/dL (ref 30.0–36.0)
MCV: 95.7 fL (ref 80.0–100.0)
Platelets: 214 10*3/uL (ref 150–400)
RBC: 3.94 MIL/uL (ref 3.87–5.11)
RDW: 13.4 % (ref 11.5–15.5)
WBC: 4.4 10*3/uL (ref 4.0–10.5)
nRBC: 0 % (ref 0.0–0.2)

## 2022-06-08 LAB — SURGICAL PCR SCREEN
MRSA, PCR: NEGATIVE
Staphylococcus aureus: NEGATIVE

## 2022-06-08 NOTE — Progress Notes (Signed)
PCP - Lupita Raider Cardiologist - denies  PPM/ICD - denies   Chest x-ray - n/a EKG - n/a Stress Test - denies ECHO - denies Cardiac Cath - denies  Sleep Study - denies   ERAS Protcol -yes PRE-SURGERY Ensure or G2- none ordered  COVID TEST- not needed   Anesthesia review: no  Patient denies shortness of breath, fever, cough and chest pain at PAT appointment   All instructions explained to the patient, with a verbal understanding of the material. Patient agrees to go over the instructions while at home for a better understanding. Patient also instructed to self quarantine after being tested for COVID-19. The opportunity to ask questions was provided.

## 2022-06-14 MED ORDER — OXYCODONE-ACETAMINOPHEN 7.5-325 MG PO TABS: 1.0000 | ORAL_TABLET | Freq: Four times a day (QID) | ORAL | 0 refills | Status: DC | PRN

## 2022-06-19 NOTE — Progress Notes (Signed)
Spoke with pt to arrive tom at 0845, stop clear liquids at 0745.

## 2022-06-20 ENCOUNTER — Encounter (HOSPITAL_COMMUNITY): Payer: Self-pay | Admitting: Neurological Surgery

## 2022-06-20 ENCOUNTER — Other Ambulatory Visit: Payer: Self-pay

## 2022-06-20 ENCOUNTER — Ambulatory Visit (HOSPITAL_COMMUNITY): Payer: Medicare PPO

## 2022-06-20 ENCOUNTER — Ambulatory Visit (HOSPITAL_COMMUNITY): Payer: Medicare PPO | Admitting: Certified Registered"

## 2022-06-20 ENCOUNTER — Observation Stay (HOSPITAL_COMMUNITY)
Admission: RE | Admit: 2022-06-20 | Discharge: 2022-06-21 | Disposition: A | Discharge status: Home / Self Care | Payer: Medicare PPO | Attending: Neurological Surgery | Admitting: Neurological Surgery

## 2022-06-20 ENCOUNTER — Ambulatory Visit (HOSPITAL_BASED_OUTPATIENT_CLINIC_OR_DEPARTMENT_OTHER): Payer: Medicare PPO | Admitting: Certified Registered"

## 2022-06-20 ENCOUNTER — Encounter (HOSPITAL_COMMUNITY)
Admission: RE | Disposition: A | Discharge status: Home / Self Care | Payer: Self-pay | Source: Home / Self Care | Attending: Neurological Surgery

## 2022-06-20 DIAGNOSIS — Z96653 Presence of artificial knee joint, bilateral: Secondary | ICD-10-CM | POA: Diagnosis not present

## 2022-06-20 DIAGNOSIS — M4316 Spondylolisthesis, lumbar region: Secondary | ICD-10-CM

## 2022-06-20 DIAGNOSIS — E039 Hypothyroidism, unspecified: Secondary | ICD-10-CM | POA: Insufficient documentation

## 2022-06-20 DIAGNOSIS — Z853 Personal history of malignant neoplasm of breast: Secondary | ICD-10-CM | POA: Diagnosis not present

## 2022-06-20 DIAGNOSIS — M7138 Other bursal cyst, other site: Principal | ICD-10-CM | POA: Insufficient documentation

## 2022-06-20 DIAGNOSIS — M48061 Spinal stenosis, lumbar region without neurogenic claudication: Secondary | ICD-10-CM

## 2022-06-20 DIAGNOSIS — Z9889 Other specified postprocedural states: Secondary | ICD-10-CM

## 2022-06-20 DIAGNOSIS — E7800 Pure hypercholesterolemia, unspecified: Secondary | ICD-10-CM | POA: Insufficient documentation

## 2022-06-20 DIAGNOSIS — M5416 Radiculopathy, lumbar region: Secondary | ICD-10-CM | POA: Insufficient documentation

## 2022-06-20 DIAGNOSIS — Z79899 Other long term (current) drug therapy: Secondary | ICD-10-CM | POA: Diagnosis not present

## 2022-06-20 HISTORY — PX: LUMBAR LAMINECTOMY/DECOMPRESSION MICRODISCECTOMY: SHX5026

## 2022-06-20 LAB — PROTIME-INR
INR: 1.9 — ABNORMAL HIGH (ref 0.8–1.2)
Prothrombin Time: 21.7 seconds — ABNORMAL HIGH (ref 11.4–15.2)

## 2022-06-20 SURGERY — LUMBAR LAMINECTOMY/DECOMPRESSION MICRODISCECTOMY 1 LEVEL
Anesthesia: General | Site: Back | Laterality: Left

## 2022-06-20 MED ORDER — CHLORHEXIDINE GLUCONATE CLOTH 2 % EX PADS: 6.0000 | MEDICATED_PAD | Freq: Once | CUTANEOUS | Status: DC

## 2022-06-20 MED ORDER — THROMBIN 5000 UNITS EX SOLR
CUTANEOUS | Status: AC
  Filled 2022-06-20: qty 5000

## 2022-06-20 MED ORDER — OXYCODONE HCL 5 MG PO TABS
ORAL_TABLET | ORAL | Status: AC
  Filled 2022-06-20: qty 1

## 2022-06-20 MED ORDER — HYDROMORPHONE HCL 1 MG/ML IJ SOLN
0.2500 mg | INTRAMUSCULAR | Status: DC | PRN
  Administered 2022-06-20: 0.5 mg via INTRAVENOUS
  Administered 2022-06-20: 0.25 mg via INTRAVENOUS
  Administered 2022-06-20: 0.5 mg via INTRAVENOUS
  Administered 2022-06-20: 0.25 mg via INTRAVENOUS

## 2022-06-20 MED ORDER — ACETAMINOPHEN 10 MG/ML IV SOLN
INTRAVENOUS | Status: AC
  Filled 2022-06-20: qty 100

## 2022-06-20 MED ORDER — PROPOFOL 10 MG/ML IV BOLUS
INTRAVENOUS | Status: AC
  Filled 2022-06-20: qty 20

## 2022-06-20 MED ORDER — LACTATED RINGERS IV SOLN: INTRAVENOUS | Status: DC

## 2022-06-20 MED ORDER — PROPOFOL 10 MG/ML IV BOLUS
INTRAVENOUS | Status: DC | PRN
  Administered 2022-06-20: 110 mg via INTRAVENOUS

## 2022-06-20 MED ORDER — FENTANYL CITRATE (PF) 250 MCG/5ML IJ SOLN
INTRAMUSCULAR | Status: AC
  Filled 2022-06-20: qty 5

## 2022-06-20 MED ORDER — HYDROMORPHONE HCL 1 MG/ML IJ SOLN: 0.2500 mg | INTRAMUSCULAR | Status: DC | PRN

## 2022-06-20 MED ORDER — KETOROLAC TROMETHAMINE 30 MG/ML IJ SOLN
INTRAMUSCULAR | Status: DC | PRN
  Administered 2022-06-20: 15 mg via INTRAVENOUS

## 2022-06-20 MED ORDER — CEFAZOLIN SODIUM-DEXTROSE 2-4 GM/100ML-% IV SOLN
2.0000 g | Freq: Three times a day (TID) | INTRAVENOUS | Status: AC
  Administered 2022-06-20 – 2022-06-21 (×2): 2 g via INTRAVENOUS
  Filled 2022-06-20 (×2): qty 100

## 2022-06-20 MED ORDER — BUPIVACAINE HCL (PF) 0.25 % IJ SOLN
INTRAMUSCULAR | Status: AC
  Filled 2022-06-20: qty 30

## 2022-06-20 MED ORDER — BUPIVACAINE HCL (PF) 0.25 % IJ SOLN
INTRAMUSCULAR | Status: DC | PRN
  Administered 2022-06-20: 6 mL
  Administered 2022-06-20: 10 mL

## 2022-06-20 MED ORDER — LIDOCAINE 2% (20 MG/ML) 5 ML SYRINGE
INTRAMUSCULAR | Status: AC
  Filled 2022-06-20: qty 5

## 2022-06-20 MED ORDER — ROCURONIUM BROMIDE 10 MG/ML (PF) SYRINGE
PREFILLED_SYRINGE | INTRAVENOUS | Status: AC
  Filled 2022-06-20: qty 10

## 2022-06-20 MED ORDER — CELECOXIB 200 MG PO CAPS
200.0000 mg | ORAL_CAPSULE | Freq: Two times a day (BID) | ORAL | Status: DC
  Administered 2022-06-20 (×2): 200 mg via ORAL
  Filled 2022-06-20 (×2): qty 1

## 2022-06-20 MED ORDER — POTASSIUM CHLORIDE IN NACL 20-0.9 MEQ/L-% IV SOLN: INTRAVENOUS | Status: DC

## 2022-06-20 MED ORDER — CHLORHEXIDINE GLUCONATE 0.12 % MT SOLN
15.0000 mL | Freq: Once | OROMUCOSAL | Status: AC
  Administered 2022-06-20: 15 mL via OROMUCOSAL
  Filled 2022-06-20: qty 15

## 2022-06-20 MED ORDER — DEXAMETHASONE SODIUM PHOSPHATE 10 MG/ML IJ SOLN
INTRAMUSCULAR | Status: DC | PRN
  Administered 2022-06-20: 10 mg via INTRAVENOUS

## 2022-06-20 MED ORDER — PHENYLEPHRINE HCL-NACL 20-0.9 MG/250ML-% IV SOLN
INTRAVENOUS | Status: DC | PRN
  Administered 2022-06-20: 30 ug/min via INTRAVENOUS

## 2022-06-20 MED ORDER — THROMBIN 5000 UNITS EX SOLR
OROMUCOSAL | Status: DC | PRN
  Administered 2022-06-20: 5 mL via TOPICAL

## 2022-06-20 MED ORDER — HYDROMORPHONE HCL 1 MG/ML IJ SOLN
INTRAMUSCULAR | Status: AC
  Administered 2022-06-20: 0.25 mg
  Filled 2022-06-20: qty 1

## 2022-06-20 MED ORDER — ACETAMINOPHEN 650 MG RE SUPP: 650.0000 mg | RECTAL | Status: DC | PRN

## 2022-06-20 MED ORDER — OXYCODONE-ACETAMINOPHEN 7.5-325 MG PO TABS: 1.0000 | ORAL_TABLET | ORAL | 0 refills | Status: DC | PRN

## 2022-06-20 MED ORDER — SENNA 8.6 MG PO TABS
1.0000 | ORAL_TABLET | Freq: Two times a day (BID) | ORAL | Status: DC
  Administered 2022-06-20 (×2): 8.6 mg via ORAL
  Filled 2022-06-20 (×2): qty 1

## 2022-06-20 MED ORDER — DEXAMETHASONE 4 MG PO TABS
4.0000 mg | ORAL_TABLET | Freq: Four times a day (QID) | ORAL | Status: DC
  Administered 2022-06-20 – 2022-06-21 (×3): 4 mg via ORAL
  Filled 2022-06-20 (×3): qty 1

## 2022-06-20 MED ORDER — PHENYLEPHRINE 80 MCG/ML (10ML) SYRINGE FOR IV PUSH (FOR BLOOD PRESSURE SUPPORT)
PREFILLED_SYRINGE | INTRAVENOUS | Status: DC | PRN
  Administered 2022-06-20: 80 ug via INTRAVENOUS

## 2022-06-20 MED ORDER — OXYCODONE-ACETAMINOPHEN 7.5-325 MG PO TABS
1.0000 | ORAL_TABLET | Freq: Four times a day (QID) | ORAL | Status: DC | PRN
  Administered 2022-06-20 – 2022-06-21 (×3): 1 via ORAL
  Filled 2022-06-20 (×4): qty 1

## 2022-06-20 MED ORDER — SODIUM CHLORIDE 0.9 % IV SOLN
250.0000 mL | INTRAVENOUS | Status: DC
  Administered 2022-06-20: 250 mL via INTRAVENOUS

## 2022-06-20 MED ORDER — MENTHOL 3 MG MT LOZG: 1.0000 | LOZENGE | OROMUCOSAL | Status: DC | PRN

## 2022-06-20 MED ORDER — MORPHINE SULFATE (PF) 2 MG/ML IV SOLN
2.0000 mg | INTRAVENOUS | Status: DC | PRN
  Administered 2022-06-20: 2 mg via INTRAVENOUS
  Filled 2022-06-20: qty 1

## 2022-06-20 MED ORDER — MINOXIDIL 2.5 MG PO TABS
1.2500 mg | ORAL_TABLET | Freq: Every day | ORAL | Status: DC
  Administered 2022-06-20: 1.25 mg via ORAL
  Filled 2022-06-20 (×2): qty 0.5

## 2022-06-20 MED ORDER — HYDROMORPHONE HCL 1 MG/ML IJ SOLN
INTRAMUSCULAR | Status: AC
  Filled 2022-06-20: qty 1

## 2022-06-20 MED ORDER — DEXAMETHASONE SODIUM PHOSPHATE 4 MG/ML IJ SOLN: 4.0000 mg | Freq: Four times a day (QID) | INTRAMUSCULAR | Status: DC

## 2022-06-20 MED ORDER — CEFAZOLIN SODIUM-DEXTROSE 2-4 GM/100ML-% IV SOLN
2.0000 g | INTRAVENOUS | Status: AC
  Administered 2022-06-20: 2 g via INTRAVENOUS
  Filled 2022-06-20: qty 100

## 2022-06-20 MED ORDER — ONDANSETRON HCL 4 MG PO TABS: 4.0000 mg | ORAL_TABLET | Freq: Four times a day (QID) | ORAL | Status: DC | PRN

## 2022-06-20 MED ORDER — PHENOL 1.4 % MT LIQD: 1.0000 | OROMUCOSAL | Status: DC | PRN

## 2022-06-20 MED ORDER — ACETAMINOPHEN 325 MG PO TABS: 650.0000 mg | ORAL_TABLET | ORAL | Status: DC | PRN

## 2022-06-20 MED ORDER — ROCURONIUM BROMIDE 10 MG/ML (PF) SYRINGE
PREFILLED_SYRINGE | INTRAVENOUS | Status: DC | PRN
  Administered 2022-06-20: 20 mg via INTRAVENOUS
  Administered 2022-06-20: 50 mg via INTRAVENOUS

## 2022-06-20 MED ORDER — LEVOTHYROXINE SODIUM 75 MCG PO TABS
75.0000 ug | ORAL_TABLET | Freq: Every day | ORAL | Status: DC
  Administered 2022-06-21: 75 ug via ORAL
  Filled 2022-06-20: qty 1

## 2022-06-20 MED ORDER — DEXAMETHASONE SODIUM PHOSPHATE 10 MG/ML IJ SOLN
INTRAMUSCULAR | Status: AC
  Filled 2022-06-20: qty 1

## 2022-06-20 MED ORDER — LIDOCAINE 2% (20 MG/ML) 5 ML SYRINGE
INTRAMUSCULAR | Status: DC | PRN
  Administered 2022-06-20: 60 mg via INTRAVENOUS

## 2022-06-20 MED ORDER — 0.9 % SODIUM CHLORIDE (POUR BTL) OPTIME
TOPICAL | Status: DC | PRN
  Administered 2022-06-20: 1000 mL

## 2022-06-20 MED ORDER — ACETAMINOPHEN 10 MG/ML IV SOLN
INTRAVENOUS | Status: DC | PRN
  Administered 2022-06-20: 900 mg via INTRAVENOUS

## 2022-06-20 MED ORDER — SUGAMMADEX SODIUM 200 MG/2ML IV SOLN
INTRAVENOUS | Status: DC | PRN
  Administered 2022-06-20: 60 mg via INTRAVENOUS
  Administered 2022-06-20: 120 mg via INTRAVENOUS

## 2022-06-20 MED ORDER — MIDAZOLAM HCL 2 MG/2ML IJ SOLN
INTRAMUSCULAR | Status: AC
  Filled 2022-06-20: qty 2

## 2022-06-20 MED ORDER — METHOCARBAMOL 500 MG PO TABS
500.0000 mg | ORAL_TABLET | Freq: Four times a day (QID) | ORAL | Status: DC | PRN
  Administered 2022-06-20 – 2022-06-21 (×3): 500 mg via ORAL
  Filled 2022-06-20 (×3): qty 1

## 2022-06-20 MED ORDER — ONDANSETRON HCL 4 MG/2ML IJ SOLN
INTRAMUSCULAR | Status: AC
  Filled 2022-06-20: qty 2

## 2022-06-20 MED ORDER — ONDANSETRON HCL 4 MG/2ML IJ SOLN
INTRAMUSCULAR | Status: DC | PRN
  Administered 2022-06-20: 4 mg via INTRAVENOUS

## 2022-06-20 MED ORDER — OXYCODONE HCL 5 MG PO TABS
10.0000 mg | ORAL_TABLET | Freq: Once | ORAL | Status: AC
  Administered 2022-06-20: 10 mg via ORAL

## 2022-06-20 MED ORDER — METHOCARBAMOL 1000 MG/10ML IJ SOLN: 500.0000 mg | Freq: Four times a day (QID) | INTRAVENOUS | Status: DC | PRN

## 2022-06-20 MED ORDER — SUGAMMADEX SODIUM 200 MG/2ML IV SOLN: INTRAVENOUS | Status: DC | PRN

## 2022-06-20 MED ORDER — FENTANYL CITRATE (PF) 250 MCG/5ML IJ SOLN
INTRAMUSCULAR | Status: DC | PRN
  Administered 2022-06-20: 50 ug via INTRAVENOUS

## 2022-06-20 MED ORDER — ONDANSETRON HCL 4 MG/2ML IJ SOLN: 4.0000 mg | Freq: Four times a day (QID) | INTRAMUSCULAR | Status: DC | PRN

## 2022-06-20 MED ORDER — ORAL CARE MOUTH RINSE: 15.0000 mL | Freq: Once | OROMUCOSAL | Status: AC

## 2022-06-20 MED ORDER — PROPOFOL 1000 MG/100ML IV EMUL
INTRAVENOUS | Status: AC
  Filled 2022-06-20: qty 100

## 2022-06-20 MED ORDER — SODIUM CHLORIDE 0.9% FLUSH
3.0000 mL | Freq: Two times a day (BID) | INTRAVENOUS | Status: DC
  Administered 2022-06-20: 3 mL via INTRAVENOUS

## 2022-06-20 MED ORDER — ALPRAZOLAM 0.25 MG PO TABS
0.2500 mg | ORAL_TABLET | Freq: Two times a day (BID) | ORAL | Status: DC | PRN
  Administered 2022-06-20: 0.5 mg via ORAL
  Filled 2022-06-20: qty 2

## 2022-06-20 MED ORDER — SODIUM CHLORIDE 0.9% FLUSH: 3.0000 mL | INTRAVENOUS | Status: DC | PRN

## 2022-06-20 SURGICAL SUPPLY — 46 items
ADH SKN CLS APL DERMABOND .7 (GAUZE/BANDAGES/DRESSINGS) ×1
APL SKNCLS STERI-STRIP NONHPOA (GAUZE/BANDAGES/DRESSINGS) ×1
BAG COUNTER SPONGE SURGICOUNT (BAG) ×1 IMPLANT
BAG SPNG CNTER NS LX DISP (BAG) ×2
BAND INSRT 18 STRL LF DISP RB (MISCELLANEOUS) ×2
BAND RUBBER #18 3X1/16 STRL (MISCELLANEOUS) ×2 IMPLANT
BENZOIN TINCTURE PRP APPL 2/3 (GAUZE/BANDAGES/DRESSINGS) ×1 IMPLANT
BUR CARBIDE MATCH 3.0 (BURR) ×1 IMPLANT
CANISTER SUCT 3000ML PPV (MISCELLANEOUS) ×1 IMPLANT
DERMABOND ADVANCED .7 DNX12 (GAUZE/BANDAGES/DRESSINGS) IMPLANT
DRAPE LAPAROTOMY 100X72X124 (DRAPES) ×1 IMPLANT
DRAPE MICROSCOPE SLANT 54X150 (MISCELLANEOUS) ×1 IMPLANT
DRAPE SURG 17X23 STRL (DRAPES) ×1 IMPLANT
DRSG OPSITE POSTOP 3X4 (GAUZE/BANDAGES/DRESSINGS) IMPLANT
DURAPREP 26ML APPLICATOR (WOUND CARE) ×1 IMPLANT
ELECT REM PT RETURN 9FT ADLT (ELECTROSURGICAL) ×1
ELECTRODE REM PT RTRN 9FT ADLT (ELECTROSURGICAL) ×1 IMPLANT
GAUZE 4X4 16PLY ~~LOC~~+RFID DBL (SPONGE) IMPLANT
GLOVE BIO SURGEON STRL SZ7 (GLOVE) IMPLANT
GLOVE BIO SURGEON STRL SZ8 (GLOVE) ×1 IMPLANT
GLOVE BIOGEL PI IND STRL 7.0 (GLOVE) IMPLANT
GOWN STRL REUS W/ TWL LRG LVL3 (GOWN DISPOSABLE) IMPLANT
GOWN STRL REUS W/ TWL XL LVL3 (GOWN DISPOSABLE) ×1 IMPLANT
GOWN STRL REUS W/TWL 2XL LVL3 (GOWN DISPOSABLE) IMPLANT
GOWN STRL REUS W/TWL LRG LVL3 (GOWN DISPOSABLE)
GOWN STRL REUS W/TWL XL LVL3 (GOWN DISPOSABLE) ×1
HEMOSTAT POWDER KIT SURGIFOAM (HEMOSTASIS) ×1 IMPLANT
KIT BASIN OR (CUSTOM PROCEDURE TRAY) ×1 IMPLANT
KIT TURNOVER KIT B (KITS) ×1 IMPLANT
NDL HYPO 25X1 1.5 SAFETY (NEEDLE) ×1 IMPLANT
NDL SPNL 20GX3.5 QUINCKE YW (NEEDLE) IMPLANT
NEEDLE HYPO 25X1 1.5 SAFETY (NEEDLE) ×1 IMPLANT
NEEDLE SPNL 20GX3.5 QUINCKE YW (NEEDLE) IMPLANT
NS IRRIG 1000ML POUR BTL (IV SOLUTION) ×1 IMPLANT
PACK LAMINECTOMY NEURO (CUSTOM PROCEDURE TRAY) ×1 IMPLANT
PAD ARMBOARD 7.5X6 YLW CONV (MISCELLANEOUS) ×3 IMPLANT
SOL ELECTROSURG ANTI STICK (MISCELLANEOUS) ×1
SOLUTION ELECTROSURG ANTI STCK (MISCELLANEOUS) ×1 IMPLANT
STRIP CLOSURE SKIN 1/2X4 (GAUZE/BANDAGES/DRESSINGS) ×1 IMPLANT
SUT VIC AB 0 CT1 18XCR BRD8 (SUTURE) ×1 IMPLANT
SUT VIC AB 0 CT1 8-18 (SUTURE) ×1
SUT VIC AB 2-0 CP2 18 (SUTURE) ×1 IMPLANT
SUT VIC AB 3-0 SH 8-18 (SUTURE) ×1 IMPLANT
TOWEL GREEN STERILE (TOWEL DISPOSABLE) ×1 IMPLANT
TOWEL GREEN STERILE FF (TOWEL DISPOSABLE) ×1 IMPLANT
WATER STERILE IRR 1000ML POUR (IV SOLUTION) ×1 IMPLANT

## 2022-06-20 NOTE — Anesthesia Preprocedure Evaluation (Signed)
Anesthesia Evaluation  Patient identified by MRN, date of birth, ID band Patient awake    Reviewed: Allergy & Precautions, H&P , NPO status , Patient's Chart, lab work & pertinent test results  History of Anesthesia Complications (+) PONV and history of anesthetic complications  Airway Mallampati: III  TM Distance: >3 FB Neck ROM: Full    Dental no notable dental hx. (+) Teeth Intact, Dental Advisory Given   Pulmonary neg pulmonary ROS   Pulmonary exam normal breath sounds clear to auscultation       Cardiovascular negative cardio ROS  Rhythm:Regular Rate:Normal     Neuro/Psych negative neurological ROS  negative psych ROS   GI/Hepatic negative GI ROS, Neg liver ROS,,,  Endo/Other  Hypothyroidism    Renal/GU negative Renal ROS  negative genitourinary   Musculoskeletal  (+) Arthritis ,    Abdominal   Peds  Hematology negative hematology ROS (+)   Anesthesia Other Findings   Reproductive/Obstetrics negative OB ROS                             Anesthesia Physical Anesthesia Plan  ASA: 2  Anesthesia Plan: General   Post-op Pain Management: Ofirmev IV (intra-op)* and Toradol IV (intra-op)*   Induction: Intravenous  PONV Risk Score and Plan: 4 or greater and Ondansetron, Dexamethasone, Treatment may vary due to age or medical condition and Propofol infusion  Airway Management Planned: Oral ETT  Additional Equipment:   Intra-op Plan:   Post-operative Plan: Extubation in OR  Informed Consent: I have reviewed the patients History and Physical, chart, labs and discussed the procedure including the risks, benefits and alternatives for the proposed anesthesia with the patient or authorized representative who has indicated his/her understanding and acceptance.     Dental advisory given  Plan Discussed with: CRNA  Anesthesia Plan Comments:        Anesthesia Quick  Evaluation

## 2022-06-20 NOTE — Anesthesia Procedure Notes (Signed)
Procedure Name: Intubation Date/Time: 06/20/2022 11:16 AM  Performed by: Alwyn Ren, CRNAPre-anesthesia Checklist: Patient identified, Emergency Drugs available, Suction available and Patient being monitored Patient Re-evaluated:Patient Re-evaluated prior to induction Oxygen Delivery Method: Circle system utilized Preoxygenation: Pre-oxygenation with 100% oxygen Induction Type: IV induction Ventilation: Mask ventilation without difficulty Laryngoscope Size: Miller and 2 Grade View: Grade II Tube type: Oral Tube size: 7.0 mm Number of attempts: 2 Airway Equipment and Method: Stylet and Oral airway Placement Confirmation: ETT inserted through vocal cords under direct vision, positive ETCO2 and breath sounds checked- equal and bilateral Secured at: 21 cm Tube secured with: Tape Dental Injury: Teeth and Oropharynx as per pre-operative assessment  Comments: Slight anterior view-2b. Intubated with Hyacinth Meeker 2 on second attempt by Dr. Sampson Goon.

## 2022-06-20 NOTE — H&P (Signed)
Subjective: Patient is a 72 y.o. female admitted for left leg pain. Onset of symptoms was several months ago, gradually worsening since that time.  The pain is rated severe, and is located at the across the lower back and radiates to left lower extremity. The pain is described as aching and occurs all day. The symptoms have been progressive. Symptoms are exacerbated by exercise, extension, standing, and walking for more than a few minutes. MRI or CT showed synovial cyst L4-5 left compressing the left L5 nerve root  Past Medical History:  Diagnosis Date   Anal fissure    Breast cancer, right breast 11/1997   S/P lumphectomy, chemo,  and radiation therapy   Broken arm 08/2014   right   Hypercholesteremia    Hypothyroidism    Osteoarthritis    "knees; maybe in my lower back" (04/23/2017)   Personal history of chemotherapy    Personal history of radiation therapy    PONV (postoperative nausea and vomiting)    Thyroid disease    hypothyroid    Past Surgical History:  Procedure Laterality Date   BLADDER SUSPENSION  02/2002   sparc   BREAST BIOPSY Right 1999   BREAST LUMPECTOMY Right 11/1997   EYE SURGERY Bilateral    bilateral cataract removal   MOUTH SURGERY     "dental implants"   NASAL SINUS SURGERY  12/2015   TOTAL KNEE ARTHROPLASTY Right 04/22/2017   Procedure: TOTAL KNEE ARTHROPLASTY;  Surgeon: Gean Birchwood, MD;  Location: [facility] OR;  Service: Orthopedics;  Laterality: Right;   TOTAL KNEE ARTHROPLASTY Left 04/2019   TUBAL LIGATION     VAGINAL HYSTERECTOMY  02/2002   LAVH-BSO    Prior to Admission medications   Medication Sig Start Date End Date Taking? Authorizing Provider  ALPRAZolam Prudy Feeler) 0.5 MG tablet Take 0.25-0.5 mg by mouth 2 (two) times daily as needed for anxiety.   Yes [provider]  Calcium Carbonate-Vit D-Min (CALTRATE MINIS PLUS MINERALS PO) Take 1 tablet by mouth in the morning and at bedtime.   Yes [provider]  cholecalciferol (VITAMIN D3)  25 MCG (1000 UNIT) tablet Take 1,000 Units by mouth daily.   Yes [provider]  clobetasol ointment (TEMOVATE) 0.05 % Apply 1 application  topically daily as needed (irritation). 07/28/19  Yes [provider]  Coenzyme Q10 (COQ10) 400 MG CAPS Take 400 mg by mouth daily.   Yes [provider]  levothyroxine (SYNTHROID, LEVOTHROID) 75 MCG tablet Take 75 mcg by mouth daily before breakfast.  05/08/14  Yes [provider]  minoxidil (LONITEN) 2.5 MG tablet Take 1.25 mg by mouth daily. 07/09/19  Yes [provider]  Omega-3 Fatty Acids (FISH OIL ULTRA) 1400 MG CAPS Take 1,400 mg by mouth daily.   Yes [provider]  oxyCODONE-acetaminophen (PERCOCET) 7.5-325 MG tablet Take 1 tablet by mouth every 6 (six) hours as needed for severe pain. 06/14/22  Yes Hudnall, Azucena Fallen, MD  simvastatin (ZOCOR) 20 MG tablet Take 20 mg by mouth at bedtime.   Yes [provider]  denosumab (PROLIA) 60 MG/ML SOLN injection Inject 60 mg into the skin every 6 (six) months. Administer in upper arm, thigh, or abdomen    [provider]   Allergies  Allergen Reactions   Gabapentin Other (See Comments)    Gave pt migraine    Social History   Tobacco Use   Smoking status: Never   Smokeless tobacco: Never  Substance Use Topics   Alcohol use:  Yes    Alcohol/week: 6.0 - 8.0 standard drinks of alcohol    Types: 6 - 8 Standard drinks or equivalent per week    Comment: 2 wine/day    Family History  Problem Relation Age of Onset   Rheum arthritis Mother 80   Leukemia Mother    Non-Hodgkin's lymphoma Sister    Other Father 108       accident     Review of Systems  Positive ROS: neg  All other systems have been reviewed and were otherwise negative with the exception of those mentioned in the HPI and as above.  Objective: Vital signs in last 24 hours: Temp:  [98.8 F (37.1 C)] 98.8 F (37.1 C) (04/17 0916) Pulse Rate:  [58] 58 (04/17 0916) Resp:   [18] 18 (04/17 0916) BP: (145)/(68) 145/68 (04/17 0916) SpO2:  [97 %] 97 % (04/17 0916) Weight:  [60.3 kg] 60.3 kg (04/17 0912)  General Appearance: Alert, cooperative, no distress, appears stated age Head: Normocephalic, without obvious abnormality, atraumatic Eyes: PERRL, conjunctiva/corneas clear, EOM's intact    Neck: Supple, symmetrical, trachea midline Back: Symmetric, no curvature, ROM normal, no CVA tenderness Lungs:  respirations unlabored Heart: Regular rate and rhythm Abdomen: Soft, non-tender Extremities: Extremities normal, atraumatic, no cyanosis or edema Pulses: 2+ and symmetric all extremities Skin: Skin color, texture, turgor normal, no rashes or lesions  NEUROLOGIC:   Mental status: Alert and oriented x4,  no aphasia, good attention span, fund of knowledge, and memory Motor Exam - grossly normal Sensory Exam - grossly normal Reflexes: 1+ Coordination - grossly normal Gait - grossly normal Balance - grossly normal Cranial Nerves: I: smell Not tested  II: visual acuity  OS: nl    OD: nl  II: visual fields Full to confrontation  II: pupils Equal, round, reactive to light  III,VII: ptosis None  III,IV,VI: extraocular muscles  Full ROM  V: mastication Normal  V: facial light touch sensation  Normal  V,VII: corneal reflex  Present  VII: facial muscle function - upper  Normal  VII: facial muscle function - lower Normal  VIII: hearing Not tested  IX: soft palate elevation  Normal  IX,X: gag reflex Present  XI: trapezius strength  5/5  XI: sternocleidomastoid strength 5/5  XI: neck flexion strength  5/5  XII: tongue strength  Normal    Data Review Lab Results  Component Value Date   WBC 4.4 06/08/2022   HGB 12.5 06/08/2022   HCT 37.7 06/08/2022   MCV 95.7 06/08/2022   PLT 214 06/08/2022   Lab Results  Component Value Date   NA 140 04/23/2017   K 3.9 04/23/2017   CL 105 04/23/2017   CO2 24 04/23/2017   BUN 11 04/23/2017   CREATININE 0.79  04/23/2017   GLUCOSE 109 (H) 04/23/2017   Lab Results  Component Value Date   INR 1.9 (H) 06/20/2022    Assessment/Plan:  Estimated body mass index is 22.13 kg/m as calculated from the following:   Height as of this encounter:  (1.651 m).   Weight as of this encounter: 60.3 kg. Patient admitted for L4-5 hemilaminectomy with resection of synovial cyst. Patient has failed a reasonable attempt at conservative therapy.  I explained the condition and procedure to the patient and answered any questions.  Patient wishes to proceed with procedure as planned. Understands risks/ benefits and typical outcomes of procedure.   Tia Alert 06/20/2022 10:30 AM

## 2022-06-20 NOTE — Op Note (Signed)
06/20/2022  12:36 PM  PATIENT:  [name]  72 y.o. female  PRE-OPERATIVE DIAGNOSIS: L4-5 lumbar spinal stenosis with slight anterolisthesis, left L4-5 synovial cyst compressing the left L5 nerve root with left L5 radiculopathy  POST-OPERATIVE DIAGNOSIS:  same  PROCEDURE: Left L4-5 hemilaminectomy, medial facetectomy foraminotomies with resection of synovial cyst followed by sublaminar decompression for central canal and right lateral recess decompression  SURGEON:  Marikay Alar, MD  ASSISTANTSAdelene Idler, FNP  ANESTHESIA:   General  EBL: 5 ml  Total I/O In: 600 [I.V.:600] Out: 5 [Blood:5]  BLOOD ADMINISTERED: none  DRAINS: none  SPECIMEN:  none  INDICATION FOR PROCEDURE: This patient presented with L leg pain. Imaging showed severe spinal stenosis at L4-5 with a slight anterior listhesis that was stable through flexion extension, there was a left-sided synovial cyst compressing the left L5 nerve root.. The patient tried conservative measures without relief. Pain was debilitating. Recommended L4-5 decompressive laminectomy and resection of the synovial cyst. Patient understood the risks, benefits, and alternatives and potential outcomes and wished to proceed.  PROCEDURE DETAILS: The patient was taken to the operating room and after induction of adequate generalized endotracheal anesthesia, the patient was rolled into the prone position on the Wilson frame and all pressure points were padded. The lumbar region was cleaned and then prepped with DuraPrep and draped in the usual sterile fashion. 5 cc of local anesthesia was injected and then a dorsal midline incision was made and carried down to the lumbo sacral fascia. The fascia was opened and the paraspinous musculature was taken down in a subperiosteal fashion to expose L4-5 on the left. Intraoperative x-ray confirmed my level, and then I used a combination of the high-speed drill and the Kerrison punches to perform a hemilaminectomy,  medial facetectomy, and foraminotomy at L4 5 on the left. The underlying yellow ligament was opened and removed in a piecemeal fashion to expose the underlying synovial cyst.   I used a Statistician to dissect between the synovial cyst and the dura and then undercut the synovial cyst to remove it from the lateral recess, decompressing the dura and exiting nerve root. I undercut the lateral recess and dissected down until I was medial to and distal to the pedicle. The nerve root was well decompressed.  I then drilled up under the spinous process and performed a sublaminar decompression by tilting of the retractor toward me and undercutting the opposite lamina and lateral recess.   I then palpated with a coronary dilator along the nerve root and into the foramen to assure adequate decompression. I felt no more compression of the nerve root. I irrigated with saline solution containing bacitracin. Achieved hemostasis with bipolar cautery, and then closed the fascia with 0 Vicryl. I closed the subcutaneous tissues with 2-0 Vicryl and the subcuticular tissues with 3-0 Vicryl. The skin was then closed with benzoin and Steri-Strips. The drapes were removed, a sterile dressing was applied.  My nurse practitioner was involved in the exposure, safe retraction of the neural elements, the disc work and the closure. the patient was awakened from general anesthesia and transferred to the recovery room in stable condition. At the end of the procedure all sponge, needle and instrument counts were correct.    PLAN OF CARE: Admit for overnight observation  PATIENT DISPOSITION:  PACU - hemodynamically stable.   Delay start of Pharmacological VTE agent (>24hrs) due to surgical blood loss or risk of bleeding:  yes

## 2022-06-20 NOTE — Discharge Summary (Signed)
Physician Discharge Summary  Patient ID: [name] MRN: [id number] DOB/AGE: 74-Feb-1952 74 y.o.  Admit date: 06/20/2022 Discharge date: 06/20/2022  Admission Diagnoses:   L4-5 lumbar spinal stenosis with slight anterolisthesis, left L4-5 synovial cyst compressing the left L5 nerve root with left L5 radiculopathy     Discharge Diagnoses: same   Discharged Condition: good  Hospital Course: The patient was admitted on 06/20/2022 and taken to the operating room where the patient underwent left L4-5 laminectomy . The patient tolerated the procedure well and was taken to the recovery room and then to the floor in stable condition. The hospital course was routine. There were no complications. The wound remained clean dry and intact. Pt had appropriate back soreness. No complaints of leg pain or new N/T/W. The patient remained afebrile with stable vital signs, and tolerated a regular diet. The patient continued to increase activities, and pain was well controlled with oral pain medications.   Consults: None  Significant Diagnostic Studies:  Results for orders placed or performed during the hospital encounter of 06/20/22  Protime-INR  Result Value Ref Range   Prothrombin Time 21.7 (H) 11.4 - 15.2 seconds   INR 1.9 (H) 0.8 - 1.2    DG Lumbar Spine 2-3 Views  Result Date: 06/20/2022 CLINICAL DATA:  Intraoperative localization EXAM: LUMBAR SPINE - 2-3 VIEW COMPARISON:  02/06/2021 FINDINGS: Two cross-table images depicting instrumentation posterior to the L4-L5 disc level (image 1). On the second image, instrumentation is posterior to the L4 vertebral body. IMPRESSION: Intraoperative localization for L4-L5 surgery. Electronically Signed   By: Wiliam Ke M.D.   On: 06/20/2022 12:39    Antibiotics:  Anti-infectives (From admission, onward)    Start     Dose/Rate Route Frequency Ordered Stop   06/20/22 2000  ceFAZolin (ANCEF) IVPB 2g/100 mL premix        2 g 200 mL/hr over 30 Minutes  Intravenous Every 8 hours 06/20/22 1512 06/21/22 1159   06/20/22 0900  ceFAZolin (ANCEF) IVPB 2g/100 mL premix        2 g 200 mL/hr over 30 Minutes Intravenous On call to O.R. 06/20/22 0852 06/20/22 1136       Discharge Exam: Blood pressure (!) 186/90, pulse (!) 52, temperature 97.9 F (36.6 C), resp. rate 20, height  (1.651 m), weight 60.3 kg, last menstrual period 03/05/1997, SpO2 100 %. Neurologic: Grossly normal Ambulating and voiding well incision cdi   Discharge Medications:   Allergies as of 06/20/2022       Reactions   Gabapentin Other (See Comments)   Gave pt migraine        Medication List     TAKE these medications    ALPRAZolam 0.5 MG tablet Commonly known as: XANAX Take 0.25-0.5 mg by mouth 2 (two) times daily as needed for anxiety.   CALTRATE MINIS PLUS MINERALS PO Take 1 tablet by mouth in the morning and at bedtime.   cholecalciferol 25 MCG (1000 UNIT) tablet Commonly known as: VITAMIN D3 Take 1,000 Units by mouth daily.   clobetasol ointment 0.05 % Commonly known as: TEMOVATE Apply 1 application  topically daily as needed (irritation).   CoQ10 400 MG Caps Take 400 mg by mouth daily.   denosumab 60 MG/ML Soln injection Commonly known as: PROLIA Inject 60 mg into the skin every 6 (six) months. Administer in upper arm, thigh, or abdomen   Fish Oil Ultra 1400 MG Caps Take 1,400 mg by mouth daily.   levothyroxine 75 MCG tablet  Commonly known as: SYNTHROID Take 75 mcg by mouth daily before breakfast.   minoxidil 2.5 MG tablet Commonly known as: LONITEN Take 1.25 mg by mouth daily.   oxyCODONE-acetaminophen 7.5-325 MG tablet Commonly known as: Percocet Take 1 tablet by mouth every 4 (four) hours as needed for severe pain. What changed: when to take this   simvastatin 20 MG tablet Commonly known as: ZOCOR Take 20 mg by mouth at bedtime.        Disposition: home   Final Dx: left L4-5 laminectomy  Discharge Instructions       Remove dressing in 72 hours   Complete by: As directed    Call MD for:  difficulty breathing, headache or visual disturbances   Complete by: As directed    Call MD for:  persistant nausea and vomiting   Complete by: As directed    Call MD for:  redness, tenderness, or signs of infection (pain, swelling, redness, odor or green/yellow discharge around incision site)   Complete by: As directed    Call MD for:  severe uncontrolled pain   Complete by: As directed    Call MD for:  temperature >100.4   Complete by: As directed    Diet - low sodium heart healthy   Complete by: As directed    Discharge instructions   Complete by: As directed    May shower, no liftng or driving, no bending, walk several times per day   Increase activity slowly   Complete by: As directed           Signed: Tiana Loft Kaya Klausing 06/20/2022, 3:43 PM

## 2022-06-20 NOTE — Transfer of Care (Signed)
Immediate Anesthesia Transfer of Care Note  Patient: [name]  Procedure(s) Performed: Left Lumbar Four-Five Hemilaminectomy with resection of synovial cyst (Left: Back)  Patient Location: PACU  Anesthesia Type:General  Level of Consciousness: awake, alert , and oriented  Airway & Oxygen Therapy: Patient Spontanous Breathing and Patient connected to face mask oxygen  Post-op Assessment: Report given to RN and Post -op Vital signs reviewed and stable  Post vital signs: Reviewed and stable  Last Vitals:  Vitals Value Taken Time  BP 151/76 06/20/22 1245  Temp 36.5 C 06/20/22 1242  Pulse 63 06/20/22 1253  Resp 12 06/20/22 1253  SpO2 98 % 06/20/22 1253  Vitals shown include unvalidated device data.  Last Pain:  Vitals:   06/20/22 1242  TempSrc:   PainSc: 7          Complications: No notable events documented.

## 2022-06-20 NOTE — Anesthesia Postprocedure Evaluation (Signed)
Anesthesia Post Note  Patient: [name]  Procedure(s) Performed: Left Lumbar Four-Five Hemilaminectomy with resection of synovial cyst (Left: Back)     Patient location during evaluation: PACU Anesthesia Type: General Level of consciousness: awake and alert Pain management: pain level controlled Vital Signs Assessment: post-procedure vital signs reviewed and stable Respiratory status: spontaneous breathing, nonlabored ventilation and respiratory function stable Cardiovascular status: blood pressure returned to baseline and stable Postop Assessment: no apparent nausea or vomiting Anesthetic complications: no  No notable events documented.  Last Vitals:  Vitals:   06/20/22 1300 06/20/22 1315  BP: (!) 143/75 (!) 145/71  Pulse: 65 67  Resp: (!) 8 (!) 8  Temp:    SpO2: 95% 95%    Last Pain:  Vitals:   06/20/22 1315  TempSrc:   PainSc: 9                  Kazuo Durnil,W. EDMOND

## 2022-06-21 ENCOUNTER — Encounter (HOSPITAL_COMMUNITY): Payer: Self-pay | Admitting: Neurological Surgery

## 2022-06-21 DIAGNOSIS — E039 Hypothyroidism, unspecified: Secondary | ICD-10-CM | POA: Diagnosis not present

## 2022-06-21 DIAGNOSIS — Z853 Personal history of malignant neoplasm of breast: Secondary | ICD-10-CM | POA: Diagnosis not present

## 2022-06-21 DIAGNOSIS — E7800 Pure hypercholesterolemia, unspecified: Secondary | ICD-10-CM | POA: Diagnosis not present

## 2022-06-21 DIAGNOSIS — M5416 Radiculopathy, lumbar region: Secondary | ICD-10-CM | POA: Diagnosis not present

## 2022-06-21 DIAGNOSIS — M4316 Spondylolisthesis, lumbar region: Secondary | ICD-10-CM | POA: Diagnosis not present

## 2022-06-21 DIAGNOSIS — Z79899 Other long term (current) drug therapy: Secondary | ICD-10-CM | POA: Diagnosis not present

## 2022-06-21 DIAGNOSIS — M48061 Spinal stenosis, lumbar region without neurogenic claudication: Secondary | ICD-10-CM | POA: Diagnosis not present

## 2022-06-21 DIAGNOSIS — M7138 Other bursal cyst, other site: Secondary | ICD-10-CM | POA: Diagnosis not present

## 2022-06-21 DIAGNOSIS — Z96653 Presence of artificial knee joint, bilateral: Secondary | ICD-10-CM | POA: Diagnosis not present

## 2022-06-21 NOTE — Progress Notes (Signed)
Patient alert and oriented, voiding adequately, MAE well with no difficulty. Incision area cdi with no s/s of infection. Patient discharged home per order. Patient and husband stated understanding of discharge instructions given. Patient has an appointment with Dr. Jones in 2 weeks 

## 2022-07-05 ENCOUNTER — Other Ambulatory Visit: Payer: Self-pay

## 2022-07-05 ENCOUNTER — Inpatient Hospital Stay (HOSPITAL_COMMUNITY)
Admission: EM | Admit: 2022-07-05 | Discharge: 2022-07-08 | DRG: 392 | Disposition: A | Discharge status: Home / Self Care | Payer: Medicare PPO | Attending: Family Medicine | Admitting: Family Medicine

## 2022-07-05 ENCOUNTER — Emergency Department (HOSPITAL_COMMUNITY): Payer: Medicare PPO

## 2022-07-05 DIAGNOSIS — E039 Hypothyroidism, unspecified: Secondary | ICD-10-CM | POA: Insufficient documentation

## 2022-07-05 DIAGNOSIS — E785 Hyperlipidemia, unspecified: Secondary | ICD-10-CM | POA: Insufficient documentation

## 2022-07-05 DIAGNOSIS — Z807 Family history of other malignant neoplasms of lymphoid, hematopoietic and related tissues: Secondary | ICD-10-CM

## 2022-07-05 DIAGNOSIS — R1900 Intra-abdominal and pelvic swelling, mass and lump, unspecified site: Secondary | ICD-10-CM | POA: Insufficient documentation

## 2022-07-05 DIAGNOSIS — R1032 Left lower quadrant pain: Principal | ICD-10-CM

## 2022-07-05 DIAGNOSIS — Z96653 Presence of artificial knee joint, bilateral: Secondary | ICD-10-CM | POA: Diagnosis present

## 2022-07-05 DIAGNOSIS — Z9079 Acquired absence of other genital organ(s): Secondary | ICD-10-CM | POA: Diagnosis not present

## 2022-07-05 DIAGNOSIS — Z8261 Family history of arthritis: Secondary | ICD-10-CM

## 2022-07-05 DIAGNOSIS — Z9841 Cataract extraction status, right eye: Secondary | ICD-10-CM

## 2022-07-05 DIAGNOSIS — Z90710 Acquired absence of both cervix and uterus: Secondary | ICD-10-CM

## 2022-07-05 DIAGNOSIS — A084 Viral intestinal infection, unspecified: Secondary | ICD-10-CM | POA: Diagnosis present

## 2022-07-05 DIAGNOSIS — R112 Nausea with vomiting, unspecified: Secondary | ICD-10-CM

## 2022-07-05 DIAGNOSIS — Z9889 Other specified postprocedural states: Secondary | ICD-10-CM

## 2022-07-05 DIAGNOSIS — Z9842 Cataract extraction status, left eye: Secondary | ICD-10-CM | POA: Diagnosis not present

## 2022-07-05 DIAGNOSIS — Z9221 Personal history of antineoplastic chemotherapy: Secondary | ICD-10-CM

## 2022-07-05 DIAGNOSIS — E871 Hypo-osmolality and hyponatremia: Secondary | ICD-10-CM | POA: Diagnosis not present

## 2022-07-05 DIAGNOSIS — K5289 Other specified noninfective gastroenteritis and colitis: Secondary | ICD-10-CM | POA: Diagnosis not present

## 2022-07-05 DIAGNOSIS — N281 Cyst of kidney, acquired: Secondary | ICD-10-CM | POA: Diagnosis not present

## 2022-07-05 DIAGNOSIS — E876 Hypokalemia: Secondary | ICD-10-CM | POA: Diagnosis not present

## 2022-07-05 DIAGNOSIS — K529 Noninfective gastroenteritis and colitis, unspecified: Secondary | ICD-10-CM | POA: Diagnosis present

## 2022-07-05 DIAGNOSIS — Z888 Allergy status to other drugs, medicaments and biological substances status: Secondary | ICD-10-CM

## 2022-07-05 DIAGNOSIS — R109 Unspecified abdominal pain: Secondary | ICD-10-CM | POA: Diagnosis present

## 2022-07-05 DIAGNOSIS — Z853 Personal history of malignant neoplasm of breast: Secondary | ICD-10-CM | POA: Diagnosis not present

## 2022-07-05 DIAGNOSIS — R1030 Lower abdominal pain, unspecified: Secondary | ICD-10-CM | POA: Diagnosis not present

## 2022-07-05 DIAGNOSIS — Z806 Family history of leukemia: Secondary | ICD-10-CM

## 2022-07-05 DIAGNOSIS — Z79899 Other long term (current) drug therapy: Secondary | ICD-10-CM

## 2022-07-05 DIAGNOSIS — Z90722 Acquired absence of ovaries, bilateral: Secondary | ICD-10-CM

## 2022-07-05 DIAGNOSIS — E7800 Pure hypercholesterolemia, unspecified: Secondary | ICD-10-CM | POA: Diagnosis not present

## 2022-07-05 DIAGNOSIS — R7401 Elevation of levels of liver transaminase levels: Secondary | ICD-10-CM | POA: Diagnosis present

## 2022-07-05 DIAGNOSIS — R1904 Left lower quadrant abdominal swelling, mass and lump: Secondary | ICD-10-CM | POA: Diagnosis present

## 2022-07-05 DIAGNOSIS — Z923 Personal history of irradiation: Secondary | ICD-10-CM | POA: Diagnosis not present

## 2022-07-05 DIAGNOSIS — Z79890 Hormone replacement therapy (postmenopausal): Secondary | ICD-10-CM

## 2022-07-05 DIAGNOSIS — K5730 Diverticulosis of large intestine without perforation or abscess without bleeding: Secondary | ICD-10-CM | POA: Diagnosis not present

## 2022-07-05 DIAGNOSIS — E861 Hypovolemia: Secondary | ICD-10-CM | POA: Diagnosis present

## 2022-07-05 DIAGNOSIS — R197 Diarrhea, unspecified: Secondary | ICD-10-CM | POA: Diagnosis not present

## 2022-07-05 DIAGNOSIS — R1909 Other intra-abdominal and pelvic swelling, mass and lump: Secondary | ICD-10-CM | POA: Diagnosis not present

## 2022-07-05 LAB — URINALYSIS, ROUTINE W REFLEX MICROSCOPIC
Bilirubin Urine: NEGATIVE
Glucose, UA: NEGATIVE mg/dL
Hgb urine dipstick: NEGATIVE
Ketones, ur: 80 mg/dL — AB
Leukocytes,Ua: NEGATIVE
Nitrite: NEGATIVE
Protein, ur: NEGATIVE mg/dL
Specific Gravity, Urine: 1.028 (ref 1.005–1.030)
pH: 6 (ref 5.0–8.0)

## 2022-07-05 LAB — CBC WITH DIFFERENTIAL/PLATELET
Abs Immature Granulocytes: 0.07 10*3/uL (ref 0.00–0.07)
Basophils Absolute: 0.1 10*3/uL (ref 0.0–0.1)
Basophils Relative: 0 %
Eosinophils Absolute: 0 10*3/uL (ref 0.0–0.5)
Eosinophils Relative: 0 %
HCT: 38.2 % (ref 36.0–46.0)
Hemoglobin: 13.6 g/dL (ref 12.0–15.0)
Immature Granulocytes: 0 %
Lymphocytes Relative: 7 %
Lymphs Abs: 1 10*3/uL (ref 0.7–4.0)
MCH: 32.3 pg (ref 26.0–34.0)
MCHC: 35.6 g/dL (ref 30.0–36.0)
MCV: 90.7 fL (ref 80.0–100.0)
Monocytes Absolute: 0.7 10*3/uL (ref 0.1–1.0)
Monocytes Relative: 4 %
Neutro Abs: 13.7 10*3/uL — ABNORMAL HIGH (ref 1.7–7.7)
Neutrophils Relative %: 89 %
Platelets: 298 10*3/uL (ref 150–400)
RBC: 4.21 MIL/uL (ref 3.87–5.11)
RDW: 12.3 % (ref 11.5–15.5)
WBC: 15.6 10*3/uL — ABNORMAL HIGH (ref 4.0–10.5)
nRBC: 0 % (ref 0.0–0.2)

## 2022-07-05 LAB — COMPREHENSIVE METABOLIC PANEL
ALT: 22 U/L (ref 0–44)
AST: 50 U/L — ABNORMAL HIGH (ref 15–41)
Albumin: 4.4 g/dL (ref 3.5–5.0)
Alkaline Phosphatase: 45 U/L (ref 38–126)
Anion gap: 17 — ABNORMAL HIGH (ref 5–15)
BUN: 13 mg/dL (ref 8–23)
CO2: 17 mmol/L — ABNORMAL LOW (ref 22–32)
Calcium: 9.6 mg/dL (ref 8.9–10.3)
Chloride: 98 mmol/L (ref 98–111)
Creatinine, Ser: 0.87 mg/dL (ref 0.44–1.00)
GFR, Estimated: >60 mL/min (ref >60)
Glucose, Bld: 131 mg/dL — ABNORMAL HIGH (ref 70–99)
Potassium: 3.1 mmol/L — ABNORMAL LOW (ref 3.5–5.1)
Sodium: 132 mmol/L — ABNORMAL LOW (ref 135–145)
Total Bilirubin: 0.9 mg/dL (ref 0.3–1.2)
Total Protein: 7.4 g/dL (ref 6.5–8.1)

## 2022-07-05 LAB — LIPASE, BLOOD: Lipase: 47 U/L (ref 11–51)

## 2022-07-05 LAB — MAGNESIUM: Magnesium: 1.6 mg/dL — ABNORMAL LOW (ref 1.7–2.4)

## 2022-07-05 MED ORDER — MAGNESIUM SULFATE 2 GM/50ML IV SOLN
2.0000 g | Freq: Once | INTRAVENOUS | Status: AC
  Administered 2022-07-05: 2 g via INTRAVENOUS
  Filled 2022-07-05: qty 50

## 2022-07-05 MED ORDER — SODIUM CHLORIDE 0.9 % IV SOLN
2.0000 g | INTRAVENOUS | Status: DC
  Administered 2022-07-05: 2 g via INTRAVENOUS
  Filled 2022-07-05: qty 20

## 2022-07-05 MED ORDER — LEVOTHYROXINE SODIUM 75 MCG PO TABS
75.0000 ug | ORAL_TABLET | Freq: Every day | ORAL | Status: DC
  Administered 2022-07-06 – 2022-07-08 (×2): 75 ug via ORAL
  Filled 2022-07-05 (×2): qty 1

## 2022-07-05 MED ORDER — HYDROMORPHONE HCL 1 MG/ML IJ SOLN
0.5000 mg | INTRAMUSCULAR | Status: DC
  Filled 2022-07-05: qty 1

## 2022-07-05 MED ORDER — IOHEXOL 350 MG/ML SOLN
75.0000 mL | Freq: Once | INTRAVENOUS | Status: AC | PRN
  Administered 2022-07-05: 75 mL via INTRAVENOUS

## 2022-07-05 MED ORDER — HYDROMORPHONE HCL 1 MG/ML IJ SOLN
0.5000 mg | Freq: Once | INTRAMUSCULAR | Status: AC
  Administered 2022-07-05: 0.5 mg via INTRAVENOUS
  Filled 2022-07-05: qty 1

## 2022-07-05 MED ORDER — METRONIDAZOLE 500 MG/100ML IV SOLN
500.0000 mg | Freq: Two times a day (BID) | INTRAVENOUS | Status: DC
  Administered 2022-07-06: 500 mg via INTRAVENOUS
  Filled 2022-07-05: qty 100

## 2022-07-05 MED ORDER — SODIUM CHLORIDE 0.9 % IV BOLUS
1000.0000 mL | Freq: Once | INTRAVENOUS | Status: AC
  Administered 2022-07-05: 1000 mL via INTRAVENOUS

## 2022-07-05 MED ORDER — PROCHLORPERAZINE EDISYLATE 10 MG/2ML IJ SOLN
5.0000 mg | Freq: Four times a day (QID) | INTRAMUSCULAR | Status: DC | PRN
  Administered 2022-07-06 – 2022-07-07 (×2): 5 mg via INTRAVENOUS
  Filled 2022-07-05 (×2): qty 2

## 2022-07-05 MED ORDER — ACETAMINOPHEN 325 MG PO TABS: 650.0000 mg | ORAL_TABLET | Freq: Four times a day (QID) | ORAL | Status: DC | PRN

## 2022-07-05 MED ORDER — POLYETHYLENE GLYCOL 3350 17 G PO PACK: 17.0000 g | PACK | Freq: Every day | ORAL | Status: DC | PRN

## 2022-07-05 MED ORDER — ONDANSETRON HCL 4 MG/2ML IJ SOLN
4.0000 mg | Freq: Once | INTRAMUSCULAR | Status: AC
  Administered 2022-07-05: 4 mg via INTRAVENOUS
  Filled 2022-07-05: qty 2

## 2022-07-05 MED ORDER — ONDANSETRON 4 MG PO TBDP: 4.0000 mg | ORAL_TABLET | Freq: Once | ORAL | Status: DC

## 2022-07-05 MED ORDER — OXYCODONE HCL 5 MG PO TABS
5.0000 mg | ORAL_TABLET | Freq: Four times a day (QID) | ORAL | Status: DC | PRN
  Administered 2022-07-05 – 2022-07-06 (×2): 5 mg via ORAL
  Filled 2022-07-05 (×2): qty 1

## 2022-07-05 MED ORDER — SIMVASTATIN 20 MG PO TABS
20.0000 mg | ORAL_TABLET | Freq: Every day | ORAL | Status: DC
  Administered 2022-07-06 – 2022-07-07 (×2): 20 mg via ORAL
  Filled 2022-07-05 (×2): qty 1

## 2022-07-05 MED ORDER — MORPHINE SULFATE (PF) 4 MG/ML IV SOLN
4.0000 mg | INTRAVENOUS | Status: DC | PRN
  Administered 2022-07-05 (×2): 4 mg via INTRAVENOUS
  Filled 2022-07-05 (×3): qty 1

## 2022-07-05 MED ORDER — HYDROMORPHONE HCL 1 MG/ML IJ SOLN: 0.5000 mg | Freq: Once | INTRAMUSCULAR | Status: DC

## 2022-07-05 MED ORDER — HYDROMORPHONE HCL 1 MG/ML IJ SOLN
0.5000 mg | INTRAMUSCULAR | Status: DC | PRN
  Administered 2022-07-05 – 2022-07-06 (×3): 0.5 mg via INTRAVENOUS
  Filled 2022-07-05 (×2): qty 0.5

## 2022-07-05 MED ORDER — METRONIDAZOLE 500 MG/100ML IV SOLN
500.0000 mg | Freq: Two times a day (BID) | INTRAVENOUS | Status: DC
  Filled 2022-07-05: qty 100

## 2022-07-05 MED ORDER — MELATONIN 5 MG PO TABS: 5.0000 mg | ORAL_TABLET | Freq: Every evening | ORAL | Status: DC | PRN

## 2022-07-05 MED ORDER — POTASSIUM CHLORIDE IN NACL 40-0.9 MEQ/L-% IV SOLN
INTRAVENOUS | Status: AC
  Filled 2022-07-05 (×3): qty 1000

## 2022-07-05 MED ORDER — SODIUM CHLORIDE 0.9 % IV SOLN
1.0000 g | Freq: Once | INTRAVENOUS | Status: DC
  Filled 2022-07-05: qty 10

## 2022-07-05 NOTE — ED Notes (Signed)
Pt is a&ox4, complaining of severe 10/10 lower abdominal pain that began yesterday. Pt complains of N/D, no issues with urination. Pt changed into a gown, attached to monitor/vitals. Pt covered with a warm blanket, side rails up x 2, call light within reach. Pt denies cp/shob.

## 2022-07-05 NOTE — H&P (Addendum)
History and Physical  [name] XBJ:[id number] DOB: 08/23/1950 DOA: 07/05/2022  Referring physician: Clementeen Hoof  PCP: Tara Raider, MD  Outpatient Specialists: Neurosurgery. Patient coming from: Home  Chief Complaint: LLQ abdominal pain.   HPI: [name] is a 72 y.o. female with medical history significant for recent L4-L5 hemilaminectomy with resection of synovial cyst on 06/20/22, history of right breast cancer status postlumpectomy, chemo and radiation therapy, hypothyroidism, hyperlipidemia, who presented to [facility] ED from home due to left lower quadrant abdominal pain.  Associated with nausea vomiting and diarrhea for the past 2 days.  In the ED, vital signs noted for elevated BP and elevated respiration rate, reported severe pain in her left lower quadrant abdomen.  Lab studies notable for electrolyte disturbances, leukocytosis, and elevated AST.  CT abdomen pelvis with contrast revealed the following findings:  Enlarged appendix in the right hemipelvis extending posterior to the cecum measuring up to 11 mm. Minimal stranding. Please correlate for any clinical evidence of appendicitis.   Soft tissue mass in the left hemipelvis measuring 2.4 cm. This abuts several adjacent small bowel loops. The uterus is not seen. The ovaries are poorly defined. This mass would have a differential including bowel pathology such as a stromal tumor. There is a differential however including gynecologic etiologies. Please correlate with any prior imaging or dedicated workup when appropriate.   Rim enhancing fluid collection identified at the lumbar spine posteriorly involving the subcutaneous tissues, musculature and extending into the central canal of the lumbar spine with potential mass effect along the thecal sac. Please correlate for any neurologic symptoms. This very well could be simply postoperative. There is no associated gas but in principle infection can not be excluded by CT alone.  If there is further concern and additional characterization is indicated, MRI of the lumbar spine with and without contrast may be of benefit.   Slightly atypical configuration of the midbody of the stomach with caliber change and focal thickening. Distal stomach also has more smooth wall thickening. Appears could be peristalsis. Please correlate for any specific gastric symptoms. Further workup when clinically appropriate with the oral contrast exam.   Critical Value/emergent results were called by telephone at the time of interpretation on 07/05/2022 at 4:48 pm to provider Maryanna Shape , who verbally acknowledged these results.  EDP discussed the case with general surgery who will see in consultation and provide further recommendations.  The patient received empiric IV antibiotics Rocephin and IV Flagyl.  Admitted by [facility], hospitalist service, to telemetry surgical unit as inpatient status.    ED Course: Temperature 97.9.  BP 1 215/81, pulse 63, respiratory 14, O2 saturation 100% on room air.  Lab studies notable for WBC 15.6, neutrophil count 13.7.  Serum sodium 132, potassium 3.1, serum bicarb 17, glucose 131, anion gap 17, magnesium 1.6, AST 50.  Review of Systems: Review of systems as noted in the HPI. All other systems reviewed and are negative.   Past Medical History:  Diagnosis Date   Anal fissure    Breast cancer, right breast (HCC) 11/1997   S/P lumphectomy, chemo,  and radiation therapy   Broken arm 08/2014   right   Hypercholesteremia    Hypothyroidism    Osteoarthritis    "knees; maybe in my lower back" (04/23/2017)   Personal history of chemotherapy    Personal history of radiation therapy    PONV (postoperative nausea and vomiting)    Thyroid disease    hypothyroid   Past Surgical  History:  Procedure Laterality Date   BLADDER SUSPENSION  02/2002   sparc   BREAST BIOPSY Right 1999   BREAST LUMPECTOMY Right 11/1997   EYE SURGERY Bilateral    bilateral  cataract removal   LUMBAR LAMINECTOMY/DECOMPRESSION MICRODISCECTOMY Left 06/20/2022   Procedure: Left Lumbar Four-Five Hemilaminectomy with resection of synovial cyst;  Surgeon: Tia Alert, MD;  Location: [facility] OR;  Service: Neurosurgery;  Laterality: Left;  3C   MOUTH SURGERY     "dental implants"   NASAL SINUS SURGERY  12/2015   TOTAL KNEE ARTHROPLASTY Right 04/22/2017   Procedure: TOTAL KNEE ARTHROPLASTY;  Surgeon: Gean Birchwood, MD;  Location: [facility] OR;  Service: Orthopedics;  Laterality: Right;   TOTAL KNEE ARTHROPLASTY Left 04/2019   TUBAL LIGATION     VAGINAL HYSTERECTOMY  02/2002   LAVH-BSO    Social History:  reports that she has never smoked. She has never used smokeless tobacco. She reports current alcohol use of about 6.0 - 8.0 standard drinks of alcohol per week. She reports that she does not use drugs.   Allergies  Allergen Reactions   Gabapentin Other (See Comments)    Gave pt migraine    Family History  Problem Relation Age of Onset   Rheum arthritis Mother 34   Leukemia Mother    Non-Hodgkin's lymphoma Sister    Other Father 108       accident      Prior to Admission medications   Medication Sig Start Date End Date Taking? Authorizing Provider  ALPRAZolam Prudy Feeler) 0.5 MG tablet Take 0.25-0.5 mg by mouth 2 (two) times daily as needed for anxiety.    [provider]  Calcium Carbonate-Vit D-Min (CALTRATE MINIS PLUS MINERALS PO) Take 1 tablet by mouth in the morning and at bedtime.    [provider]  cholecalciferol (VITAMIN D3) 25 MCG (1000 UNIT) tablet Take 1,000 Units by mouth daily.    [provider]  clobetasol ointment (TEMOVATE) 0.05 % Apply 1 application  topically daily as needed (irritation). 07/28/19   [provider]  Coenzyme Q10 (COQ10) 400 MG CAPS Take 400 mg by mouth daily.    [provider]  denosumab (PROLIA) 60 MG/ML SOLN injection Inject 60 mg into the skin every 6 (six) months. Administer in upper arm,  thigh, or abdomen    [provider]  levothyroxine (SYNTHROID, LEVOTHROID) 75 MCG tablet Take 75 mcg by mouth daily before breakfast.  05/08/14   [provider]  minoxidil (LONITEN) 2.5 MG tablet Take 1.25 mg by mouth daily. 07/09/19   [provider]  Omega-3 Fatty Acids (FISH OIL ULTRA) 1400 MG CAPS Take 1,400 mg by mouth daily.    [provider]  oxyCODONE-acetaminophen (PERCOCET) 7.5-325 MG tablet Take 1 tablet by mouth every 4 (four) hours as needed for severe pain. 06/20/22   Tia Alert, MD  simvastatin (ZOCOR) 20 MG tablet Take 20 mg by mouth at bedtime.    [provider]    Physical Exam: BP (!) 166/80   Pulse 75   Temp 97.9 F (36.6 C) (Oral)   Resp 10   LMP 03/05/1997   SpO2 100%   General: 72 y.o. year-old female well developed well nourished in no acute distress.  Alert and oriented x3. Cardiovascular: Regular rate and rhythm with no rubs or gallops.  No thyromegaly or JVD noted.  No lower extremity edema. 2/4 pulses in all 4 extremities. Respiratory: Clear to auscultation with no wheezes or  rales. Good inspiratory effort. Abdomen: Soft left lower quadrant abdominal tenderness.  Nondistended with normal bowel sounds x4 quadrants. Muskuloskeletal: No cyanosis, clubbing or edema noted bilaterally Neuro: CN II-XII intact, strength, sensation, reflexes Skin: No ulcerative lesions noted or rashes Psychiatry: Judgement and insight appear normal. Mood is appropriate for condition and setting          Labs on Admission:  Basic Metabolic Panel: Recent Labs  Lab 07/05/22 1634 07/05/22 1855  NA 132*  --   K 3.1*  --   CL 98  --   CO2 17*  --   GLUCOSE 131*  --   BUN 13  --   CREATININE 0.87  --   CALCIUM 9.6  --   MG  --  1.6*   Liver Function Tests: Recent Labs  Lab 07/05/22 1634  AST 50*  ALT 22  ALKPHOS 45  BILITOT 0.9  PROT 7.4  ALBUMIN 4.4   Recent Labs  Lab 07/05/22 1634  LIPASE 47   No results for  input(s): "AMMONIA" in the last 168 hours. CBC: Recent Labs  Lab 07/05/22 1634  WBC 15.6*  NEUTROABS 13.7*  HGB 13.6  HCT 38.2  MCV 90.7  PLT 298   Cardiac Enzymes: No results for input(s): "CKTOTAL", "CKMB", "CKMBINDEX", "TROPONINI" in the last 168 hours.  BNP (last 3 results) No results for input(s): "BNP" in the last 8760 hours.  ProBNP (last 3 results) No results for input(s): "PROBNP" in the last 8760 hours.  CBG: No results for input(s): "GLUCAP" in the last 168 hours.  Radiological Exams on Admission: CT ABDOMEN PELVIS W CONTRAST  Result Date: 07/05/2022 CLINICAL DATA:  Abdominal pain EXAM: CT ABDOMEN AND PELVIS WITH CONTRAST TECHNIQUE: Multidetector CT imaging of the abdomen and pelvis was performed using the standard protocol following bolus administration of intravenous contrast. RADIATION DOSE REDUCTION: This exam was performed according to the departmental dose-optimization program which includes automated exposure control, adjustment of the mA and/or kV according to patient size and/or use of iterative reconstruction technique. CONTRAST:  75mL OMNIPAQUE IOHEXOL 350 MG/ML SOLN COMPARISON:  None Available. FINDINGS: Lower chest: No acute abnormality. Hepatobiliary: No focal liver abnormality is seen. No gallstones, gallbladder wall thickening, or biliary dilatation. Pancreas: Unremarkable. No pancreatic ductal dilatation or surrounding inflammatory changes. Spleen: 5 mm cystic focus along the inferior aspect of the spleen. Benign. No follow up. Spleen is not enlarged. Adrenals/Urinary Tract: Adrenal glands are unremarkable. Kidneys are normal, without renal calculi, or hydronephrosis. A few tiny cystic lesions identified bilateral, likely small cysts, less than 1 cm and too small to completely characterize. Bosniak 2. No follow up. Bladder is unremarkable. Stomach/Bowel: There is some debris in the stomach. There is high attenuation material in the stomach with questionable  lesion in the midbody with a caliber change. Focal wall thickening in this location as well. This stomach also has some slight wall thickening. Stomach is underdistended. Small bowel is nondilated. Large bowel is nondilated. Scattered colonic diverticula. There is a rounded soft tissue mass in the left hemipelvis measuring 2.4 by 2.2 by 2.3 cm. This abuts a small bowel loop. Possibility would include a exophytic mass from the wall of the loop of bowel such as a gastrointestinal stromal tumor. There are other differential points as well. Please correlate with any prior or further workup is recommended. The appendix extends posterior to the cecum in the right hemipelvis but is dilated up to 11 mm on axial series 3, image 57. Sagittal series 7,  image 50. Please correlate with any clinical presentation of acute appendicitis. Vascular/Lymphatic: Normal caliber aorta and IVC. Mild vascular calcifications. No specific abnormal lymph node enlargement identified in the abdomen and pelvis. Reproductive: Absence of the uterus. Ovaries are poorly seen. Please correlate with clinical history. Other: Mild anasarca.  No free intra-air or free fluid. Musculoskeletal: Scattered degenerative changes of the spine and pelvis. Patient is status post left L4-5 hemilaminectomy. Involving the surgical area is a rim enhancing fluid collection. This extends into the central canal where there is a component of fluid seen narrowing of the thecal sac. Fluid along the canal extends from L4-5 down to S1. Please see sagittal series 7, image 66, axial series 3, image 35. This collection is without gas. Dimensions are measured at a proximally 7.8 by 3.5 by 5.3 cm. IMPRESSION: Enlarged appendix in the right hemipelvis extending posterior to the cecum measuring up to 11 mm. Minimal stranding. Please correlate for any clinical evidence of appendicitis. Soft tissue mass in the left hemipelvis measuring 2.4 cm. This abuts several adjacent small bowel  loops. The uterus is not seen. The ovaries are poorly defined. This mass would have a differential including bowel pathology such as a stromal tumor. There is a differential however including gynecologic etiologies. Please correlate with any prior imaging or dedicated workup when appropriate. Rim enhancing fluid collection identified at the lumbar spine posteriorly involving the subcutaneous tissues, musculature and extending into the central canal of the lumbar spine with potential mass effect along the thecal sac. Please correlate for any neurologic symptoms. This very well could be simply postoperative. There is no associated gas but in principle infection can not be excluded by CT alone. If there is further concern and additional characterization is indicated, MRI of the lumbar spine with and without contrast may be of benefit. Slightly atypical configuration of the midbody of the stomach with caliber change and focal thickening. Distal stomach also has more smooth wall thickening. Appears could be peristalsis. Please correlate for any specific gastric symptoms. Further workup when clinically appropriate with the oral contrast exam. Critical Value/emergent results were called by telephone at the time of interpretation on 07/05/2022 at 4:48 pm to provider Maryanna Shape , who verbally acknowledged these results. Electronically Signed   By: Karen Kays M.D.   On: 07/05/2022 19:53    EKG: I independently viewed the EKG done and my findings are as followed: Normal sinus rhythm rate of 63.  Nonspecific ST-T changes.  QTc 466.  Assessment/Plan Present on Admission:  Abdominal pain  Principal Problem:   Abdominal pain  Left lower quadrant abdominal pain, unclear etiology Possible acute appendicitis, POA CT abdomen pelvis showing inflammation of the appendix, other abnormalities as stated in HPI. General surgery consulted, Dr. Freida Busman, will provide further recommendations Started on empiric IV antibiotics for  possible acute appendicitis. IV analgesics as needed IV antiemetics as needed Monitor fever curve and WBC.  Soft tissue mass in the left hemipelvis measuring 2.4 cm Recommend follow-up with gynecology or GI outpatient  Recent spinal surgery L4-L5 hemilaminectomy with resection of synovial cyst on 06/20/22 Denies having any significant back pain Monitor for now  Hypothyroidism Resume home levothyroxine.  Hyperlipidemia Resume home simvastatin.  Electrolytes disturbances Hypovolemic hyponatremia, serum sodium 131 Hypokalemia, serum potassium 3.1 Hypomagnesemia 1.6 Electrolytes repleted NS KCl 40 mEq at 100 cc/h x 1 day Magnesium repleted intravenously 2 g x 1. Recheck electrolytes levels in the morning   DVT prophylaxis: SCDs due to possible procedure.  Defer  pharmacological DVT prophylaxis to general surgery.  Code Status: Full code.  Family Communication: Updated the patient's spouse at bedside.  Disposition Plan: Admitted to telemetry surgical unit.  Consults called: General surgery.  Admission status: Inpatient status.   Status is: Inpatient Patient requires at least 2 midnights for further evaluation and treatment of present condition.   Darlin Drop MD [facility] Pager [phone]  If 7PM-7AM, please contact night-coverage [email or web address] Password [facility]  07/05/2022, 9:50 PM

## 2022-07-05 NOTE — ED Provider Triage Note (Signed)
Emergency Medicine Provider Triage Evaluation Note  [name] , a 72 y.o. female  was evaluated in triage.  Pt complains of abdominal pain and emesis.  Patient reports that she recently had a back surgery a few weeks ago and has been healing without any difficulty.  Patient aware that she began experiencing diffuse abdominal pain and diarrhea starting 2 days ago.  Reports that she took Imodium and Pepto-Bismol yesterday which helped to improve the diarrhea but diarrhea still present.  No prior history of known abdominal abnormalities.  Review of Systems  Positive: As above Negative: As above  Physical Exam  BP (!) 169/79 (BP Location: Left Arm)   Pulse (!) 57   Temp 98 F (36.7 C) (Tympanic)   Resp 20   LMP 03/05/1997   SpO2 97%  Gen:   Uncomfortable Resp:  Normal effort  MSK:   Moves extremities without difficulty  Other:  RLQ TTP  Medical Decision Making  Medically screening exam initiated at 4:42 PM.  Appropriate orders placed.  Quinetta R Wynns was informed that the remainder of the evaluation will be completed by another provider, this initial triage assessment does not replace that evaluation, and the importance of remaining in the ED until their evaluation is complete.     Smitty Knudsen, PA-C 07/05/22 2156

## 2022-07-05 NOTE — Consult Note (Signed)
 [name] 06/25/1950  [id number].    Requesting MD: Joshua Geiple, PA-C Chief Complaint/Reason for Consult: abdominal pain, possible appendicitis  HPI:  [name] is a 71 yo Luna resenting to the ED with worsening diarrhea, vomiting, and abdominal pain.  She began having diarrhea 2 nights ago which persisted.  She has also had cramping abdominal pain in the lower abdomen, which is primarily in the left lower quadrant.  She denies periumbilical or right lower quadrant pain.  She has had chills but no documented fevers at home.  She has also had vomiting today.  Labs in the ED are significant for WBC of 15.  A CT scan showed mild enlargement of the appendix to 11 mm, concerning for possible appendicitis.  She was also incidentally noted to have a 2.4 cm soft tissue mass in the left lower quadrant.  General surgery was consulted to evaluate for appendicitis  She recently had an L4-L5 hemilaminectomy on 4/17 and says she recovered well.  ROS: Review of Systems  Constitutional:  Negative for chills and fever.  Respiratory:  Negative for shortness of breath and stridor.   Gastrointestinal:  Positive for abdominal pain, diarrhea, nausea and vomiting.    Family History  Problem Relation Age of Onset   Rheum arthritis Mother 54   Leukemia Mother    Non-Hodgkin's lymphoma Sister    Other Father 40       accident    Past Medical History:  Diagnosis Date   Anal fissure    Breast cancer, right breast (HCC) 11/1997   S/P lumphectomy, chemo,  and radiation therapy   Broken arm 08/2014   right   Hypercholesteremia    Hypothyroidism    Osteoarthritis    "knees; maybe in my lower back" (04/23/2017)   Personal history of chemotherapy    Personal history of radiation therapy    PONV (postoperative nausea and vomiting)    Thyroid disease    hypothyroid    Past Surgical History:  Procedure Laterality Date   BLADDER SUSPENSION  02/2002   sparc   BREAST BIOPSY Right 1999   BREAST  LUMPECTOMY Right 11/1997   EYE SURGERY Bilateral    bilateral cataract removal   LUMBAR LAMINECTOMY/DECOMPRESSION MICRODISCECTOMY Left 06/20/2022   Procedure: Left Lumbar Four-Five Hemilaminectomy with resection of synovial cyst;  Surgeon: Jones, David S, MD;  Location: [facility] OR;  Service: Neurosurgery;  Laterality: Left;  3C   MOUTH SURGERY     "dental implants"   NASAL SINUS SURGERY  12/2015   TOTAL KNEE ARTHROPLASTY Right 04/22/2017   Procedure: TOTAL KNEE ARTHROPLASTY;  Surgeon: Rowan, Frank, MD;  Location: [facility] OR;  Service: Orthopedics;  Laterality: Right;   TOTAL KNEE ARTHROPLASTY Left 04/2019   TUBAL LIGATION     VAGINAL HYSTERECTOMY  02/2002   LAVH-BSO    Social History:  reports that she has never smoked. She has never used smokeless tobacco. She reports current alcohol use of about 6.0 - 8.0 standard drinks of alcohol per week. She reports that she does not use drugs.  Allergies:  Allergies  Allergen Reactions   Gabapentin Other (See Comments)    Gave pt migraine    (Not in a hospital admission)    Physical Exam: Blood pressure (!) 158/81, pulse 81, temperature 97.9 F (36.6 C), temperature source Oral, resp. rate 20, last menstrual period 03/05/1997, SpO2 100 %. General: resting comfortably, appears stated age, no apparent distress Neurological: alert and oriented, no focal deficits HEENT: normocephalic,   atraumatic CV: regular rate and rhythm, extremities warm and well-perfused Respiratory: normal work of breathing on room air Abdomen: soft, nondistended, no tenderness to palpation.  Specifically, no right lower quadrant tenderness to deep palpation. Extremities: warm and well-perfused, no deformities, moving all extremities spontaneously Psychiatric: normal mood and affect Skin: warm and dry, no jaundice, no rashes or lesions   Results for orders placed or performed during the hospital encounter of 07/05/22 (from the past 48 hour(s))  Comprehensive metabolic panel      Status: Abnormal   Collection Time: 07/05/22  4:34 PM  Result Value Ref Range   Sodium 132 (L) 135 - 145 mmol/L   Potassium 3.1 (L) 3.5 - 5.1 mmol/L   Chloride 98 98 - 111 mmol/L   CO2 17 (L) 22 - 32 mmol/L   Glucose, Bld 131 (H) 70 - 99 mg/dL    Comment: Glucose reference range applies only to samples taken after fasting for at least 8 hours.   BUN 13 8 - 23 mg/dL   Creatinine, Ser 0.87 0.44 - 1.00 mg/dL   Calcium 9.6 8.9 - 10.3 mg/dL   Total Protein 7.4 6.5 - 8.1 g/dL   Albumin 4.4 3.5 - 5.0 g/dL   AST 50 (H) 15 - 41 U/L   ALT 22 0 - 44 U/L   Alkaline Phosphatase 45 38 - 126 U/L   Total Bilirubin 0.9 0.3 - 1.2 mg/dL   GFR, Estimated >60 >60 mL/min    Comment: (NOTE) Calculated using the CKD-EPI Creatinine Equation (2021)    Anion gap 17 (H) 5 - 15    Comment: Performed at [facility], [address] [place], [place] [address]  CBC with Differential     Status: Abnormal   Collection Time: 07/05/22  4:34 PM  Result Value Ref Range   WBC 15.6 (H) 4.0 - 10.5 K/uL   RBC 4.21 3.87 - 5.11 MIL/uL   Hemoglobin 13.6 12.0 - 15.0 g/dL   HCT 38.2 36.0 - 46.0 %   MCV 90.7 80.0 - 100.0 fL   MCH 32.3 26.0 - 34.0 pg   MCHC 35.6 30.0 - 36.0 g/dL   RDW 12.3 11.5 - 15.5 %   Platelets 298 150 - 400 K/uL   nRBC 0.0 0.0 - 0.2 %   Neutrophils Relative % 89 %   Neutro Abs 13.7 (H) 1.7 - 7.7 K/uL   Lymphocytes Relative 7 %   Lymphs Abs 1.0 0.7 - 4.0 K/uL   Monocytes Relative 4 %   Monocytes Absolute 0.7 0.1 - 1.0 K/uL   Eosinophils Relative 0 %   Eosinophils Absolute 0.0 0.0 - 0.5 K/uL   Basophils Relative 0 %   Basophils Absolute 0.1 0.0 - 0.1 K/uL   Immature Granulocytes 0 %   Abs Immature Granulocytes 0.07 0.00 - 0.07 K/uL    Comment: Performed at [facility], [address] [place], [place] [address]  Lipase, blood     Status: None   Collection Time: 07/05/22  4:34 PM  Result Value Ref Range   Lipase 47 11 - 51 U/L    Comment: Performed at [facility],  [address] [place], [place] [address]  Urinalysis, Routine w reflex microscopic -Urine, Clean Catch     Status: Abnormal   Collection Time: 07/05/22  6:28 PM  Result Value Ref Range   Color, Urine YELLOW YELLOW   APPearance CLEAR CLEAR   Specific Gravity, Urine 1.028 1.005 - 1.030     pH 6.0 5.0 - 8.0   Glucose, UA NEGATIVE NEGATIVE mg/dL   Hgb urine dipstick NEGATIVE NEGATIVE   Bilirubin Urine NEGATIVE NEGATIVE   Ketones, ur 80 (A) NEGATIVE mg/dL   Protein, ur NEGATIVE NEGATIVE mg/dL   Nitrite NEGATIVE NEGATIVE   Leukocytes,Ua NEGATIVE NEGATIVE    Comment: Performed at [facility], [address] [place], [place] [address]  Magnesium     Status: Abnormal   Collection Time: 07/05/22  6:55 PM  Result Value Ref Range   Magnesium 1.6 (L) 1.7 - 2.4 mg/dL    Comment: Performed at [facility], [address] [place], [place] [address]   CT ABDOMEN PELVIS W CONTRAST  Result Date: 07/05/2022 CLINICAL DATA:  Abdominal pain EXAM: CT ABDOMEN AND PELVIS WITH CONTRAST TECHNIQUE: Multidetector CT imaging of the abdomen and pelvis was performed using the standard protocol following bolus administration of intravenous contrast. RADIATION DOSE REDUCTION: Tara exam was performed according to the departmental dose-optimization program which includes automated exposure control, adjustment of the mA and/or kV according to patient size and/or use of iterative reconstruction technique. CONTRAST:  75mL OMNIPAQUE IOHEXOL 350 MG/ML SOLN COMPARISON:  None Available. FINDINGS: Lower chest: No acute abnormality. Hepatobiliary: No focal liver abnormality is seen. No gallstones, gallbladder wall thickening, or biliary dilatation. Pancreas: Unremarkable. No pancreatic ductal dilatation or surrounding inflammatory changes. Spleen: 5 mm cystic focus along the inferior aspect of the spleen. Benign. No follow up. Spleen is not enlarged. Adrenals/Urinary Tract: Adrenal glands are unremarkable. Kidneys are normal,  without renal calculi, or hydronephrosis. A few tiny cystic lesions identified bilateral, likely small cysts, less than 1 cm and too small to completely characterize. Bosniak 2. No follow up. Bladder is unremarkable. Stomach/Bowel: There is some debris in the stomach. There is high attenuation material in the stomach with questionable lesion in the midbody with a caliber change. Focal wall thickening in Tara location as well. Tara stomach also has some slight wall thickening. Stomach is underdistended. Small bowel is nondilated. Large bowel is nondilated. Scattered colonic diverticula. There is a rounded soft tissue mass in the left hemipelvis measuring 2.4 by 2.2 by 2.3 cm. Tara abuts a small bowel loop. Possibility would include a exophytic mass from the wall of the loop of bowel such as a gastrointestinal stromal tumor. There are other differential points as well. Please correlate with any prior or further workup is recommended. The appendix extends posterior to the cecum in the right hemipelvis but is dilated up to 11 mm on axial series 3, image 57. Sagittal series 7, image 50. Please correlate with any clinical presentation of acute appendicitis. Vascular/Lymphatic: Normal caliber aorta and IVC. Mild vascular calcifications. No specific abnormal lymph node enlargement identified in the abdomen and pelvis. Reproductive: Absence of the uterus. Ovaries are poorly seen. Please correlate with clinical history. Other: Mild anasarca.  No free intra-air or free fluid. Musculoskeletal: Scattered degenerative changes of the spine and pelvis. Patient is status post left L4-5 hemilaminectomy. Involving the surgical area is a rim enhancing fluid collection. Tara extends into the central canal where there is a component of fluid seen narrowing of the thecal sac. Fluid along the canal extends from L4-5 down to S1. Please see sagittal series 7, image 66, axial series 3, image 35. Tara collection is without gas. Dimensions are  measured at a proximally 7.8 by 3.5 by 5.3 cm. IMPRESSION: Enlarged appendix in the right hemipelvis extending posterior to the cecum measuring up to 11 mm.   Minimal stranding. Please correlate for any clinical evidence of appendicitis. Soft tissue mass in the left hemipelvis measuring 2.4 cm. Tara abuts several adjacent small bowel loops. The uterus is not seen. The ovaries are poorly defined. Tara mass would have a differential including bowel pathology such as a stromal tumor. There is a differential however including gynecologic etiologies. Please correlate with any prior imaging or dedicated workup when appropriate. Rim enhancing fluid collection identified at the lumbar spine posteriorly involving the subcutaneous tissues, musculature and extending into the central canal of the lumbar spine with potential mass effect along the thecal sac. Please correlate for any neurologic symptoms. Tara very well could be simply postoperative. There is no associated gas but in principle infection can not be excluded by CT alone. If there is further concern and additional characterization is indicated, MRI of the lumbar spine with and without contrast may be of benefit. Slightly atypical configuration of the midbody of the stomach with caliber change and focal thickening. Distal stomach also has more smooth wall thickening. Appears could be peristalsis. Please correlate for any specific gastric symptoms. Further workup when clinically appropriate with the oral contrast exam. Critical Value/emergent results were called by telephone at the time of interpretation on 07/05/2022 at 4:48 pm to provider OSCAR ZELAYA , who verbally acknowledged these results. Electronically Signed   By: Ashok  Gupta M.D.   On: 07/05/2022 19:53      Assessment/Plan [name] presenting with diarrhea, nausea, vomiting, and left lower quadrant abdominal pain, as well as leukocytosis.  I personally reviewed her labs, imaging, and  notes.  Her appendix is very mildly dilated and fluid-filled on her CT scan, however her clinical history is not typical of appendicitis.  In addition, she is not at all tender in the right lower quadrant on exam.  I would favor another diagnosis such as viral gastroenteritis over appendicitis.  However I agree with empiric antibiotics.  We will examine the patient again in the morning to reassess her symptoms.  She is happy to avoid surgery for now.  Incidentally she also had a mass noted in the left hemipelvis on her CT scan.  Tara is unlikely to be contributing to her symptoms.  Tara appears to be associated with the small bowel but it is difficult to determine definitively.  Tara may represent a GIST.  She will need further workup of Tara and likely an MRI.   Sahily Biddle, MD [facility], Hepatobiliary and Pancreatic Surgery 07/05/22 10:24 PM   

## 2022-07-05 NOTE — ED Provider Notes (Signed)
[place] EMERGENCY DEPARTMENT AT [facility] Provider Note   CSN: [id number] Arrival date & time: 07/05/22  1605     History  Chief Complaint  Patient presents with   Abdominal Pain   Emesis    [name] is a 72 y.o. female.  Patient with history of hysterectomy, recent lumbar laminectomy 2 weeks ago --presents to the emergency department for evaluation of abdominal pain, cramps, diarrhea.  Pain is in the left lower quadrant.  Pain is severe at times.  Started about 2 days ago.  Stool has been watery without blood.  No urinary symptoms including dysuria, increased frequency or urgency, hematuria.  Patient has started having vomiting on arrival to the ED today around 4 PM.  No chest pain or shortness of breath.  No fevers.  Diarrhea slowed with Imodium.  Home medications not helping with pain.  Patient and family have noted a small amount of swelling around the incision site in her lower back with no overlying redness or drainage.  Normal colonoscopy 2019.       Home Medications Prior to Admission medications   Medication Sig Start Date End Date Taking? Authorizing Provider  ALPRAZolam Prudy Feeler) 0.5 MG tablet Take 0.25-0.5 mg by mouth 2 (two) times daily as needed for anxiety.    [provider]  Calcium Carbonate-Vit D-Min (CALTRATE MINIS PLUS MINERALS PO) Take 1 tablet by mouth in the morning and at bedtime.    [provider]  cholecalciferol (VITAMIN D3) 25 MCG (1000 UNIT) tablet Take 1,000 Units by mouth daily.    [provider]  clobetasol ointment (TEMOVATE) 0.05 % Apply 1 application  topically daily as needed (irritation). 07/28/19   [provider]  Coenzyme Q10 (COQ10) 400 MG CAPS Take 400 mg by mouth daily.    [provider]  denosumab (PROLIA) 60 MG/ML SOLN injection Inject 60 mg into the skin every 6 (six) months. Administer in upper arm, thigh, or abdomen    [provider]  levothyroxine (SYNTHROID,  LEVOTHROID) 75 MCG tablet Take 75 mcg by mouth daily before breakfast.  05/08/14   [provider]  minoxidil (LONITEN) 2.5 MG tablet Take 1.25 mg by mouth daily. 07/09/19   [provider]  Omega-3 Fatty Acids (FISH OIL ULTRA) 1400 MG CAPS Take 1,400 mg by mouth daily.    [provider]  oxyCODONE-acetaminophen (PERCOCET) 7.5-325 MG tablet Take 1 tablet by mouth every 4 (four) hours as needed for severe pain. 06/20/22   Tia Alert, MD  simvastatin (ZOCOR) 20 MG tablet Take 20 mg by mouth at bedtime.    [provider]      Allergies    Gabapentin    Review of Systems   Review of Systems  Physical Exam Updated Vital Signs BP (!) 186/81   Pulse 74   Temp 98 F (36.7 C) (Tympanic)   Resp 18   LMP 03/05/1997   SpO2 100%   Physical Exam Vitals and nursing note reviewed.  Constitutional:      General: She is in acute distress.     Appearance: She is well-developed.     Comments: Patient looks very uncomfortable.  HENT:     Head: Normocephalic and atraumatic.     Right Ear: External ear normal.     Left Ear: External ear normal.     Nose: Nose normal.  Eyes:     Conjunctiva/sclera: Conjunctivae normal.  Cardiovascular:     Rate and Rhythm:  Normal rate and regular rhythm.     Heart sounds: No murmur heard. Pulmonary:     Effort: No respiratory distress.     Breath sounds: No wheezing, rhonchi or rales.  Abdominal:     Palpations: Abdomen is soft.     Tenderness: There is abdominal tenderness in the left lower quadrant. There is no guarding or rebound. Negative signs include Murphy's sign and McBurney's sign.  Musculoskeletal:     Cervical back: Normal range of motion and neck supple.     Right lower leg: No edema.     Left lower leg: No edema.  Skin:    General: Skin is warm and dry.     Findings: No rash.  Neurological:     General: No focal deficit present.     Mental Status: She is alert. Mental status is at baseline.     Motor:  No weakness.  Psychiatric:        Mood and Affect: Mood normal.     ED Results / Procedures / Treatments   Labs (all labs ordered are listed, but only abnormal results are displayed) Labs Reviewed  COMPREHENSIVE METABOLIC PANEL - Abnormal; Notable for the following components:      Result Value   Sodium 132 (*)    Potassium 3.1 (*)    CO2 17 (*)    Glucose, Bld 131 (*)    AST 50 (*)    Anion gap 17 (*)    All other components within normal limits  CBC WITH DIFFERENTIAL/PLATELET - Abnormal; Notable for the following components:   WBC 15.6 (*)    Neutro Abs 13.7 (*)    All other components within normal limits  MAGNESIUM - Abnormal; Notable for the following components:   Magnesium 1.6 (*)    All other components within normal limits  URINALYSIS, ROUTINE W REFLEX MICROSCOPIC - Abnormal; Notable for the following components:   Ketones, ur 80 (*)    All other components within normal limits  LIPASE, BLOOD    EKG None  Radiology CT ABDOMEN PELVIS W CONTRAST  Result Date: 07/05/2022 CLINICAL DATA:  Abdominal pain EXAM: CT ABDOMEN AND PELVIS WITH CONTRAST TECHNIQUE: Multidetector CT imaging of the abdomen and pelvis was performed using the standard protocol following bolus administration of intravenous contrast. RADIATION DOSE REDUCTION: This exam was performed according to the departmental dose-optimization program which includes automated exposure control, adjustment of the mA and/or kV according to patient size and/or use of iterative reconstruction technique. CONTRAST:  75mL OMNIPAQUE IOHEXOL 350 MG/ML SOLN COMPARISON:  None Available. FINDINGS: Lower chest: No acute abnormality. Hepatobiliary: No focal liver abnormality is seen. No gallstones, gallbladder wall thickening, or biliary dilatation. Pancreas: Unremarkable. No pancreatic ductal dilatation or surrounding inflammatory changes. Spleen: 5 mm cystic focus along the inferior aspect of the spleen. Benign. No follow up. Spleen  is not enlarged. Adrenals/Urinary Tract: Adrenal glands are unremarkable. Kidneys are normal, without renal calculi, or hydronephrosis. A few tiny cystic lesions identified bilateral, likely small cysts, less than 1 cm and too small to completely characterize. Bosniak 2. No follow up. Bladder is unremarkable. Stomach/Bowel: There is some debris in the stomach. There is high attenuation material in the stomach with questionable lesion in the midbody with a caliber change. Focal wall thickening in this location as well. This stomach also has some slight wall thickening. Stomach is underdistended. Small bowel is nondilated. Large bowel is nondilated. Scattered colonic diverticula. There is a rounded soft tissue mass in the  left hemipelvis measuring 2.4 by 2.2 by 2.3 cm. This abuts a small bowel loop. Possibility would include a exophytic mass from the wall of the loop of bowel such as a gastrointestinal stromal tumor. There are other differential points as well. Please correlate with any prior or further workup is recommended. The appendix extends posterior to the cecum in the right hemipelvis but is dilated up to 11 mm on axial series 3, image 57. Sagittal series 7, image 50. Please correlate with any clinical presentation of acute appendicitis. Vascular/Lymphatic: Normal caliber aorta and IVC. Mild vascular calcifications. No specific abnormal lymph node enlargement identified in the abdomen and pelvis. Reproductive: Absence of the uterus. Ovaries are poorly seen. Please correlate with clinical history. Other: Mild anasarca.  No free intra-air or free fluid. Musculoskeletal: Scattered degenerative changes of the spine and pelvis. Patient is status post left L4-5 hemilaminectomy. Involving the surgical area is a rim enhancing fluid collection. This extends into the central canal where there is a component of fluid seen narrowing of the thecal sac. Fluid along the canal extends from L4-5 down to S1. Please see sagittal  series 7, image 66, axial series 3, image 35. This collection is without gas. Dimensions are measured at a proximally 7.8 by 3.5 by 5.3 cm. IMPRESSION: Enlarged appendix in the right hemipelvis extending posterior to the cecum measuring up to 11 mm. Minimal stranding. Please correlate for any clinical evidence of appendicitis. Soft tissue mass in the left hemipelvis measuring 2.4 cm. This abuts several adjacent small bowel loops. The uterus is not seen. The ovaries are poorly defined. This mass would have a differential including bowel pathology such as a stromal tumor. There is a differential however including gynecologic etiologies. Please correlate with any prior imaging or dedicated workup when appropriate. Rim enhancing fluid collection identified at the lumbar spine posteriorly involving the subcutaneous tissues, musculature and extending into the central canal of the lumbar spine with potential mass effect along the thecal sac. Please correlate for any neurologic symptoms. This very well could be simply postoperative. There is no associated gas but in principle infection can not be excluded by CT alone. If there is further concern and additional characterization is indicated, MRI of the lumbar spine with and without contrast may be of benefit. Slightly atypical configuration of the midbody of the stomach with caliber change and focal thickening. Distal stomach also has more smooth wall thickening. Appears could be peristalsis. Please correlate for any specific gastric symptoms. Further workup when clinically appropriate with the oral contrast exam. Critical Value/emergent results were called by telephone at the time of interpretation on 07/05/2022 at 4:48 pm to provider Maryanna Shape , who verbally acknowledged these results. Electronically Signed   By: Karen Kays M.D.   On: 07/05/2022 19:53    Procedures Procedures    Medications Ordered in ED Medications  morphine (PF) 4 MG/ML injection 4 mg (4 mg  Intravenous Given 07/05/22 1931)  magnesium sulfate IVPB 2 g 50 mL (has no administration in time range)  cefTRIAXone (ROCEPHIN) 1 g in sodium chloride 0.9 % 100 mL IVPB (has no administration in time range)  metroNIDAZOLE (FLAGYL) IVPB 500 mg (has no administration in time range)  ondansetron (ZOFRAN) injection 4 mg (4 mg Intravenous Given 07/05/22 1657)  HYDROmorphone (DILAUDID) injection 0.5 mg (0.5 mg Intravenous Given 07/05/22 1658)  HYDROmorphone (DILAUDID) injection 0.5 mg (0.5 mg Intravenous Given 07/05/22 1826)  ondansetron (ZOFRAN) injection 4 mg (4 mg Intravenous Given 07/05/22 1826)  iohexol (  OMNIPAQUE) 350 MG/ML injection 75 mL (75 mLs Intravenous Contrast Given 07/05/22 1907)  sodium chloride 0.9 % bolus 1,000 mL (1,000 mLs Intravenous New Bag/Given 07/05/22 2007)    ED Course/ Medical Decision Making/ A&P    Patient seen and examined. History obtained directly from patient. Work-up including labs, imaging, EKG ordered in triage, if performed, were reviewed.    Labs/EKG: Independently reviewed and interpreted.  This included: CBC with elevated white blood cell count of 15.6, hemoglobin 13.6; CMP with low sodium at 132, potassium low at 3.1, glucose 131 with slightly elevated anion gap at 17 and bicarb of 17; lipase normal; magnesium minimally low at 1.6; UA without compelling signs of infection, 80 ketones.  Imaging: CT pending   Medications/Fluids: Ordered: Patient received a dose of IV Dilaudid on arrival which she did not feel helped very much.  This was reordered.   Most recent vital signs reviewed and are as follows: BP (!) 166/80   Pulse 75   Temp 97.9 F (36.6 C) (Oral)   Resp 10   LMP 03/05/1997   SpO2 100%   Initial impression: Abdominal pain, vomiting and diarrhea.    Reassessment performed. Patient appears stable.  I was called by the radiologist and we discussed multiple problems noted in report.  Imaging personally visualized and interpreted including: CT of the  abdomen pelvis, agree with findings including dilated appendix, soft tissue mass left lower quadrant of undetermined etiology, postoperative changes as noted, stomach findings.  Reviewed pertinent lab work and imaging with patient at bedside. Questions answered.   Most current vital signs reviewed and are as follows: BP (!) 166/80   Pulse 75   Temp 97.9 F (36.6 C) (Oral)   Resp 10   LMP 03/05/1997   SpO2 100%   Plan: Will discuss with neurosurgery and general surgery.  8:29 PM Consulted with Dr. Freida Busman by telephone. Gen surgery to consult. She will see. Likely abx, observation, agrees with medical admit.   9:18 PM I have ordered IV Rocephin and metronidazole.  Patient receiving fluid bolus.  Will replete magnesium with IV magnesium.  Awaiting surgery recommendations and will consider giving oral potassium if patient is able to eat and drink.  Neurosurgery discussed case by telephone with Dr. Adela Lank.  Per Dr. Adela Lank, patient may have a spinal fluid leak however no emergent need for intervention as she is asymptomatic.  Will discuss with hospitalist regarding admission.  9:31 PM I consulted with [facility] hospitalist by telephone.  They will see patient for admission.                           Medical Decision Making Amount and/or Complexity of Data Reviewed Labs: ordered.  Risk Prescription drug management.   Patient with 2 days of left lower quadrant abdominal pain, vomiting, diarrhea which has led to hypokalemia and hypomagnesemia.  CT with several findings today including a dilated appendix which does not correlate with the patient's current location of pain.  She does have an elevated white blood cell count as well.  For this reason she will be admitted and observed, followed by the surgical team, and given IV antibiotics.  Postoperative findings discussed with neurosurgery, no immediate interventions or other imaging requested.  Patient also with some findings in regards to the  stomach and a soft tissue mass in the left lower quadrant of unclear etiology.  This may require GI follow-up.  For this patient's complaint of abdominal pain,  the following conditions were considered on the differential diagnosis: gastritis/PUD, enteritis/duodenitis, appendicitis, cholelithiasis/cholecystitis, cholangitis, pancreatitis, ruptured viscus, colitis, diverticulitis, small/large bowel obstruction, proctitis, cystitis, pyelonephritis, ureteral colic, aortic dissection, aortic aneurysm. In women, ectopic pregnancy, pelvic inflammatory disease, ovarian cysts, and tubo-ovarian abscess were also considered. Atypical chest etiologies were also considered including ACS, PE, and pneumonia.         Final Clinical Impression(s) / ED Diagnoses Final diagnoses:  Left lower quadrant abdominal pain  Nausea vomiting and diarrhea  Hypomagnesemia  Hypokalemia    Rx / DC Orders ED Discharge Orders     None         Renne Crigler, PA-C 07/05/22 2133    Melene Plan, DO 07/05/22 2143

## 2022-07-05 NOTE — ED Triage Notes (Signed)
Pt with diffuse abdominal pain and diarrhea since Tuesday night .   Pt reports yesterday taking immodium and pepto and the diarrhea slowed but today has had severe cramps and vomiting.  Pt had back surgery a couple of weeks ago and reports good recovery from that with no complications and has had her follow up for that as scheduled.

## 2022-07-05 NOTE — ED Notes (Signed)
Pt originally declined dilaudid but now wants to try it due to pain increasing. Medicated per orders

## 2022-07-05 NOTE — H&P (Deleted)
[name] 1950/03/25  [id number].    Requesting MD: Renne Crigler, PA-C Chief Complaint/Reason for Consult: abdominal pain, possible appendicitis  HPI:  [name] is a 72 yo female resenting to the ED with worsening diarrhea, vomiting, and abdominal pain.  She began having diarrhea 2 nights ago which persisted.  She has also had cramping abdominal pain in the lower abdomen, which is primarily in the left lower quadrant.  She denies periumbilical or right lower quadrant pain.  She has had chills but no documented fevers at home.  She has also had vomiting today.  Labs in the ED are significant for WBC of 15.  A CT scan showed mild enlargement of the appendix to 11 mm, concerning for possible appendicitis.  She was also incidentally noted to have a 2.4 cm soft tissue mass in the left lower quadrant.  General surgery was consulted to evaluate for appendicitis  She recently had an L4-L5 hemilaminectomy on 4/17 and says she recovered well.  ROS: Review of Systems  Constitutional:  Negative for chills and fever.  Respiratory:  Negative for shortness of breath and stridor.   Gastrointestinal:  Positive for abdominal pain, diarrhea, nausea and vomiting.    Family History  Problem Relation Age of Onset   Rheum arthritis Mother 80   Leukemia Mother    Non-Hodgkin's lymphoma Sister    Other Father 39       accident    Past Medical History:  Diagnosis Date   Anal fissure    Breast cancer, right breast (HCC) 11/1997   S/P lumphectomy, chemo,  and radiation therapy   Broken arm 08/2014   right   Hypercholesteremia    Hypothyroidism    Osteoarthritis    "knees; maybe in my lower back" (04/23/2017)   Personal history of chemotherapy    Personal history of radiation therapy    PONV (postoperative nausea and vomiting)    Thyroid disease    hypothyroid    Past Surgical History:  Procedure Laterality Date   BLADDER SUSPENSION  02/2002   sparc   BREAST BIOPSY Right 1999   BREAST  LUMPECTOMY Right 11/1997   EYE SURGERY Bilateral    bilateral cataract removal   LUMBAR LAMINECTOMY/DECOMPRESSION MICRODISCECTOMY Left 06/20/2022   Procedure: Left Lumbar Four-Five Hemilaminectomy with resection of synovial cyst;  Surgeon: Tia Alert, MD;  Location: [facility] OR;  Service: Neurosurgery;  Laterality: Left;  3C   MOUTH SURGERY     "dental implants"   NASAL SINUS SURGERY  12/2015   TOTAL KNEE ARTHROPLASTY Right 04/22/2017   Procedure: TOTAL KNEE ARTHROPLASTY;  Surgeon: Gean Birchwood, MD;  Location: [facility] OR;  Service: Orthopedics;  Laterality: Right;   TOTAL KNEE ARTHROPLASTY Left 04/2019   TUBAL LIGATION     VAGINAL HYSTERECTOMY  02/2002   LAVH-BSO    Social History:  reports that she has never smoked. She has never used smokeless tobacco. She reports current alcohol use of about 6.0 - 8.0 standard drinks of alcohol per week. She reports that she does not use drugs.  Allergies:  Allergies  Allergen Reactions   Gabapentin Other (See Comments)    Gave pt migraine    (Not in a hospital admission)    Physical Exam: Blood pressure (!) 158/81, pulse 81, temperature 97.9 F (36.6 C), temperature source Oral, resp. rate 20, last menstrual period 03/05/1997, SpO2 100 %. General: resting comfortably, appears stated age, no apparent distress Neurological: alert and oriented, no focal deficits HEENT: normocephalic,  atraumatic CV: regular rate and rhythm, extremities warm and well-perfused Respiratory: normal work of breathing on room air Abdomen: soft, nondistended, no tenderness to palpation.  Specifically, no right lower quadrant tenderness to deep palpation. Extremities: warm and well-perfused, no deformities, moving all extremities spontaneously Psychiatric: normal mood and affect Skin: warm and dry, no jaundice, no rashes or lesions   Results for orders placed or performed during the hospital encounter of 07/05/22 (from the past 48 hour(s))  Comprehensive metabolic panel      Status: Abnormal   Collection Time: 07/05/22  4:34 PM  Result Value Ref Range   Sodium 132 (L) 135 - 145 mmol/L   Potassium 3.1 (L) 3.5 - 5.1 mmol/L   Chloride 98 98 - 111 mmol/L   CO2 17 (L) 22 - 32 mmol/L   Glucose, Bld 131 (H) 70 - 99 mg/dL    Comment: Glucose reference range applies only to samples taken after fasting for at least 8 hours.   BUN 13 8 - 23 mg/dL   Creatinine, Ser 9.14 0.44 - 1.00 mg/dL   Calcium 9.6 8.9 - 78.2 mg/dL   Total Protein 7.4 6.5 - 8.1 g/dL   Albumin 4.4 3.5 - 5.0 g/dL   AST 50 (H) 15 - 41 U/L   ALT 22 0 - 44 U/L   Alkaline Phosphatase 45 38 - 126 U/L   Total Bilirubin 0.9 0.3 - 1.2 mg/dL   GFR, Estimated >95 >62 mL/min    Comment: (NOTE) Calculated using the CKD-EPI Creatinine Equation (2021)    Anion gap 17 (H) 5 - 15    Comment: Performed at [facility], [address] [place], [place] [address]  CBC with Differential     Status: Abnormal   Collection Time: 07/05/22  4:34 PM  Result Value Ref Range   WBC 15.6 (H) 4.0 - 10.5 K/uL   RBC 4.21 3.87 - 5.11 MIL/uL   Hemoglobin 13.6 12.0 - 15.0 g/dL   HCT 57.8 46.9 - 62.9 %   MCV 90.7 80.0 - 100.0 fL   MCH 32.3 26.0 - 34.0 pg   MCHC 35.6 30.0 - 36.0 g/dL   RDW 52.8 41.3 - 24.4 %   Platelets 298 150 - 400 K/uL   nRBC 0.0 0.0 - 0.2 %   Neutrophils Relative % 89 %   Neutro Abs 13.7 (H) 1.7 - 7.7 K/uL   Lymphocytes Relative 7 %   Lymphs Abs 1.0 0.7 - 4.0 K/uL   Monocytes Relative 4 %   Monocytes Absolute 0.7 0.1 - 1.0 K/uL   Eosinophils Relative 0 %   Eosinophils Absolute 0.0 0.0 - 0.5 K/uL   Basophils Relative 0 %   Basophils Absolute 0.1 0.0 - 0.1 K/uL   Immature Granulocytes 0 %   Abs Immature Granulocytes 0.07 0.00 - 0.07 K/uL    Comment: Performed at [facility], [address] [place], [place] [address]  Lipase, blood     Status: None   Collection Time: 07/05/22  4:34 PM  Result Value Ref Range   Lipase 47 11 - 51 U/L    Comment: Performed at [facility],  [address], [place], [place] [address]  Urinalysis, Routine w reflex microscopic -Urine, Clean Catch     Status: Abnormal   Collection Time: 07/05/22  6:28 PM  Result Value Ref Range   Color, Urine YELLOW YELLOW   APPearance CLEAR CLEAR   Specific Gravity, Urine 1.028 1.005 - 1.030  pH 6.0 5.0 - 8.0   Glucose, UA NEGATIVE NEGATIVE mg/dL   Hgb urine dipstick NEGATIVE NEGATIVE   Bilirubin Urine NEGATIVE NEGATIVE   Ketones, ur 80 (A) NEGATIVE mg/dL   Protein, ur NEGATIVE NEGATIVE mg/dL   Nitrite NEGATIVE NEGATIVE   Leukocytes,Ua NEGATIVE NEGATIVE    Comment: Performed at [facility], [address] [place], [place] [address]  Magnesium     Status: Abnormal   Collection Time: 07/05/22  6:55 PM  Result Value Ref Range   Magnesium 1.6 (L) 1.7 - 2.4 mg/dL    Comment: Performed at [facility], [address], [place], [place] [address]   CT ABDOMEN PELVIS W CONTRAST  Result Date: 07/05/2022 CLINICAL DATA:  Abdominal pain EXAM: CT ABDOMEN AND PELVIS WITH CONTRAST TECHNIQUE: Multidetector CT imaging of the abdomen and pelvis was performed using the standard protocol following bolus administration of intravenous contrast. RADIATION DOSE REDUCTION: This exam was performed according to the departmental dose-optimization program which includes automated exposure control, adjustment of the mA and/or kV according to patient size and/or use of iterative reconstruction technique. CONTRAST:  75mL OMNIPAQUE IOHEXOL 350 MG/ML SOLN COMPARISON:  None Available. FINDINGS: Lower chest: No acute abnormality. Hepatobiliary: No focal liver abnormality is seen. No gallstones, gallbladder wall thickening, or biliary dilatation. Pancreas: Unremarkable. No pancreatic ductal dilatation or surrounding inflammatory changes. Spleen: 5 mm cystic focus along the inferior aspect of the spleen. Benign. No follow up. Spleen is not enlarged. Adrenals/Urinary Tract: Adrenal glands are unremarkable. Kidneys are normal,  without renal calculi, or hydronephrosis. A few tiny cystic lesions identified bilateral, likely small cysts, less than 1 cm and too small to completely characterize. Bosniak 2. No follow up. Bladder is unremarkable. Stomach/Bowel: There is some debris in the stomach. There is high attenuation material in the stomach with questionable lesion in the midbody with a caliber change. Focal wall thickening in this location as well. This stomach also has some slight wall thickening. Stomach is underdistended. Small bowel is nondilated. Large bowel is nondilated. Scattered colonic diverticula. There is a rounded soft tissue mass in the left hemipelvis measuring 2.4 by 2.2 by 2.3 cm. This abuts a small bowel loop. Possibility would include a exophytic mass from the wall of the loop of bowel such as a gastrointestinal stromal tumor. There are other differential points as well. Please correlate with any prior or further workup is recommended. The appendix extends posterior to the cecum in the right hemipelvis but is dilated up to 11 mm on axial series 3, image 57. Sagittal series 7, image 50. Please correlate with any clinical presentation of acute appendicitis. Vascular/Lymphatic: Normal caliber aorta and IVC. Mild vascular calcifications. No specific abnormal lymph node enlargement identified in the abdomen and pelvis. Reproductive: Absence of the uterus. Ovaries are poorly seen. Please correlate with clinical history. Other: Mild anasarca.  No free intra-air or free fluid. Musculoskeletal: Scattered degenerative changes of the spine and pelvis. Patient is status post left L4-5 hemilaminectomy. Involving the surgical area is a rim enhancing fluid collection. This extends into the central canal where there is a component of fluid seen narrowing of the thecal sac. Fluid along the canal extends from L4-5 down to S1. Please see sagittal series 7, image 66, axial series 3, image 35. This collection is without gas. Dimensions are  measured at a proximally 7.8 by 3.5 by 5.3 cm. IMPRESSION: Enlarged appendix in the right hemipelvis extending posterior to the cecum measuring up to 11 mm.  Minimal stranding. Please correlate for any clinical evidence of appendicitis. Soft tissue mass in the left hemipelvis measuring 2.4 cm. This abuts several adjacent small bowel loops. The uterus is not seen. The ovaries are poorly defined. This mass would have a differential including bowel pathology such as a stromal tumor. There is a differential however including gynecologic etiologies. Please correlate with any prior imaging or dedicated workup when appropriate. Rim enhancing fluid collection identified at the lumbar spine posteriorly involving the subcutaneous tissues, musculature and extending into the central canal of the lumbar spine with potential mass effect along the thecal sac. Please correlate for any neurologic symptoms. This very well could be simply postoperative. There is no associated gas but in principle infection can not be excluded by CT alone. If there is further concern and additional characterization is indicated, MRI of the lumbar spine with and without contrast may be of benefit. Slightly atypical configuration of the midbody of the stomach with caliber change and focal thickening. Distal stomach also has more smooth wall thickening. Appears could be peristalsis. Please correlate for any specific gastric symptoms. Further workup when clinically appropriate with the oral contrast exam. Critical Value/emergent results were called by telephone at the time of interpretation on 07/05/2022 at 4:48 pm to provider Maryanna Shape , who verbally acknowledged these results. Electronically Signed   By: Karen Kays M.D.   On: 07/05/2022 19:53      Assessment/Plan This is a 72 year old female presenting with diarrhea, nausea, vomiting, and left lower quadrant abdominal pain, as well as leukocytosis.  I personally reviewed her labs, imaging, and  notes.  Her appendix is very mildly dilated and fluid-filled on her CT scan, however her clinical history is not typical of appendicitis.  In addition, she is not at all tender in the right lower quadrant on exam.  I would favor another diagnosis such as viral gastroenteritis over appendicitis.  However I agree with empiric antibiotics.  We will examine the patient again in the morning to reassess her symptoms.  She is happy to avoid surgery for now.  Incidentally she also had a mass noted in the left hemipelvis on her CT scan.  This is unlikely to be contributing to her symptoms.  This appears to be associated with the small bowel but it is difficult to determine definitively.  This may represent a GIST.  She will need further workup of this and likely an MRI.   Sophronia Simas, MD [facility] Surgery General, Hepatobiliary and Pancreatic Surgery 07/05/22 10:24 PM

## 2022-07-06 DIAGNOSIS — E785 Hyperlipidemia, unspecified: Secondary | ICD-10-CM

## 2022-07-06 DIAGNOSIS — E876 Hypokalemia: Secondary | ICD-10-CM

## 2022-07-06 DIAGNOSIS — E039 Hypothyroidism, unspecified: Secondary | ICD-10-CM

## 2022-07-06 DIAGNOSIS — K529 Noninfective gastroenteritis and colitis, unspecified: Secondary | ICD-10-CM

## 2022-07-06 DIAGNOSIS — Z9889 Other specified postprocedural states: Secondary | ICD-10-CM

## 2022-07-06 DIAGNOSIS — R1900 Intra-abdominal and pelvic swelling, mass and lump, unspecified site: Secondary | ICD-10-CM

## 2022-07-06 DIAGNOSIS — E871 Hypo-osmolality and hyponatremia: Secondary | ICD-10-CM | POA: Insufficient documentation

## 2022-07-06 LAB — CBC
HCT: 32.5 % — ABNORMAL LOW (ref 36.0–46.0)
Hemoglobin: 11.6 g/dL — ABNORMAL LOW (ref 12.0–15.0)
MCH: 32.8 pg (ref 26.0–34.0)
MCHC: 35.7 g/dL (ref 30.0–36.0)
MCV: 91.8 fL (ref 80.0–100.0)
Platelets: 235 10*3/uL (ref 150–400)
RBC: 3.54 MIL/uL — ABNORMAL LOW (ref 3.87–5.11)
RDW: 12.7 % (ref 11.5–15.5)
WBC: 14.4 10*3/uL — ABNORMAL HIGH (ref 4.0–10.5)
nRBC: 0 % (ref 0.0–0.2)

## 2022-07-06 LAB — COMPREHENSIVE METABOLIC PANEL
ALT: 19 U/L (ref 0–44)
AST: 39 U/L (ref 15–41)
Albumin: 3.6 g/dL (ref 3.5–5.0)
Alkaline Phosphatase: 37 U/L — ABNORMAL LOW (ref 38–126)
Anion gap: 9 (ref 5–15)
BUN: 9 mg/dL (ref 8–23)
CO2: 20 mmol/L — ABNORMAL LOW (ref 22–32)
Calcium: 7.7 mg/dL — ABNORMAL LOW (ref 8.9–10.3)
Chloride: 105 mmol/L (ref 98–111)
Creatinine, Ser: 0.73 mg/dL (ref 0.44–1.00)
GFR, Estimated: >60 mL/min (ref >60)
Glucose, Bld: 109 mg/dL — ABNORMAL HIGH (ref 70–99)
Potassium: 3.7 mmol/L (ref 3.5–5.1)
Sodium: 134 mmol/L — ABNORMAL LOW (ref 135–145)
Total Bilirubin: 0.7 mg/dL (ref 0.3–1.2)
Total Protein: 6.5 g/dL (ref 6.5–8.1)

## 2022-07-06 LAB — MAGNESIUM: Magnesium: 2.1 mg/dL (ref 1.7–2.4)

## 2022-07-06 LAB — PHOSPHORUS: Phosphorus: 2.9 mg/dL (ref 2.5–4.6)

## 2022-07-06 MED ORDER — HYDROMORPHONE HCL 1 MG/ML IJ SOLN
0.5000 mg | Freq: Once | INTRAMUSCULAR | Status: AC
  Administered 2022-07-06: 0.5 mg via INTRAVENOUS
  Filled 2022-07-06: qty 0.5

## 2022-07-06 MED ORDER — OXYCODONE HCL 5 MG PO TABS
5.0000 mg | ORAL_TABLET | ORAL | Status: DC | PRN
  Administered 2022-07-06 (×2): 5 mg via ORAL
  Filled 2022-07-06 (×2): qty 1

## 2022-07-06 MED ORDER — CALCIUM GLUCONATE-NACL 1-0.675 GM/50ML-% IV SOLN
1.0000 g | Freq: Once | INTRAVENOUS | Status: AC
  Administered 2022-07-06: 1000 mg via INTRAVENOUS
  Filled 2022-07-06: qty 50

## 2022-07-06 MED ORDER — HYDROMORPHONE HCL 1 MG/ML IJ SOLN
1.0000 mg | INTRAMUSCULAR | Status: DC | PRN
  Administered 2022-07-06 – 2022-07-07 (×4): 1 mg via INTRAVENOUS
  Filled 2022-07-06 (×4): qty 1

## 2022-07-06 MED ORDER — DIPHENHYDRAMINE HCL 25 MG PO CAPS
25.0000 mg | ORAL_CAPSULE | Freq: Once | ORAL | Status: AC
  Administered 2022-07-06: 25 mg via ORAL
  Filled 2022-07-06: qty 1

## 2022-07-06 MED ORDER — ALPRAZOLAM 0.5 MG PO TABS
0.5000 mg | ORAL_TABLET | Freq: Once | ORAL | Status: AC
  Administered 2022-07-06: 0.5 mg via ORAL
  Filled 2022-07-06: qty 1

## 2022-07-06 NOTE — Progress Notes (Signed)
  Progress Note   Patient: [name] ZOX:[id number] DOB: April 11, 1950 DOA: 07/05/2022     1 DOS: the patient was seen and examined on 07/06/2022        Brief hospital course: Mrs. [name] is a 72 y.o. F with hx BrCA >20 yrs no longer in surveillance, hypothyroidism and recent L4-5 hemilaminectomy for synovial cyst resection who presented with 2 days vomiting, diarrhea and LLQ pain.  In the ER, CT showed dilated appendix, new LLQ soft tissue mass, and small thecal sac by surgical site.     Assessment and Plan: * Colitis Patient with diarrhea and abdominal pain, minimal vomiting.  Abdominal tenderness localized to left yesterday, more diffuse today.  CT abdomen and pelvis shows no SBO, ischemia, no perforation, no abscess, no discitis, no hematoma/hemorrhage.  Serial abdominal exams by Surgery, they are confident this is not appendicitis.  Given recent surgery, periop antibiotics, WBC, pain and frequent watery BM, cannot rule out Cdiff - Hold antibiotics - Obtain stool studies - Continue IV fluids - Soft diet    Hypocalcemia - Supp Ca  Hypokalemia - Supplement K  L4-L5 hemilaminectomy with resection of synovial cyst on 06/20/22 EDP discussed with Neurosurgery  Hyperlipidemia - Continue simvastatin   Soft tissue mass in left hemipelvis Suspect this is incidental. - Obtain MRI abdomen  Hypomagnesemia - Supp Mag  Hyponatremia Na mildly low - Continue IV fliuds for now  Hypothyroidism, acquired - Continue levothyroxine          Subjective: Patient still with severe diffuse pain.  No bowel movements in the last few hours.  No fever, no hematochezia, no confusion.    Physical Exam: BP (!) 157/78 (BP Location: Left Arm)   Pulse 86   Temp 98.1 F (36.7 C) (Oral)   Resp 18   LMP 03/05/1997   SpO2 99%   Elderly adult female, lying in bed, appears very uncomfortable, keeps eyes closed RRR, no murmurs, no peripheral edema Respiratory rate normal, lungs clear  without rales or wheezes Abdomen with voluntary guarding throughout, no pain to palpation in all quadrants, does not really localize on my exam, no rigidity or rebound, no peritonitis, no masses Attention limited by pain, affect blunted, face symmetric, speech fluent, moves upper extremities with normal strength and coordination, XTRAC 5/5 bilaterally, normal sensation to bilateral lower extremities    Data Reviewed: Discussed with general surgery team Patient metabolic panel shows mild hyponatremia, calcium 7.7, magnesium 1.6 CBC shows mild leukocytosis  Family Communication: Husband at the bedside    Disposition: Status is: Inpatient         Author: Alberteen Sam, MD 07/06/2022 2:59 PM  For on call review [email or web address].

## 2022-07-06 NOTE — Assessment & Plan Note (Signed)
Continue levothyroxine 

## 2022-07-06 NOTE — Assessment & Plan Note (Signed)
EDP discussed with Neurosurgery

## 2022-07-06 NOTE — Assessment & Plan Note (Signed)
Stable, asymptomatic. 

## 2022-07-06 NOTE — Hospital Course (Signed)
Mrs. Raggs is a 72 y.o. F with hx BrCA >20 yrs no longer in surveillance, hypothyroidism and recent L4-5 hemilaminectomy for synovial cyst resection who presented with 2 days vomiting, diarrhea and LLQ pain.  In the ER, CT showed dilated appendix, new LLQ soft tissue mass, and small thecal sac by surgical site.

## 2022-07-06 NOTE — Assessment & Plan Note (Signed)
Continue simvastatin. 

## 2022-07-06 NOTE — Assessment & Plan Note (Signed)
Supplemented 

## 2022-07-06 NOTE — ED Notes (Signed)
ED TO INPATIENT HANDOFF REPORT  ED Nurse Name and Phone #: [phone]  S Name/Age/Gender [name] 72 y.o. female Room/Bed: 035C/035C  Code Status   Code Status: Full Code  Home/SNF/Other Home Patient oriented to: self, place, time, and situation Is this baseline? Yes   Triage Complete: Triage complete  Chief Complaint Abdominal pain [R10.9]  Triage Note Pt with diffuse abdominal pain and diarrhea since Tuesday night .   Pt reports yesterday taking immodium and pepto and the diarrhea slowed but today has had severe cramps and vomiting.  Pt had back surgery a couple of weeks ago and reports good recovery from that with no complications and has had her follow up for that as scheduled.     Allergies Allergies  Allergen Reactions   Gabapentin Other (See Comments)    Gave pt migraine    Level of Care/Admitting Diagnosis ED Disposition     ED Disposition  Admit   Condition  --   Comment  Hospital Area: [facility] [[id number]]  Level of Care: Telemetry Surgical [[id number]]  May admit patient to [facility] or [facility] if equivalent level of care is available:: Yes  Covid Evaluation: Asymptomatic - no recent exposure (last 10 days) testing not required  Diagnosis: Abdominal pain [161096]  Admitting Physician: Darlin Drop [[id number]]  Attending Physician: Darlin Drop [[id number]]  Certification:: I certify this patient will need inpatient services for at least 2 midnights  Estimated Length of Stay: 2          B Medical/Surgery History Past Medical History:  Diagnosis Date   Anal fissure    Breast cancer, right breast (HCC) 11/1997   S/P lumphectomy, chemo,  and radiation therapy   Broken arm 08/2014   right   Hypercholesteremia    Hypothyroidism    Osteoarthritis    "knees; maybe in my lower back" (04/23/2017)   Personal history of chemotherapy    Personal history of radiation therapy    PONV (postoperative nausea and vomiting)    Thyroid  disease    hypothyroid   Past Surgical History:  Procedure Laterality Date   BLADDER SUSPENSION  02/2002   sparc   BREAST BIOPSY Right 1999   BREAST LUMPECTOMY Right 11/1997   EYE SURGERY Bilateral    bilateral cataract removal   LUMBAR LAMINECTOMY/DECOMPRESSION MICRODISCECTOMY Left 06/20/2022   Procedure: Left Lumbar Four-Five Hemilaminectomy with resection of synovial cyst;  Surgeon: Tia Alert, MD;  Location: [facility] OR;  Service: Neurosurgery;  Laterality: Left;  3C   MOUTH SURGERY     "dental implants"   NASAL SINUS SURGERY  12/2015   TOTAL KNEE ARTHROPLASTY Right 04/22/2017   Procedure: TOTAL KNEE ARTHROPLASTY;  Surgeon: Gean Birchwood, MD;  Location: [facility] OR;  Service: Orthopedics;  Laterality: Right;   TOTAL KNEE ARTHROPLASTY Left 04/2019   TUBAL LIGATION     VAGINAL HYSTERECTOMY  02/2002   LAVH-BSO     A IV Location/Drains/Wounds Patient Lines/Drains/Airways Status     Active Line/Drains/Airways     Name Placement date Placement time Site Days   Peripheral IV 07/05/22 20 G Left Antecubital 07/05/22  1656  Antecubital  1   Peripheral IV 07/05/22 20 G 1" Left;Posterior Forearm 07/05/22  2245  Forearm  1            Intake/Output Last 24 hours  Intake/Output Summary (Last 24 hours) at 07/06/2022 0014 Last data filed at 07/05/2022 2347 Gross per 24 hour  Intake  1137.17 ml  Output --  Net 1137.17 ml    Labs/Imaging Results for orders placed or performed during the hospital encounter of 07/05/22 (from the past 48 hour(s))  Comprehensive metabolic panel     Status: Abnormal   Collection Time: 07/05/22  4:34 PM  Result Value Ref Range   Sodium 132 (L) 135 - 145 mmol/L   Potassium 3.1 (L) 3.5 - 5.1 mmol/L   Chloride 98 98 - 111 mmol/L   CO2 17 (L) 22 - 32 mmol/L   Glucose, Bld 131 (H) 70 - 99 mg/dL    Comment: Glucose reference range applies only to samples taken after fasting for at least 8 hours.   BUN 13 8 - 23 mg/dL   Creatinine, Ser 1.61 0.44 - 1.00 mg/dL    Calcium 9.6 8.9 - 09.6 mg/dL   Total Protein 7.4 6.5 - 8.1 g/dL   Albumin 4.4 3.5 - 5.0 g/dL   AST 50 (H) 15 - 41 U/L   ALT 22 0 - 44 U/L   Alkaline Phosphatase 45 38 - 126 U/L   Total Bilirubin 0.9 0.3 - 1.2 mg/dL   GFR, Estimated >04 >54 mL/min    Comment: (NOTE) Calculated using the CKD-EPI Creatinine Equation (2021)    Anion gap 17 (H) 5 - 15    Comment: Performed at [facility], [address], [place], [place] [address]  CBC with Differential     Status: Abnormal   Collection Time: 07/05/22  4:34 PM  Result Value Ref Range   WBC 15.6 (H) 4.0 - 10.5 K/uL   RBC 4.21 3.87 - 5.11 MIL/uL   Hemoglobin 13.6 12.0 - 15.0 g/dL   HCT 91.4 78.2 - 95.6 %   MCV 90.7 80.0 - 100.0 fL   MCH 32.3 26.0 - 34.0 pg   MCHC 35.6 30.0 - 36.0 g/dL   RDW 21.3 08.6 - 57.8 %   Platelets 298 150 - 400 K/uL   nRBC 0.0 0.0 - 0.2 %   Neutrophils Relative % 89 %   Neutro Abs 13.7 (H) 1.7 - 7.7 K/uL   Lymphocytes Relative 7 %   Lymphs Abs 1.0 0.7 - 4.0 K/uL   Monocytes Relative 4 %   Monocytes Absolute 0.7 0.1 - 1.0 K/uL   Eosinophils Relative 0 %   Eosinophils Absolute 0.0 0.0 - 0.5 K/uL   Basophils Relative 0 %   Basophils Absolute 0.1 0.0 - 0.1 K/uL   Immature Granulocytes 0 %   Abs Immature Granulocytes 0.07 0.00 - 0.07 K/uL    Comment: Performed at [facility], [address], [place], [place] [address]  Lipase, blood     Status: None   Collection Time: 07/05/22  4:34 PM  Result Value Ref Range   Lipase 47 11 - 51 U/L    Comment: Performed at [facility], [address], [place], [place] [address]  Urinalysis, Routine w reflex microscopic -Urine, Clean Catch     Status: Abnormal   Collection Time: 07/05/22  6:28 PM  Result Value Ref Range   Color, Urine YELLOW YELLOW   APPearance CLEAR CLEAR   Specific Gravity, Urine 1.028 1.005 - 1.030   pH 6.0 5.0 - 8.0   Glucose, UA NEGATIVE NEGATIVE mg/dL   Hgb urine dipstick NEGATIVE NEGATIVE   Bilirubin Urine NEGATIVE NEGATIVE    Ketones, ur 80 (A) NEGATIVE mg/dL   Protein, ur NEGATIVE NEGATIVE mg/dL   Nitrite NEGATIVE NEGATIVE  Leukocytes,Ua NEGATIVE NEGATIVE    Comment: Performed at [facility], [address] [place], [place] [address]  Magnesium     Status: Abnormal   Collection Time: 07/05/22  6:55 PM  Result Value Ref Range   Magnesium 1.6 (L) 1.7 - 2.4 mg/dL    Comment: Performed at [facility], [address], [place], [place] [address]   CT ABDOMEN PELVIS W CONTRAST  Result Date: 07/05/2022 CLINICAL DATA:  Abdominal pain EXAM: CT ABDOMEN AND PELVIS WITH CONTRAST TECHNIQUE: Multidetector CT imaging of the abdomen and pelvis was performed using the standard protocol following bolus administration of intravenous contrast. RADIATION DOSE REDUCTION: This exam was performed according to the departmental dose-optimization program which includes automated exposure control, adjustment of the mA and/or kV according to patient size and/or use of iterative reconstruction technique. CONTRAST:  75mL OMNIPAQUE IOHEXOL 350 MG/ML SOLN COMPARISON:  None Available. FINDINGS: Lower chest: No acute abnormality. Hepatobiliary: No focal liver abnormality is seen. No gallstones, gallbladder wall thickening, or biliary dilatation. Pancreas: Unremarkable. No pancreatic ductal dilatation or surrounding inflammatory changes. Spleen: 5 mm cystic focus along the inferior aspect of the spleen. Benign. No follow up. Spleen is not enlarged. Adrenals/Urinary Tract: Adrenal glands are unremarkable. Kidneys are normal, without renal calculi, or hydronephrosis. A few tiny cystic lesions identified bilateral, likely small cysts, less than 1 cm and too small to completely characterize. Bosniak 2. No follow up. Bladder is unremarkable. Stomach/Bowel: There is some debris in the stomach. There is high attenuation material in the stomach with questionable lesion in the midbody with a caliber change. Focal wall thickening in this location as  well. This stomach also has some slight wall thickening. Stomach is underdistended. Small bowel is nondilated. Large bowel is nondilated. Scattered colonic diverticula. There is a rounded soft tissue mass in the left hemipelvis measuring 2.4 by 2.2 by 2.3 cm. This abuts a small bowel loop. Possibility would include a exophytic mass from the wall of the loop of bowel such as a gastrointestinal stromal tumor. There are other differential points as well. Please correlate with any prior or further workup is recommended. The appendix extends posterior to the cecum in the right hemipelvis but is dilated up to 11 mm on axial series 3, image 57. Sagittal series 7, image 50. Please correlate with any clinical presentation of acute appendicitis. Vascular/Lymphatic: Normal caliber aorta and IVC. Mild vascular calcifications. No specific abnormal lymph node enlargement identified in the abdomen and pelvis. Reproductive: Absence of the uterus. Ovaries are poorly seen. Please correlate with clinical history. Other: Mild anasarca.  No free intra-air or free fluid. Musculoskeletal: Scattered degenerative changes of the spine and pelvis. Patient is status post left L4-5 hemilaminectomy. Involving the surgical area is a rim enhancing fluid collection. This extends into the central canal where there is a component of fluid seen narrowing of the thecal sac. Fluid along the canal extends from L4-5 down to S1. Please see sagittal series 7, image 66, axial series 3, image 35. This collection is without gas. Dimensions are measured at a proximally 7.8 by 3.5 by 5.3 cm. IMPRESSION: Enlarged appendix in the right hemipelvis extending posterior to the cecum measuring up to 11 mm. Minimal stranding. Please correlate for any clinical evidence of appendicitis. Soft tissue mass in the left hemipelvis measuring 2.4 cm. This abuts several adjacent small bowel loops. The uterus is not seen. The ovaries are poorly defined. This mass would have a  differential including bowel pathology such  as a stromal tumor. There is a differential however including gynecologic etiologies. Please correlate with any prior imaging or dedicated workup when appropriate. Rim enhancing fluid collection identified at the lumbar spine posteriorly involving the subcutaneous tissues, musculature and extending into the central canal of the lumbar spine with potential mass effect along the thecal sac. Please correlate for any neurologic symptoms. This very well could be simply postoperative. There is no associated gas but in principle infection can not be excluded by CT alone. If there is further concern and additional characterization is indicated, MRI of the lumbar spine with and without contrast may be of benefit. Slightly atypical configuration of the midbody of the stomach with caliber change and focal thickening. Distal stomach also has more smooth wall thickening. Appears could be peristalsis. Please correlate for any specific gastric symptoms. Further workup when clinically appropriate with the oral contrast exam. Critical Value/emergent results were called by telephone at the time of interpretation on 07/05/2022 at 4:48 pm to provider Maryanna Shape , who verbally acknowledged these results. Electronically Signed   By: Karen Kays M.D.   On: 07/05/2022 19:53    Pending Labs Unresulted Labs (From admission, onward)     Start     Ordered   07/06/22 0500  CBC  Tomorrow morning,   R        07/05/22 2209   07/06/22 0500  Comprehensive metabolic panel  Tomorrow morning,   R        07/05/22 2209   07/06/22 0500  Magnesium  Tomorrow morning,   R        07/05/22 2209   07/06/22 0500  Phosphorus  Tomorrow morning,   R        07/05/22 2209            Vitals/Pain Today's Vitals   07/05/22 2100 07/05/22 2200 07/05/22 2236 07/06/22 0000  BP: (!) 168/83 (!) 158/81  (!) 160/82  Pulse: 92 81  87  Resp: 16 20  20   Temp:      TempSrc:      SpO2: 99% 100%  98%  PainSc:    10-Worst pain ever     Isolation Precautions No active isolations  Medications Medications  morphine (PF) 4 MG/ML injection 4 mg (4 mg Intravenous Given 07/05/22 2149)  HYDROmorphone (DILAUDID) injection 0.5 mg (0.5 mg Intravenous Patient Refused/Not Given 07/05/22 2207)  acetaminophen (TYLENOL) tablet 650 mg (has no administration in time range)  oxyCODONE (Oxy IR/ROXICODONE) immediate release tablet 5 mg (5 mg Oral Given 07/05/22 2305)  HYDROmorphone (DILAUDID) injection 0.5 mg (0.5 mg Intravenous Given 07/05/22 2235)  metroNIDAZOLE (FLAGYL) IVPB 500 mg (has no administration in time range)  cefTRIAXone (ROCEPHIN) 2 g in sodium chloride 0.9 % 100 mL IVPB (0 g Intravenous Stopped 07/05/22 2347)  0.9 % NaCl with KCl 40 mEq / L  infusion ( Intravenous New Bag/Given 07/05/22 2310)  simvastatin (ZOCOR) tablet 20 mg (has no administration in time range)  levothyroxine (SYNTHROID) tablet 75 mcg (has no administration in time range)  prochlorperazine (COMPAZINE) injection 5 mg (has no administration in time range)  melatonin tablet 5 mg (has no administration in time range)  polyethylene glycol (MIRALAX / GLYCOLAX) packet 17 g (has no administration in time range)  ondansetron (ZOFRAN) injection 4 mg (4 mg Intravenous Given 07/05/22 1657)  HYDROmorphone (DILAUDID) injection 0.5 mg (0.5 mg Intravenous Given 07/05/22 1658)  HYDROmorphone (DILAUDID) injection 0.5 mg (0.5 mg Intravenous Given 07/05/22 1826)  ondansetron (ZOFRAN) injection  4 mg (4 mg Intravenous Given 07/05/22 1826)  iohexol (OMNIPAQUE) 350 MG/ML injection 75 mL (75 mLs Intravenous Contrast Given 07/05/22 1907)  sodium chloride 0.9 % bolus 1,000 mL (0 mLs Intravenous Stopped 07/05/22 2235)  magnesium sulfate IVPB 2 g 50 mL (0 g Intravenous Stopped 07/05/22 2256)    Mobility walks     Focused Assessments Cardiac Assessment Handoff:  Cardiac Rhythm: Normal sinus rhythm Lab Results  Component Value Date   CKTOTAL 95 07/04/2016   No results found  for: "DDIMER" Does the Patient currently have chest pain? No   , Neuro Assessment Handoff:  Swallow screen pass? Yes  Cardiac Rhythm: Normal sinus rhythm       Neuro Assessment:   Neuro Checks:      Has TPA been given? No If patient is a Neuro Trauma and patient is going to OR before floor call report to 4N Charge nurse: [phone] or [phone]  , Pulmonary Assessment Handoff:  Lung sounds: Bilateral Breath Sounds: Clear O2 Device: Room Air      R Recommendations: See Admitting Provider Note  Report given to:   Additional Notes: n/a

## 2022-07-06 NOTE — Progress Notes (Signed)
Progress Note     Subjective: Pt reports cramping lower abdominal pain but not worse than presentation. Denies nausea or vomiting. No further diarrhea. She was up all night with IV pump beeping. Husband at bedside this AM as well. She prefers not to have abdominal surgery at this time if not needed.   Objective: Vital signs in last 24 hours: Temp:  [97.9 F (36.6 C)-98.3 F (36.8 C)] 98.1 F (36.7 C) (05/03 0732) Pulse Rate:  [57-92] 86 (05/03 0732) Resp:  [10-32] 18 (05/03 0732) BP: (129-215)/(57-83) 157/78 (05/03 0732) SpO2:  [96 %-100 %] 99 % (05/03 0732)    Intake/Output from previous day: 05/02 0701 - 05/03 0700 In: 1569.7 [P.O.:240; I.V.:192.6; IV Piggyback:1137.2] Out: -  Intake/Output this shift: No intake/output data recorded.  PE: General: pleasant, WD, WN female who is laying in bed in NAD Heart: regular, rate, and rhythm.   Lungs: Respiratory effort nonlabored Abd: soft, mild ttp across lower abdomen without peritonitis, ND, +BS, no masses, hernias, or organomegaly MS: all 4 extremities are symmetrical with no cyanosis, clubbing, or edema. Psych: A&Ox3 with an appropriate affect.    Lab Results:  Recent Labs    07/05/22 1634 07/06/22 0406  WBC 15.6* 14.4*  HGB 13.6 11.6*  HCT 38.2 32.5*  PLT 298 235   BMET Recent Labs    07/05/22 1634 07/06/22 0406  NA 132* 134*  K 3.1* 3.7  CL 98 105  CO2 17* 20*  GLUCOSE 131* 109*  BUN 13 9  CREATININE 0.87 0.73  CALCIUM 9.6 7.7*   PT/INR No results for input(s): "LABPROT", "INR" in the last 72 hours. CMP     Component Value Date/Time   NA 134 (L) 07/06/2022 0406   K 3.7 07/06/2022 0406   CL 105 07/06/2022 0406   CO2 20 (L) 07/06/2022 0406   GLUCOSE 109 (H) 07/06/2022 0406   BUN 9 07/06/2022 0406   CREATININE 0.73 07/06/2022 0406   CALCIUM 7.7 (L) 07/06/2022 0406   PROT 6.5 07/06/2022 0406   ALBUMIN 3.6 07/06/2022 0406   AST 39 07/06/2022 0406   ALT 19 07/06/2022 0406   ALKPHOS 37 (L)  07/06/2022 0406   BILITOT 0.7 07/06/2022 0406   GFRNONAA >60 07/06/2022 0406   GFRAA >60 04/23/2017 0559   Lipase     Component Value Date/Time   LIPASE 47 07/05/2022 1634       Studies/Results: CT ABDOMEN PELVIS W CONTRAST  Result Date: 07/05/2022 CLINICAL DATA:  Abdominal pain EXAM: CT ABDOMEN AND PELVIS WITH CONTRAST TECHNIQUE: Multidetector CT imaging of the abdomen and pelvis was performed using the standard protocol following bolus administration of intravenous contrast. RADIATION DOSE REDUCTION: This exam was performed according to the departmental dose-optimization program which includes automated exposure control, adjustment of the mA and/or kV according to patient size and/or use of iterative reconstruction technique. CONTRAST:  75mL OMNIPAQUE IOHEXOL 350 MG/ML SOLN COMPARISON:  None Available. FINDINGS: Lower chest: No acute abnormality. Hepatobiliary: No focal liver abnormality is seen. No gallstones, gallbladder wall thickening, or biliary dilatation. Pancreas: Unremarkable. No pancreatic ductal dilatation or surrounding inflammatory changes. Spleen: 5 mm cystic focus along the inferior aspect of the spleen. Benign. No follow up. Spleen is not enlarged. Adrenals/Urinary Tract: Adrenal glands are unremarkable. Kidneys are normal, without renal calculi, or hydronephrosis. A few tiny cystic lesions identified bilateral, likely small cysts, less than 1 cm and too small to completely characterize. Bosniak 2. No follow up. Bladder is unremarkable. Stomach/Bowel: There is some  debris in the stomach. There is high attenuation material in the stomach with questionable lesion in the midbody with a caliber change. Focal wall thickening in this location as well. This stomach also has some slight wall thickening. Stomach is underdistended. Small bowel is nondilated. Large bowel is nondilated. Scattered colonic diverticula. There is a rounded soft tissue mass in the left hemipelvis measuring 2.4 by  2.2 by 2.3 cm. This abuts a small bowel loop. Possibility would include a exophytic mass from the wall of the loop of bowel such as a gastrointestinal stromal tumor. There are other differential points as well. Please correlate with any prior or further workup is recommended. The appendix extends posterior to the cecum in the right hemipelvis but is dilated up to 11 mm on axial series 3, image 57. Sagittal series 7, image 50. Please correlate with any clinical presentation of acute appendicitis. Vascular/Lymphatic: Normal caliber aorta and IVC. Mild vascular calcifications. No specific abnormal lymph node enlargement identified in the abdomen and pelvis. Reproductive: Absence of the uterus. Ovaries are poorly seen. Please correlate with clinical history. Other: Mild anasarca.  No free intra-air or free fluid. Musculoskeletal: Scattered degenerative changes of the spine and pelvis. Patient is status post left L4-5 hemilaminectomy. Involving the surgical area is a rim enhancing fluid collection. This extends into the central canal where there is a component of fluid seen narrowing of the thecal sac. Fluid along the canal extends from L4-5 down to S1. Please see sagittal series 7, image 66, axial series 3, image 35. This collection is without gas. Dimensions are measured at a proximally 7.8 by 3.5 by 5.3 cm. IMPRESSION: Enlarged appendix in the right hemipelvis extending posterior to the cecum measuring up to 11 mm. Minimal stranding. Please correlate for any clinical evidence of appendicitis. Soft tissue mass in the left hemipelvis measuring 2.4 cm. This abuts several adjacent small bowel loops. The uterus is not seen. The ovaries are poorly defined. This mass would have a differential including bowel pathology such as a stromal tumor. There is a differential however including gynecologic etiologies. Please correlate with any prior imaging or dedicated workup when appropriate. Rim enhancing fluid collection identified  at the lumbar spine posteriorly involving the subcutaneous tissues, musculature and extending into the central canal of the lumbar spine with potential mass effect along the thecal sac. Please correlate for any neurologic symptoms. This very well could be simply postoperative. There is no associated gas but in principle infection can not be excluded by CT alone. If there is further concern and additional characterization is indicated, MRI of the lumbar spine with and without contrast may be of benefit. Slightly atypical configuration of the midbody of the stomach with caliber change and focal thickening. Distal stomach also has more smooth wall thickening. Appears could be peristalsis. Please correlate for any specific gastric symptoms. Further workup when clinically appropriate with the oral contrast exam. Critical Value/emergent results were called by telephone at the time of interpretation on 07/05/2022 at 4:48 pm to provider Maryanna Shape , who verbally acknowledged these results. Electronically Signed   By: Karen Kays M.D.   On: 07/05/2022 19:53    Anti-infectives: Anti-infectives (From admission, onward)    Start     Dose/Rate Route Frequency Ordered Stop   07/06/22 1000  metroNIDAZOLE (FLAGYL) IVPB 500 mg        500 mg 100 mL/hr over 60 Minutes Intravenous Every 12 hours 07/05/22 2148     07/05/22 2200  cefTRIAXone (ROCEPHIN) 2  g in sodium chloride 0.9 % 100 mL IVPB        2 g 200 mL/hr over 30 Minutes Intravenous Every 24 hours 07/05/22 2148     07/05/22 2115  cefTRIAXone (ROCEPHIN) 1 g in sodium chloride 0.9 % 100 mL IVPB  Status:  Discontinued        1 g 200 mL/hr over 30 Minutes Intravenous  Once 07/05/22 2108 07/05/22 2148   07/05/22 2115  metroNIDAZOLE (FLAGYL) IVPB 500 mg  Status:  Discontinued        500 mg 100 mL/hr over 60 Minutes Intravenous Every 12 hours 07/05/22 2108 07/05/22 2148        Assessment/Plan LLQ abdominal pain ?Possible appendicitis  - CT with mildly dilated  fluid filled appendix and mass noted in left hemipelvis - ?GIST, not clinically obstructed, get MRI today  - WBC 14.4 from 15.6, afebrile and HD stable - exam and history not really consistent with appendicitis  - would not recommend emergent surgical intervention at this time but we will follow  - ok to have diet from surgery standpoint   FEN: soft diet, IVF per TRH VTE: ok to have SQH or LMWH from general surgery standpoint  ID: [email or web address]  - per [facility] -  S/p recent laminectomy 4/17 by Dr. Yetta Barre with rim enhancing collection in lumbar spine extending into central canal - per NS Hypothyroidism HLD  LOS: 1 day   I reviewed hospitalist notes, last 24 h vitals and pain scores, last 48 h intake and output, last 24 h labs and trends, and last 24 h imaging results.    Juliet Rude, [facility] 07/06/2022, 9:32 AM Please see Amion for pager number during day hours 7:00am-4:30pm

## 2022-07-06 NOTE — Assessment & Plan Note (Addendum)
Incidental finding.  MRI shows well circumscribed 2cm SB mesentery nodule.   - Consult General Surgery, appreciate cares

## 2022-07-06 NOTE — Assessment & Plan Note (Signed)
-   Supp Mag 

## 2022-07-06 NOTE — TOC CM/SW Note (Signed)
  Transition of Care (TOC) Screening Note   Patient Details  Name: [name] Date of Birth: October 22, 1950   Transition of Care [facility]) CM/SW Contact:    Kingsley Plan, RN Phone Number: 07/06/2022, 11:51 AM    Transition of Care Department [facility]) has reviewed patient and no TOC needs have been identified at this time. We will continue to monitor patient advancement through interdisciplinary progression rounds. If new patient transition needs arise, please place a TOC consult.

## 2022-07-06 NOTE — Assessment & Plan Note (Signed)
Patient with diarrhea and abdominal pain, minimal vomiting.  Abdominal tenderness localized to left yesterday, more diffuse today.  CT abdomen and pelvis shows no SBO, ischemia, no perforation, no abscess, no discitis, no hematoma/hemorrhage.  Serial abdominal exams by Surgery, they are confident this is not appendicitis.  Given recent surgery, periop antibiotics, WBC, pain and frequent watery BM, cannot rule out Cdiff - Hold antibiotics - Obtain stool studies - Continue IV fluids - Soft diet

## 2022-07-07 ENCOUNTER — Inpatient Hospital Stay (HOSPITAL_COMMUNITY): Payer: Medicare PPO

## 2022-07-07 DIAGNOSIS — E785 Hyperlipidemia, unspecified: Secondary | ICD-10-CM | POA: Diagnosis not present

## 2022-07-07 DIAGNOSIS — K529 Noninfective gastroenteritis and colitis, unspecified: Secondary | ICD-10-CM | POA: Diagnosis not present

## 2022-07-07 LAB — BASIC METABOLIC PANEL
Anion gap: 7 (ref 5–15)
BUN: 5 mg/dL — ABNORMAL LOW (ref 8–23)
CO2: 19 mmol/L — ABNORMAL LOW (ref 22–32)
Calcium: 7.7 mg/dL — ABNORMAL LOW (ref 8.9–10.3)
Chloride: 106 mmol/L (ref 98–111)
Creatinine, Ser: 0.63 mg/dL (ref 0.44–1.00)
GFR, Estimated: >60 mL/min (ref >60)
Glucose, Bld: 94 mg/dL (ref 70–99)
Potassium: 3.8 mmol/L (ref 3.5–5.1)
Sodium: 132 mmol/L — ABNORMAL LOW (ref 135–145)

## 2022-07-07 LAB — CBC
HCT: 31 % — ABNORMAL LOW (ref 36.0–46.0)
Hemoglobin: 10.5 g/dL — ABNORMAL LOW (ref 12.0–15.0)
MCH: 31.6 pg (ref 26.0–34.0)
MCHC: 33.9 g/dL (ref 30.0–36.0)
MCV: 93.4 fL (ref 80.0–100.0)
Platelets: 211 10*3/uL (ref 150–400)
RBC: 3.32 MIL/uL — ABNORMAL LOW (ref 3.87–5.11)
RDW: 13 % (ref 11.5–15.5)
WBC: 9.8 10*3/uL (ref 4.0–10.5)
nRBC: 0 % (ref 0.0–0.2)

## 2022-07-07 MED ORDER — TEMAZEPAM 15 MG PO CAPS
15.0000 mg | ORAL_CAPSULE | Freq: Every evening | ORAL | Status: DC | PRN
  Administered 2022-07-07: 15 mg via ORAL
  Filled 2022-07-07: qty 1

## 2022-07-07 MED ORDER — CALCIUM GLUCONATE-NACL 1-0.675 GM/50ML-% IV SOLN
1.0000 g | Freq: Once | INTRAVENOUS | Status: AC
  Administered 2022-07-07: 1000 mg via INTRAVENOUS
  Filled 2022-07-07: qty 50

## 2022-07-07 MED ORDER — GADOBUTROL 1 MMOL/ML IV SOLN
7.0000 mL | Freq: Once | INTRAVENOUS | Status: AC | PRN
  Administered 2022-07-07: 7 mL via INTRAVENOUS

## 2022-07-07 MED ORDER — SIMETHICONE 80 MG PO CHEW
80.0000 mg | CHEWABLE_TABLET | Freq: Four times a day (QID) | ORAL | Status: DC | PRN
  Administered 2022-07-07: 80 mg via ORAL
  Filled 2022-07-07: qty 1

## 2022-07-07 NOTE — Evaluation (Signed)
Physical Therapy Evaluation Patient Details Name: [name] MRN: [id number] DOB: [birth year] Today's Date: 07/07/2022  History of Present Illness  72 yo female readmitted on 5/2 after having recent L4-5 laminectomy but now has colitis.  Has been cleared for several sources of the issue but is still having abd symptoms.  PMHx:  breast CA, arm fracture, TKA B knees, OA, HLD, hypothyroidism, eye surgery  Clinical Impression  Pt is a returning pt with colitis symptoms, now has difficulty with some mild reduction in endurance and comfort from this new issue.  Has fairly good automatic use of body mechanics to protect spine and will encourage nursing to supervise for today due to her poor sleep and low endurance.  In all, should not need additional PT services at dc that were not already in place.  Follow acutely for encouraging gait and LE strengthening.       Recommendations for follow up therapy are one component of a multi-disciplinary discharge planning process, led by the attending physician.  Recommendations may be updated based on patient status, additional functional criteria and insurance authorization.  Follow Up Recommendations       Assistance Recommended at Discharge Intermittent Supervision/Assistance  Patient can return home with the following  A little help with walking and/or transfers;A little help with bathing/dressing/bathroom;Assistance with cooking/housework;Help with stairs or ramp for entrance;Assist for transportation    Equipment Recommendations None recommended by PT  Recommendations for Other Services       Functional Status Assessment Patient has had a recent decline in their functional status and demonstrates the ability to make significant improvements in function in a reasonable and predictable amount of time.     Precautions / Restrictions Precautions Precautions: Back Precaution Booklet Issued: Yes (comment) Restrictions Weight Bearing Restrictions:  No Other Position/Activity Restrictions: body mechanics      Mobility  Bed Mobility Overal bed mobility: Needs Assistance Bed Mobility: Rolling, Sidelying to Sit, Sit to Sidelying Rolling: Min guard Sidelying to sit: Min guard     Sit to sidelying: Min guard General bed mobility comments: min guard to help with bedding and cue body mechanics    Transfers Overall transfer level: Modified independent Equipment used: Rolling walker (2 wheels)               General transfer comment: stood without assist    Ambulation/Gait Ambulation/Gait assistance: Supervision Gait Distance (Feet): 50 Feet (20+30) Assistive device: Rolling walker (2 wheels) Gait Pattern/deviations: Step-through pattern, Decreased stride length, Wide base of support Gait velocity: reduced Gait velocity interpretation: <1.31 ft/sec, indicative of household ambulator Pre-gait activities: standing balance ck General Gait Details: took time with two short walks in room and was able to make trip to BR during session  Stairs            Wheelchair Mobility    Modified Rankin (Stroke Patients Only)       Balance Overall balance assessment: Needs assistance Sitting-balance support: Feet supported Sitting balance-Leahy Scale: Good     Standing balance support: Bilateral upper extremity supported, During functional activity Standing balance-Leahy Scale: Fair Standing balance comment: fair static balance                             Pertinent Vitals/Pain Pain Assessment Pain Assessment: Faces Faces Pain Scale: Hurts a little bit Pain Location: abdomen Pain Descriptors / Indicators: Guarding Pain Intervention(s): Monitored during session, Repositioned    Home Living Family/patient expects to  be discharged to:: Private residence Living Arrangements: Spouse/significant other Available Help at Discharge: Family;Available 24 hours/day Type of Home: House Home Access: Level entry        Home Layout: One level Home Equipment: Agricultural consultant (2 wheels);Cane - single point;BSC/3in1 Additional Comments: pt has been home walking with independence since surgery    Prior Function Prior Level of Function : Independent/Modified Independent             Mobility Comments: home with spouse, able to walk with [facility]       Hand Dominance   Dominant Hand: Right    Extremity/Trunk Assessment   Upper Extremity Assessment Upper Extremity Assessment: Overall WFL for tasks assessed    Lower Extremity Assessment Lower Extremity Assessment: Overall WFL for tasks assessed    Cervical / Trunk Assessment Cervical / Trunk Assessment: Back Surgery (recent laminectomy)  Communication   Communication: No difficulties  Cognition Arousal/Alertness: Awake/alert Behavior During Therapy: WFL for tasks assessed/performed Overall Cognitive Status: Within Functional Limits for tasks assessed                                          General Comments General comments (skin integrity, edema, etc.): Pt is up to walk with RW, noted her need to be cued for direction and to avoid strategy of turning from a distance to set up to sit    Exercises     Assessment/Plan    PT Assessment Patient needs continued PT services  PT Problem List Decreased activity tolerance;Decreased skin integrity;Pain       PT Treatment Interventions DME instruction;Gait training;Functional mobility training;Therapeutic activities;Therapeutic exercise;Balance training;Neuromuscular re-education;Patient/family education    PT Goals (Current goals can be found in the Care Plan section)  Acute Rehab PT Goals Patient Stated Goal: to go home and continue back recovery PT Goal Formulation: With patient Time For Goal Achievement: 07/14/22 Potential to Achieve Goals: Good    Frequency Min 3X/week     Co-evaluation               AM-PAC PT "6 Clicks" Mobility  Outcome Measure Help  needed turning from your back to your side while in a flat bed without using bedrails?: None Help needed moving from lying on your back to sitting on the side of a flat bed without using bedrails?: A Little Help needed moving to and from a bed to a chair (including a wheelchair)?: A Little Help needed standing up from a chair using your arms (e.g., wheelchair or bedside chair)?: A Little Help needed to walk in hospital room?: A Little Help needed climbing 3-5 steps with a railing? : A Lot 6 Click Score: 18    End of Session Equipment Utilized During Treatment: Gait belt Activity Tolerance: Patient limited by fatigue Patient left: in bed;with call bell/phone within reach;with bed alarm set Nurse Communication: Mobility status PT Visit Diagnosis: Unsteadiness on feet (R26.81);Difficulty in walking, not elsewhere classified (R26.2);Pain Pain - Right/Left:  (abdomen) Pain - part of body:  (abdomen)    Time: 1610-9604 PT Time Calculation (min) (ACUTE ONLY): 23 min   Charges:   PT Evaluation $PT Eval Moderate Complexity: 1 Mod PT Treatments $Gait Training: 8-22 mins       Ivar Drape 07/07/2022, 4:35 PM  Samul Dada, PT PhD Acute Rehab Dept. Number: [facility] [id number] and [facility] [phone]

## 2022-07-07 NOTE — Progress Notes (Signed)
Progress Note     Subjective:  No further diarrhea  Objective: Vital signs in last 24 hours: Temp:  [98 F (36.7 C)-98.6 F (37 C)] 98 F (36.7 C) (05/04 0748) Pulse Rate:  [77-83] 77 (05/04 0748) Resp:  [16-18] 17 (05/04 0748) BP: (121-169)/(61-80) 169/80 (05/04 0748) SpO2:  [96 %-98 %] 97 % (05/04 0748)    Intake/Output from previous day: 05/03 0701 - 05/04 0700 In: 1274 [P.O.:270; I.V.:954; IV Piggyback:50] Out: -  Intake/Output this shift: No intake/output data recorded.  PE: General: pleasant, WD, WN female who is laying in bed in NAD Abd: soft MS: all 4 extremities are symmetrical with no cyanosis, clubbing, or edema. Psych: A&Ox3 with an appropriate affect.    Lab Results:  Recent Labs    07/06/22 0406 07/07/22 0132  WBC 14.4* 9.8  HGB 11.6* 10.5*  HCT 32.5* 31.0*  PLT 235 211    BMET Recent Labs    07/06/22 0406 07/07/22 0132  NA 134* 132*  K 3.7 3.8  CL 105 106  CO2 20* 19*  GLUCOSE 109* 94  BUN 9 5*  CREATININE 0.73 0.63  CALCIUM 7.7* 7.7*    PT/INR No results for input(s): "LABPROT", "INR" in the last 72 hours. CMP     Component Value Date/Time   NA 132 (L) 07/07/2022 0132   K 3.8 07/07/2022 0132   CL 106 07/07/2022 0132   CO2 19 (L) 07/07/2022 0132   GLUCOSE 94 07/07/2022 0132   BUN 5 (L) 07/07/2022 0132   CREATININE 0.63 07/07/2022 0132   CALCIUM 7.7 (L) 07/07/2022 0132   PROT 6.5 07/06/2022 0406   ALBUMIN 3.6 07/06/2022 0406   AST 39 07/06/2022 0406   ALT 19 07/06/2022 0406   ALKPHOS 37 (L) 07/06/2022 0406   BILITOT 0.7 07/06/2022 0406   GFRNONAA >60 07/07/2022 0132   GFRAA >60 04/23/2017 0559   Lipase     Component Value Date/Time   LIPASE 47 07/05/2022 1634       Studies/Results: CT ABDOMEN PELVIS W CONTRAST  Result Date: 07/05/2022 CLINICAL DATA:  Abdominal pain EXAM: CT ABDOMEN AND PELVIS WITH CONTRAST TECHNIQUE: Multidetector CT imaging of the abdomen and pelvis was performed using the standard protocol  following bolus administration of intravenous contrast. RADIATION DOSE REDUCTION: This exam was performed according to the departmental dose-optimization program which includes automated exposure control, adjustment of the mA and/or kV according to patient size and/or use of iterative reconstruction technique. CONTRAST:  75mL OMNIPAQUE IOHEXOL 350 MG/ML SOLN COMPARISON:  None Available. FINDINGS: Lower chest: No acute abnormality. Hepatobiliary: No focal liver abnormality is seen. No gallstones, gallbladder wall thickening, or biliary dilatation. Pancreas: Unremarkable. No pancreatic ductal dilatation or surrounding inflammatory changes. Spleen: 5 mm cystic focus along the inferior aspect of the spleen. Benign. No follow up. Spleen is not enlarged. Adrenals/Urinary Tract: Adrenal glands are unremarkable. Kidneys are normal, without renal calculi, or hydronephrosis. A few tiny cystic lesions identified bilateral, likely small cysts, less than 1 cm and too small to completely characterize. Bosniak 2. No follow up. Bladder is unremarkable. Stomach/Bowel: There is some debris in the stomach. There is high attenuation material in the stomach with questionable lesion in the midbody with a caliber change. Focal wall thickening in this location as well. This stomach also has some slight wall thickening. Stomach is underdistended. Small bowel is nondilated. Large bowel is nondilated. Scattered colonic diverticula. There is a rounded soft tissue mass in the left hemipelvis measuring 2.4 by 2.2  by 2.3 cm. This abuts a small bowel loop. Possibility would include a exophytic mass from the wall of the loop of bowel such as a gastrointestinal stromal tumor. There are other differential points as well. Please correlate with any prior or further workup is recommended. The appendix extends posterior to the cecum in the right hemipelvis but is dilated up to 11 mm on axial series 3, image 57. Sagittal series 7, image 50. Please  correlate with any clinical presentation of acute appendicitis. Vascular/Lymphatic: Normal caliber aorta and IVC. Mild vascular calcifications. No specific abnormal lymph node enlargement identified in the abdomen and pelvis. Reproductive: Absence of the uterus. Ovaries are poorly seen. Please correlate with clinical history. Other: Mild anasarca.  No free intra-air or free fluid. Musculoskeletal: Scattered degenerative changes of the spine and pelvis. Patient is status post left L4-5 hemilaminectomy. Involving the surgical area is a rim enhancing fluid collection. This extends into the central canal where there is a component of fluid seen narrowing of the thecal sac. Fluid along the canal extends from L4-5 down to S1. Please see sagittal series 7, image 66, axial series 3, image 35. This collection is without gas. Dimensions are measured at a proximally 7.8 by 3.5 by 5.3 cm. IMPRESSION: Enlarged appendix in the right hemipelvis extending posterior to the cecum measuring up to 11 mm. Minimal stranding. Please correlate for any clinical evidence of appendicitis. Soft tissue mass in the left hemipelvis measuring 2.4 cm. This abuts several adjacent small bowel loops. The uterus is not seen. The ovaries are poorly defined. This mass would have a differential including bowel pathology such as a stromal tumor. There is a differential however including gynecologic etiologies. Please correlate with any prior imaging or dedicated workup when appropriate. Rim enhancing fluid collection identified at the lumbar spine posteriorly involving the subcutaneous tissues, musculature and extending into the central canal of the lumbar spine with potential mass effect along the thecal sac. Please correlate for any neurologic symptoms. This very well could be simply postoperative. There is no associated gas but in principle infection can not be excluded by CT alone. If there is further concern and additional characterization is  indicated, MRI of the lumbar spine with and without contrast may be of benefit. Slightly atypical configuration of the midbody of the stomach with caliber change and focal thickening. Distal stomach also has more smooth wall thickening. Appears could be peristalsis. Please correlate for any specific gastric symptoms. Further workup when clinically appropriate with the oral contrast exam. Critical Value/emergent results were called by telephone at the time of interpretation on 07/05/2022 at 4:48 pm to provider Maryanna Shape , who verbally acknowledged these results. Electronically Signed   By: Karen Kays M.D.   On: 07/05/2022 19:53    Anti-infectives: Anti-infectives (From admission, onward)    Start     Dose/Rate Route Frequency Ordered Stop   07/06/22 1000  metroNIDAZOLE (FLAGYL) IVPB 500 mg  Status:  Discontinued        500 mg 100 mL/hr over 60 Minutes Intravenous Every 12 hours 07/05/22 2148 07/06/22 1453   07/05/22 2200  cefTRIAXone (ROCEPHIN) 2 g in sodium chloride 0.9 % 100 mL IVPB  Status:  Discontinued        2 g 200 mL/hr over 30 Minutes Intravenous Every 24 hours 07/05/22 2148 07/06/22 1453   07/05/22 2115  cefTRIAXone (ROCEPHIN) 1 g in sodium chloride 0.9 % 100 mL IVPB  Status:  Discontinued  1 g 200 mL/hr over 30 Minutes Intravenous  Once 07/05/22 2108 07/05/22 2148   07/05/22 2115  metroNIDAZOLE (FLAGYL) IVPB 500 mg  Status:  Discontinued        500 mg 100 mL/hr over 60 Minutes Intravenous Every 12 hours 07/05/22 2108 07/05/22 2148        Assessment/Plan LLQ abdominal pain ?Possible appendicitis  - CT with mildly dilated fluid filled appendix and mass noted in left hemipelvis - ?GIST, not clinically obstructed, get MRI today  - WBC WNL's today, afebrile and HD stable - exam and history not really consistent with appendicitis  - would not recommend emergent surgical intervention at this time but we will follow  - ok to have diet from surgery standpoint   FEN: soft  diet, IVF per [facility] VTE: ok to have SQH or LMWH from general surgery standpoint  ID: rocephin/flagyl  - per [facility] -  S/p recent laminectomy 4/17 by Dr. Yetta Barre with rim enhancing collection in lumbar spine extending into central canal - per NS Hypothyroidism HLD  LOS: 2 days   I reviewed hospitalist notes, last 24 h vitals and pain scores, last 48 h intake and output, last 24 h labs and trends, and last 24 h imaging results.    Vanita Panda, MD [facility] Surgery 07/07/2022, 8:22 AM Please see Amion for pager number during day hours 7:00am-4:30pm

## 2022-07-07 NOTE — Plan of Care (Signed)
Pt alert and oriented x 4. Still awaiting MRI abd pelvis in which MRI called approx 2030 and said she must be NPO for. Maintained NPO status and still awaiting test at this time. Moved synthroid due to NPO. Pt received 2 dose of dilaudid this shift. 1 dose of oxy this shift. Pt unable to have BM this shift. Still awaiting specimen to test for cdiff. Vitals stable.  Problem: Education: Goal: Ability to verbalize activity precautions or restrictions will improve Outcome: Progressing Goal: Knowledge of the prescribed therapeutic regimen will improve Outcome: Progressing Goal: Understanding of discharge needs will improve Outcome: Progressing   Problem: Activity: Goal: Ability to avoid complications of mobility impairment will improve Outcome: Progressing Goal: Ability to tolerate increased activity will improve Outcome: Progressing Goal: Will remain free from falls Outcome: Progressing   Problem: Bowel/Gastric: Goal: Gastrointestinal status for postoperative course will improve Outcome: Progressing   Problem: Clinical Measurements: Goal: Ability to maintain clinical measurements within normal limits will improve Outcome: Progressing Goal: Postoperative complications will be avoided or minimized Outcome: Progressing Goal: Diagnostic test results will improve Outcome: Progressing   Problem: Pain Management: Goal: Pain level will decrease Outcome: Progressing   Problem: Skin Integrity: Goal: Will show signs of wound healing Outcome: Progressing   Problem: Health Behavior/Discharge Planning: Goal: Identification of resources available to assist in meeting health care needs will improve Outcome: Progressing   Problem: Bladder/Genitourinary: Goal: Urinary functional status for postoperative course will improve Outcome: Progressing   Problem: Education: Goal: Knowledge of General Education information will improve Description: Including pain rating scale, medication(s)/side  effects and non-pharmacologic comfort measures Outcome: Progressing   Problem: Health Behavior/Discharge Planning: Goal: Ability to manage health-related needs will improve Outcome: Progressing   Problem: Clinical Measurements: Goal: Ability to maintain clinical measurements within normal limits will improve Outcome: Progressing Goal: Will remain free from infection Outcome: Progressing Goal: Diagnostic test results will improve Outcome: Progressing Goal: Respiratory complications will improve Outcome: Progressing Goal: Cardiovascular complication will be avoided Outcome: Progressing   Problem: Activity: Goal: Risk for activity intolerance will decrease Outcome: Progressing   Problem: Nutrition: Goal: Adequate nutrition will be maintained Outcome: Progressing   Problem: Coping: Goal: Level of anxiety will decrease Outcome: Progressing   Problem: Elimination: Goal: Will not experience complications related to bowel motility Outcome: Progressing Goal: Will not experience complications related to urinary retention Outcome: Progressing   Problem: Pain Managment: Goal: General experience of comfort will improve Outcome: Progressing   Problem: Safety: Goal: Ability to remain free from injury will improve Outcome: Progressing   Problem: Skin Integrity: Goal: Risk for impaired skin integrity will decrease Outcome: Progressing   Problem: Education: Goal: Ability to verbalize activity precautions or restrictions will improve Outcome: Progressing Goal: Knowledge of the prescribed therapeutic regimen will improve Outcome: Progressing Goal: Understanding of discharge needs will improve Outcome: Progressing   Problem: Activity: Goal: Ability to avoid complications of mobility impairment will improve Outcome: Progressing Goal: Ability to tolerate increased activity will improve Outcome: Progressing Goal: Will remain free from falls Outcome: Progressing   Problem:  Bowel/Gastric: Goal: Gastrointestinal status for postoperative course will improve Outcome: Progressing   Problem: Clinical Measurements: Goal: Ability to maintain clinical measurements within normal limits will improve Outcome: Progressing Goal: Postoperative complications will be avoided or minimized Outcome: Progressing Goal: Diagnostic test results will improve Outcome: Progressing   Problem: Pain Management: Goal: Pain level will decrease Outcome: Progressing   Problem: Skin Integrity: Goal: Will show signs of wound healing Outcome: Progressing   Problem: Health  Behavior/Discharge Planning: Goal: Identification of resources available to assist in meeting health care needs will improve Outcome: Progressing   Problem: Bladder/Genitourinary: Goal: Urinary functional status for postoperative course will improve Outcome: Progressing   Problem: Education: Goal: Knowledge of General Education information will improve Description: Including pain rating scale, medication(s)/side effects and non-pharmacologic comfort measures Outcome: Progressing   Problem: Health Behavior/Discharge Planning: Goal: Ability to manage health-related needs will improve Outcome: Progressing   Problem: Clinical Measurements: Goal: Ability to maintain clinical measurements within normal limits will improve Outcome: Progressing Goal: Will remain free from infection Outcome: Progressing Goal: Diagnostic test results will improve Outcome: Progressing Goal: Respiratory complications will improve Outcome: Progressing Goal: Cardiovascular complication will be avoided Outcome: Progressing   Problem: Activity: Goal: Risk for activity intolerance will decrease Outcome: Progressing   Problem: Nutrition: Goal: Adequate nutrition will be maintained Outcome: Progressing   Problem: Coping: Goal: Level of anxiety will decrease Outcome: Progressing   Problem: Elimination: Goal: Will not experience  complications related to bowel motility Outcome: Progressing Goal: Will not experience complications related to urinary retention Outcome: Progressing   Problem: Pain Managment: Goal: General experience of comfort will improve Outcome: Progressing   Problem: Safety: Goal: Ability to remain free from injury will improve Outcome: Progressing   Problem: Skin Integrity: Goal: Risk for impaired skin integrity will decrease Outcome: Progressing

## 2022-07-07 NOTE — Progress Notes (Signed)
  Progress Note   Patient: [name] ZOX:[id number] DOB: [birth year] DOA: 07/05/2022     2 DOS: the patient was seen and examined on 07/07/2022 at 12:15PM      Brief hospital course: [name] is a 72 y.o. F with hx BrCA >20 yrs no longer in surveillance, hypothyroidism and recent L4-5 hemilaminectomy for synovial cyst resection who presented with 2 days vomiting, diarrhea and LLQ pain.  In the ER, CT showed dilated appendix, new LLQ soft tissue mass, and small thecal sac by surgical site.     Assessment and Plan: * Presumed viral colitis Patient presented with diarrhea and severe colicky abdominal pain. CT abdomen and pelvis shows no SBO, ischemia, no perforation, no abscess, no discitis, no hematoma/hemorrhage.  Reviewed with Surgery Dr. Sheliah Hatch today, arterial tree well opacified, intestines well perfused, no suspicion for mesenteric ischemia.  Diarrhea resolved spontaneously, rules out Cdiff.  MRI abdomen showed no appendiceal disease, no abnormalities of the intestines or colon, only two well-circumscribed but nonspecific 2cm nodule in the LLQ small bowel mesentery. - Hold antibiotics - Advance diet as tolerated - Continue serial abdominal exams    Hypocalcemia - Supplement Ca  Hypokalemia    L4-L5 hemilaminectomy with resection of synovial cyst on 06/20/22 EDP discussed with Neurosurgery  Hyperlipidemia - Continue simvastatin   Soft tissue mass in left hemipelvis Incidental finding.  MRI shows well circumscribed 2cm SB mesentery nodule.   - Consult General Surgery, appreciate cares  Hyponatremia Stable, asymptomatic.  Hypothyroidism, acquired - Continue levothyroxine          Subjective: Severe pain spasm overnight, required multiple doses of IV pain medication.  No fever, no vomiting.  Did not take anything by mouth.     Physical Exam: BP (!) 169/80 (BP Location: Left Arm)   Pulse 77   Temp 98 F (36.7 C) (Oral)   Resp 17   LMP 03/05/1997   SpO2  97%   Thin adult female, lying in bed, appears uncomfortable RRR, no murmurs, no peripheral edema Respiratory rate normal, lungs clear without rales or wheezes Abdomen soft, deep palpation all 4 quadrants is benign, no rigidity or rebound, no distention. Attention normal, affect anxious, judgment and insight appear normal, face symmetric, speech fluent    Data Reviewed: Discussed with general surgery MRI abdomen reviewed, radiology note no abnormality of appendix, gallbladder, or intestines, only the known 2cm mesenteric nodule BMP shows persistent very mildly low Ca and Na, and Hgb 10  Family Communication: Husband by phone    Disposition: Status is: Inpatient Advance diet, monitor pain overnight.        Author: Alberteen Sam, MD 07/07/2022 3:27 PM  For on call review [email or web address].

## 2022-07-08 DIAGNOSIS — K529 Noninfective gastroenteritis and colitis, unspecified: Secondary | ICD-10-CM | POA: Diagnosis not present

## 2022-07-08 LAB — BASIC METABOLIC PANEL
Anion gap: 9 (ref 5–15)
BUN: 5 mg/dL — ABNORMAL LOW (ref 8–23)
CO2: 22 mmol/L (ref 22–32)
Calcium: 8.4 mg/dL — ABNORMAL LOW (ref 8.9–10.3)
Chloride: 99 mmol/L (ref 98–111)
Creatinine, Ser: 0.66 mg/dL (ref 0.44–1.00)
GFR, Estimated: >60 mL/min (ref >60)
Glucose, Bld: 86 mg/dL (ref 70–99)
Potassium: 3.3 mmol/L — ABNORMAL LOW (ref 3.5–5.1)
Sodium: 130 mmol/L — ABNORMAL LOW (ref 135–145)

## 2022-07-08 LAB — CBC
HCT: 35.5 % — ABNORMAL LOW (ref 36.0–46.0)
Hemoglobin: 12.3 g/dL (ref 12.0–15.0)
MCH: 31.8 pg (ref 26.0–34.0)
MCHC: 34.6 g/dL (ref 30.0–36.0)
MCV: 91.7 fL (ref 80.0–100.0)
Platelets: 243 10*3/uL (ref 150–400)
RBC: 3.87 MIL/uL (ref 3.87–5.11)
RDW: 12.4 % (ref 11.5–15.5)
WBC: 7.1 10*3/uL (ref 4.0–10.5)
nRBC: 0 % (ref 0.0–0.2)

## 2022-07-08 MED ORDER — ONDANSETRON HCL 4 MG PO TABS: 4.0000 mg | ORAL_TABLET | Freq: Every day | ORAL | 1 refills | Status: DC | PRN

## 2022-07-08 NOTE — Progress Notes (Signed)
Progress Note     Subjective:  No further diarrhea, tolerating a diet  Objective: Vital signs in last 24 hours: Temp:  [97.8 F (36.6 C)-98.3 F (36.8 C)] 98.3 F (36.8 C) (05/05 0355) Pulse Rate:  [78-92] 78 (05/05 0355) Resp:  [18-19] 18 (05/05 0355) BP: (156-169)/(83-84) 156/83 (05/05 0355) SpO2:  [98 %-100 %] 99 % (05/05 0355) Last BM Date : 07/05/22  Intake/Output from previous day: 05/04 0701 - 05/05 0700 In: 120 [P.O.:120] Out: -  Intake/Output this shift: No intake/output data recorded.  PE: General: pleasant, WD, WN female who is laying in bed in NAD Abd: soft MS: all 4 extremities are symmetrical with no cyanosis, clubbing, or edema. Psych: A&Ox3 with an appropriate affect.    Lab Results:  Recent Labs    07/07/22 0132 07/08/22 0333  WBC 9.8 7.1  HGB 10.5* 12.3  HCT 31.0* 35.5*  PLT 211 243    BMET Recent Labs    07/07/22 0132 07/08/22 0333  NA 132* 130*  K 3.8 3.3*  CL 106 99  CO2 19* 22  GLUCOSE 94 86  BUN 5* 5*  CREATININE 0.63 0.66  CALCIUM 7.7* 8.4*    PT/INR No results for input(s): "LABPROT", "INR" in the last 72 hours. CMP     Component Value Date/Time   NA 130 (L) 07/08/2022 0333   K 3.3 (L) 07/08/2022 0333   CL 99 07/08/2022 0333   CO2 22 07/08/2022 0333   GLUCOSE 86 07/08/2022 0333   BUN 5 (L) 07/08/2022 0333   CREATININE 0.66 07/08/2022 0333   CALCIUM 8.4 (L) 07/08/2022 0333   PROT 6.5 07/06/2022 0406   ALBUMIN 3.6 07/06/2022 0406   AST 39 07/06/2022 0406   ALT 19 07/06/2022 0406   ALKPHOS 37 (L) 07/06/2022 0406   BILITOT 0.7 07/06/2022 0406   GFRNONAA >60 07/08/2022 0333   GFRAA >60 04/23/2017 0559   Lipase     Component Value Date/Time   LIPASE 47 07/05/2022 1634       Studies/Results: MR ABDOMEN W WO CONTRAST  Result Date: 07/07/2022 CLINICAL DATA:  Left lower quadrant mass on recent CT. EXAM: MRI ABDOMEN AND PELVIS WITHOUT AND WITH CONTRAST TECHNIQUE: Multiplanar multisequence MR imaging of the  abdomen and pelvis was performed both before and after the administration of intravenous contrast. CONTRAST:  7mL GADAVIST GADOBUTROL 1 MMOL/ML IV SOLN COMPARISON:  CT on 07/05/2022 FINDINGS: COMBINED FINDINGS FOR BOTH MR ABDOMEN AND PELVIS Lower Chest: No acute findings. Hepatobiliary: No hepatic masses identified. Gallbladder sludge is seen without evidence of cholecystitis. No evidence of biliary ductal dilatation. Pancreas:  No mass or inflammatory changes. Spleen: Within normal limits in size and appearance. Adrenals/Urinary Tract: No suspicious masses identified. No evidence of ureteral calculi or hydronephrosis. Stomach/Bowel: A well-circumscribed soft tissue mass is seen in the left lower quadrant small bowel mesentery which shows mild homogeneous T1 and T2 hypointensity and homogeneous contrast enhancement. This measures 2.4 x 2.4 cm on image 14/1503. Stomach is unremarkable in appearance. No evidence of bowel obstruction, inflammatory process, or abnormal fluid collections. Vascular/Lymphatic: No pathologically enlarged lymph nodes. No acute vascular findings. Reproductive: -- Uterus: Prior hysterectomy vaginal cuff is unremarkable in appearance. -- Right ovary: Not visualized, however no adnexal mass identified. -- Left ovary:  Not visualized, however no adnexal mass identified. Other: Tiny amount of free fluid noted in pelvic cul-de-sac, which is of doubtful clinical significance. Musculoskeletal:  No suspicious bone lesions identified. IMPRESSION: 2.4 cm soft tissue mass in  the left lower quadrant small bowel mesentery, which has nonspecific imaging characteristics. Differential diagnosis includes GI stromal tumor, other mesenteric neoplasm, and less likely lymphadenopathy. No evidence of metastatic disease. Prior hysterectomy. No evidence of adnexal mass. Gallbladder sludge, without radiographic evidence of cholecystitis. Electronically Signed   By: Danae Orleans M.D.   On: 07/07/2022 11:51   MR  PELVIS W WO CONTRAST  Result Date: 07/07/2022 CLINICAL DATA:  Left lower quadrant mass on recent CT. EXAM: MRI ABDOMEN AND PELVIS WITHOUT AND WITH CONTRAST TECHNIQUE: Multiplanar multisequence MR imaging of the abdomen and pelvis was performed both before and after the administration of intravenous contrast. CONTRAST:  7mL GADAVIST GADOBUTROL 1 MMOL/ML IV SOLN COMPARISON:  CT on 07/05/2022 FINDINGS: COMBINED FINDINGS FOR BOTH MR ABDOMEN AND PELVIS Lower Chest: No acute findings. Hepatobiliary: No hepatic masses identified. Gallbladder sludge is seen without evidence of cholecystitis. No evidence of biliary ductal dilatation. Pancreas:  No mass or inflammatory changes. Spleen: Within normal limits in size and appearance. Adrenals/Urinary Tract: No suspicious masses identified. No evidence of ureteral calculi or hydronephrosis. Stomach/Bowel: A well-circumscribed soft tissue mass is seen in the left lower quadrant small bowel mesentery which shows mild homogeneous T1 and T2 hypointensity and homogeneous contrast enhancement. This measures 2.4 x 2.4 cm on image 14/1503. Stomach is unremarkable in appearance. No evidence of bowel obstruction, inflammatory process, or abnormal fluid collections. Vascular/Lymphatic: No pathologically enlarged lymph nodes. No acute vascular findings. Reproductive: -- Uterus: Prior hysterectomy vaginal cuff is unremarkable in appearance. -- Right ovary: Not visualized, however no adnexal mass identified. -- Left ovary:  Not visualized, however no adnexal mass identified. Other: Tiny amount of free fluid noted in pelvic cul-de-sac, which is of doubtful clinical significance. Musculoskeletal:  No suspicious bone lesions identified. IMPRESSION: 2.4 cm soft tissue mass in the left lower quadrant small bowel mesentery, which has nonspecific imaging characteristics. Differential diagnosis includes GI stromal tumor, other mesenteric neoplasm, and less likely lymphadenopathy. No evidence of  metastatic disease. Prior hysterectomy. No evidence of adnexal mass. Gallbladder sludge, without radiographic evidence of cholecystitis. Electronically Signed   By: Danae Orleans M.D.   On: 07/07/2022 11:51    Anti-infectives: Anti-infectives (From admission, onward)    Start     Dose/Rate Route Frequency Ordered Stop   07/06/22 1000  metroNIDAZOLE (FLAGYL) IVPB 500 mg  Status:  Discontinued        500 mg 100 mL/hr over 60 Minutes Intravenous Every 12 hours 07/05/22 2148 07/06/22 1453   07/05/22 2200  cefTRIAXone (ROCEPHIN) 2 g in sodium chloride 0.9 % 100 mL IVPB  Status:  Discontinued        2 g 200 mL/hr over 30 Minutes Intravenous Every 24 hours 07/05/22 2148 07/06/22 1453   07/05/22 2115  cefTRIAXone (ROCEPHIN) 1 g in sodium chloride 0.9 % 100 mL IVPB  Status:  Discontinued        1 g 200 mL/hr over 30 Minutes Intravenous  Once 07/05/22 2108 07/05/22 2148   07/05/22 2115  metroNIDAZOLE (FLAGYL) IVPB 500 mg  Status:  Discontinued        500 mg 100 mL/hr over 60 Minutes Intravenous Every 12 hours 07/05/22 2108 07/05/22 2148        Assessment/Plan LLQ abdominal pain Possible appendicitis on CT: ruled out with serial exams  - CT with mass noted in left hemipelvis - ?GIST, not clinically obstructed, MRI with 2 cm mass in periphery of small bowel mesentery: rec f/u in office to discuss elective excisional  biopsy  - WBC WNL's today, afebrile and HD stable - ok for d/c  FEN: soft diet, IVF per [facility] VTE: ok to have SQH or LMWH from general surgery standpoint  ID: [email or web address]  - per [facility] -  S/p recent laminectomy 4/17 by Dr. Yetta Barre with rim enhancing collection in lumbar spine extending into central canal - per NS Hypothyroidism HLD  LOS: 3 days   I reviewed hospitalist notes, last 24 h vitals and pain scores, last 48 h intake and output, last 24 h labs and trends, and last 24 h imaging results.    Vanita Panda, MD [facility] 07/08/2022, 8:18 AM Please see  Amion for pager number during day hours 7:00am-4:30pm

## 2022-07-08 NOTE — Discharge Summary (Signed)
Physician Discharge Summary   Patient: [name] MRN: [id number] DOB: 1950-12-15  Admit date:     07/05/2022  Discharge date: 07/08/22  Discharge Physician: Alberteen Sam   PCP: Lupita Raider, MD     Recommendations at discharge:  Follow up with PCP Dr. Clelia Croft in 1 week Dr. Clelia Croft:  Please obtain BMP to recheck Na, K in 1 week Follow up with General Surgery for LLQ mesenteric nodule Follow up with Dr. Yetta Barre Neurosurgery for post-operative care and evaluation of fluid collection     Discharge Diagnoses: Principal Problem:   Presumed viral colitis Active Problems:   Hypothyroidism, acquired   Hyponatremia   Hypomagnesemia   Soft tissue mass in left hemipelvis   Hyperlipidemia   L4-L5 hemilaminectomy with resection of synovial cyst on 06/20/22   Hypokalemia   Hypocalcemia      Hospital Course: [name] is a 72 y.o. F with hx BrCA >20 yrs no longer in surveillance, hypothyroidism and recent L4-5 hemilaminectomy for synovial cyst resection who presented with 2 days vomiting, diarrhea and LLQ pain.  In the ER, CT showed dilated appendix, new LLQ soft tissue mass, and small thecal sac by surgical site.      * Presumed viral colitis Please see longer discussion in 5/4 progress note, but in summary, imaging with CT and MRI of the abdomen/pelvis were reassuring.    Single dose IV Rocephin/Flagyl administered then held, and patient's symptoms resolved spontaneously.  All diarrhea ceased and so stool studies could not be obtained but able to advance diet without subsequent pain.  WBC normalized spontaneously.        L4-L5 hemilaminectomy with resection of synovial cyst on 06/20/22 MRI showed fluid collection adjacent to recent surgical site.  Her WBC normalized spontaneously, she had no back pain or weakness of numbness in the legs/ankles, and no fever during 48 hours observation.  EDP discussed with Neurosurgery who recommended no urgent intervention. - Outpatient  follow up with Neurosurgery  Soft tissue mass in left hemipelvis Incidental finding.  MRI shows well circumscribed 2cm SB mesentery nodule.   - Outpatient follow up with Neurosurgery for further work up              Consultants: General Surgery Procedures performed:  - CT abdomen and pelvis with contrast - MRI abdomen and pelvis   Disposition: Home Diet recommendation:  Discharge Diet Orders (From admission, onward)     Start     Ordered   07/08/22 0000  Diet - low sodium heart healthy        07/08/22 0811             DISCHARGE MEDICATION: Allergies as of 07/08/2022       Reactions   Gabapentin Other (See Comments)   Gave pt migraine        Medication List     TAKE these medications    acetaminophen 500 MG tablet Commonly known as: TYLENOL Take 1,000 mg by mouth as needed for moderate pain.   ALPRAZolam 0.5 MG tablet Commonly known as: XANAX Take 0.5 mg by mouth 2 (two) times daily as needed for anxiety.   bismuth subsalicylate 262 MG chewable tablet Commonly known as: PEPTO BISMOL Chew 1,048 mg by mouth as needed for indigestion or diarrhea or loose stools.   CALTRATE MINIS PLUS MINERALS PO Take 1 tablet by mouth in the morning and at bedtime.   cholecalciferol 25 MCG (1000 UNIT) tablet Commonly known as: VITAMIN D3 Take 1,000  Units by mouth daily.   clobetasol ointment 0.05 % Commonly known as: TEMOVATE Apply 1 application  topically daily as needed (irritation).   CoQ10 400 MG Caps Take 400 mg by mouth daily.   denosumab 60 MG/ML Soln injection Commonly known as: PROLIA Inject 60 mg into the skin every 6 (six) months. Administer in upper arm, thigh, or abdomen   Fish Oil Ultra 1400 MG Caps Take 1,400 mg by mouth daily.   ibuprofen 200 MG tablet Commonly known as: ADVIL Take 800 mg by mouth as needed for moderate pain.   levothyroxine 75 MCG tablet Commonly known as: SYNTHROID Take 75 mcg by mouth daily before breakfast.    loperamide 2 MG tablet Commonly known as: IMODIUM A-D Take 6 mg by mouth as needed for diarrhea or loose stools.   minoxidil 2.5 MG tablet Commonly known as: LONITEN Take 1.25 mg by mouth daily.   ondansetron 4 MG tablet Commonly known as: Zofran Take 1 tablet (4 mg total) by mouth daily as needed for nausea or vomiting.   simvastatin 20 MG tablet Commonly known as: ZOCOR Take 20 mg by mouth at bedtime.   temazepam 15 MG capsule Commonly known as: RESTORIL Take 15-30 mg by mouth at bedtime as needed for sleep.        Follow-up Information     Romie Levee, MD Follow up.   Specialties: General Surgery, Colon and Rectal Surgery Contact information: [address] [place] [address] [phone]         Tia Alert, MD .   Specialty: Neurosurgery Contact information: [address] [place] [address] [phone]                 Discharge Instructions     Diet - low sodium heart healthy   Complete by: As directed    Discharge instructions   Complete by: As directed    **IMPORTANT DISCHARGE INSTRUCTIONS**   From Dr. Maryfrances Bunnell: You were admitted for abdominal pain and diarrhea. You had a CT scan of the abdomen with contrast, and an MRI of the abdomen. These showed no signs of bowel obstruction, perforation, abscess/infection, and showed good blood flow to the colon. There was a question on the first CT image of appendicitis, but this clinically did not appear to be the case, and the MRI imaging also did not support it.  Our speculation is that this was a viral infection (a viral enterocolitis) that resolved.  I have very low suspicion for C diff infection.  You did have some very mild sodium and potassium changes on your last metabolic panel, but the kind that should resolve by themselves with a normal diet this week.  Go see your primary doctor in 1 week for repeat labs  For the nodule in the left lower  quadrant mesentery, follow the instructions from Dr. Maisie Fus (her office number for follow up is in the instructions below)  Call Dr. Barnett Applebaum office to make sure you have appropraite follow up for your back   Increase activity slowly   Complete by: As directed        Discharge Exam: There were no vitals filed for this visit.  General: Pt is alert, awake, not in acute distress Cardiovascular: RRR, nl S1-S2, no murmurs appreciated.   No LE edema.   Respiratory: Normal respiratory rate and rhythm.  CTAB without rales or wheezes. Abdominal: Abdomen soft and non-tender.  No distension or HSM.  Neuro/Psych: Strength symmetric in upper and lower extremities.  Judgment and insight appear normal.   Condition at discharge: good  The results of significant diagnostics from this hospitalization (including imaging, microbiology, ancillary and laboratory) are listed below for reference.   Imaging Studies: MR ABDOMEN W WO CONTRAST  Result Date: 07/07/2022 CLINICAL DATA:  Left lower quadrant mass on recent CT. EXAM: MRI ABDOMEN AND PELVIS WITHOUT AND WITH CONTRAST TECHNIQUE: Multiplanar multisequence MR imaging of the abdomen and pelvis was performed both before and after the administration of intravenous contrast. CONTRAST:  7mL GADAVIST GADOBUTROL 1 MMOL/ML IV SOLN COMPARISON:  CT on 07/05/2022 FINDINGS: COMBINED FINDINGS FOR BOTH MR ABDOMEN AND PELVIS Lower Chest: No acute findings. Hepatobiliary: No hepatic masses identified. Gallbladder sludge is seen without evidence of cholecystitis. No evidence of biliary ductal dilatation. Pancreas:  No mass or inflammatory changes. Spleen: Within normal limits in size and appearance. Adrenals/Urinary Tract: No suspicious masses identified. No evidence of ureteral calculi or hydronephrosis. Stomach/Bowel: A well-circumscribed soft tissue mass is seen in the left lower quadrant small bowel mesentery which shows mild homogeneous T1 and T2 hypointensity and  homogeneous contrast enhancement. This measures 2.4 x 2.4 cm on image 14/1503. Stomach is unremarkable in appearance. No evidence of bowel obstruction, inflammatory process, or abnormal fluid collections. Vascular/Lymphatic: No pathologically enlarged lymph nodes. No acute vascular findings. Reproductive: -- Uterus: Prior hysterectomy vaginal cuff is unremarkable in appearance. -- Right ovary: Not visualized, however no adnexal mass identified. -- Left ovary:  Not visualized, however no adnexal mass identified. Other: Tiny amount of free fluid noted in pelvic cul-de-sac, which is of doubtful clinical significance. Musculoskeletal:  No suspicious bone lesions identified. IMPRESSION: 2.4 cm soft tissue mass in the left lower quadrant small bowel mesentery, which has nonspecific imaging characteristics. Differential diagnosis includes GI stromal tumor, other mesenteric neoplasm, and less likely lymphadenopathy. No evidence of metastatic disease. Prior hysterectomy. No evidence of adnexal mass. Gallbladder sludge, without radiographic evidence of cholecystitis. Electronically Signed   By: Danae Orleans M.D.   On: 07/07/2022 11:51   MR PELVIS W WO CONTRAST  Result Date: 07/07/2022 CLINICAL DATA:  Left lower quadrant mass on recent CT. EXAM: MRI ABDOMEN AND PELVIS WITHOUT AND WITH CONTRAST TECHNIQUE: Multiplanar multisequence MR imaging of the abdomen and pelvis was performed both before and after the administration of intravenous contrast. CONTRAST:  7mL GADAVIST GADOBUTROL 1 MMOL/ML IV SOLN COMPARISON:  CT on 07/05/2022 FINDINGS: COMBINED FINDINGS FOR BOTH MR ABDOMEN AND PELVIS Lower Chest: No acute findings. Hepatobiliary: No hepatic masses identified. Gallbladder sludge is seen without evidence of cholecystitis. No evidence of biliary ductal dilatation. Pancreas:  No mass or inflammatory changes. Spleen: Within normal limits in size and appearance. Adrenals/Urinary Tract: No suspicious masses identified. No evidence  of ureteral calculi or hydronephrosis. Stomach/Bowel: A well-circumscribed soft tissue mass is seen in the left lower quadrant small bowel mesentery which shows mild homogeneous T1 and T2 hypointensity and homogeneous contrast enhancement. This measures 2.4 x 2.4 cm on image 14/1503. Stomach is unremarkable in appearance. No evidence of bowel obstruction, inflammatory process, or abnormal fluid collections. Vascular/Lymphatic: No pathologically enlarged lymph nodes. No acute vascular findings. Reproductive: -- Uterus: Prior hysterectomy vaginal cuff is unremarkable in appearance. -- Right ovary: Not visualized, however no adnexal mass identified. -- Left ovary:  Not visualized, however no adnexal mass identified. Other: Tiny amount of free fluid noted in pelvic cul-de-sac, which is of doubtful clinical significance. Musculoskeletal:  No suspicious bone lesions identified. IMPRESSION: 2.4 cm  soft tissue mass in the left lower quadrant small bowel mesentery, which has nonspecific imaging characteristics. Differential diagnosis includes GI stromal tumor, other mesenteric neoplasm, and less likely lymphadenopathy. No evidence of metastatic disease. Prior hysterectomy. No evidence of adnexal mass. Gallbladder sludge, without radiographic evidence of cholecystitis. Electronically Signed   By: Danae Orleans M.D.   On: 07/07/2022 11:51   CT ABDOMEN PELVIS W CONTRAST  Result Date: 07/05/2022 CLINICAL DATA:  Abdominal pain EXAM: CT ABDOMEN AND PELVIS WITH CONTRAST TECHNIQUE: Multidetector CT imaging of the abdomen and pelvis was performed using the standard protocol following bolus administration of intravenous contrast. RADIATION DOSE REDUCTION: This exam was performed according to the departmental dose-optimization program which includes automated exposure control, adjustment of the mA and/or kV according to patient size and/or use of iterative reconstruction technique. CONTRAST:  75mL OMNIPAQUE IOHEXOL 350 MG/ML SOLN  COMPARISON:  None Available. FINDINGS: Lower chest: No acute abnormality. Hepatobiliary: No focal liver abnormality is seen. No gallstones, gallbladder wall thickening, or biliary dilatation. Pancreas: Unremarkable. No pancreatic ductal dilatation or surrounding inflammatory changes. Spleen: 5 mm cystic focus along the inferior aspect of the spleen. Benign. No follow up. Spleen is not enlarged. Adrenals/Urinary Tract: Adrenal glands are unremarkable. Kidneys are normal, without renal calculi, or hydronephrosis. A few tiny cystic lesions identified bilateral, likely small cysts, less than 1 cm and too small to completely characterize. Bosniak 2. No follow up. Bladder is unremarkable. Stomach/Bowel: There is some debris in the stomach. There is high attenuation material in the stomach with questionable lesion in the midbody with a caliber change. Focal wall thickening in this location as well. This stomach also has some slight wall thickening. Stomach is underdistended. Small bowel is nondilated. Large bowel is nondilated. Scattered colonic diverticula. There is a rounded soft tissue mass in the left hemipelvis measuring 2.4 by 2.2 by 2.3 cm. This abuts a small bowel loop. Possibility would include a exophytic mass from the wall of the loop of bowel such as a gastrointestinal stromal tumor. There are other differential points as well. Please correlate with any prior or further workup is recommended. The appendix extends posterior to the cecum in the right hemipelvis but is dilated up to 11 mm on axial series 3, image 57. Sagittal series 7, image 50. Please correlate with any clinical presentation of acute appendicitis. Vascular/Lymphatic: Normal caliber aorta and IVC. Mild vascular calcifications. No specific abnormal lymph node enlargement identified in the abdomen and pelvis. Reproductive: Absence of the uterus. Ovaries are poorly seen. Please correlate with clinical history. Other: Mild anasarca.  No free intra-air  or free fluid. Musculoskeletal: Scattered degenerative changes of the spine and pelvis. Patient is status post left L4-5 hemilaminectomy. Involving the surgical area is a rim enhancing fluid collection. This extends into the central canal where there is a component of fluid seen narrowing of the thecal sac. Fluid along the canal extends from L4-5 down to S1. Please see sagittal series 7, image 66, axial series 3, image 35. This collection is without gas. Dimensions are measured at a proximally 7.8 by 3.5 by 5.3 cm. IMPRESSION: Enlarged appendix in the right hemipelvis extending posterior to the cecum measuring up to 11 mm. Minimal stranding. Please correlate for any clinical evidence of appendicitis. Soft tissue mass in the left hemipelvis measuring 2.4 cm. This abuts several adjacent small bowel loops. The uterus is not seen. The ovaries are poorly defined. This mass would have a differential including bowel pathology such as a stromal tumor. There  is a differential however including gynecologic etiologies. Please correlate with any prior imaging or dedicated workup when appropriate. Rim enhancing fluid collection identified at the lumbar spine posteriorly involving the subcutaneous tissues, musculature and extending into the central canal of the lumbar spine with potential mass effect along the thecal sac. Please correlate for any neurologic symptoms. This very well could be simply postoperative. There is no associated gas but in principle infection can not be excluded by CT alone. If there is further concern and additional characterization is indicated, MRI of the lumbar spine with and without contrast may be of benefit. Slightly atypical configuration of the midbody of the stomach with caliber change and focal thickening. Distal stomach also has more smooth wall thickening. Appears could be peristalsis. Please correlate for any specific gastric symptoms. Further workup when clinically appropriate with the oral  contrast exam. Critical Value/emergent results were called by telephone at the time of interpretation on 07/05/2022 at 4:48 pm to provider Maryanna Shape , who verbally acknowledged these results. Electronically Signed   By: Karen Kays M.D.   On: 07/05/2022 19:53   DG Lumbar Spine 2-3 Views  Result Date: 06/20/2022 CLINICAL DATA:  Intraoperative localization EXAM: LUMBAR SPINE - 2-3 VIEW COMPARISON:  02/06/2021 FINDINGS: Two cross-table images depicting instrumentation posterior to the L4-L5 disc level (image 1). On the second image, instrumentation is posterior to the L4 vertebral body. IMPRESSION: Intraoperative localization for L4-L5 surgery. Electronically Signed   By: Wiliam Ke M.D.   On: 06/20/2022 12:39    Microbiology: Results for orders placed or performed during the hospital encounter of 06/08/22  Surgical pcr screen     Status: None   Collection Time: 06/08/22  9:30 AM   Specimen: Nasal Mucosa; Nasal Swab  Result Value Ref Range Status   MRSA, PCR NEGATIVE NEGATIVE Final   Staphylococcus aureus NEGATIVE NEGATIVE Final    Comment: (NOTE) The Xpert SA Assay (FDA approved for NASAL specimens in patients 8 years of age and older), is one component of a comprehensive surveillance program. It is not intended to diagnose infection nor to guide or monitor treatment. Performed at [facility], [address], [place], [place] [address]     Labs: CBC: Recent Labs  Lab 07/05/22 1634 07/06/22 0406 07/07/22 0132 07/08/22 0333  WBC 15.6* 14.4* 9.8 7.1  NEUTROABS 13.7*  --   --   --   HGB 13.6 11.6* 10.5* 12.3  HCT 38.2 32.5* 31.0* 35.5*  MCV 90.7 91.8 93.4 91.7  PLT 298 235 211 243   Basic Metabolic Panel: Recent Labs  Lab 07/05/22 1634 07/05/22 1855 07/06/22 0406 07/07/22 0132 07/08/22 0333  NA 132*  --  134* 132* 130*  K 3.1*  --  3.7 3.8 3.3*  CL 98  --  105 106 99  CO2 17*  --  20* 19* 22  GLUCOSE 131*  --  109* 94 86  BUN 13  --  9 5* 5*  CREATININE  0.87  --  0.73 0.63 0.66  CALCIUM 9.6  --  7.7* 7.7* 8.4*  MG  --  1.6* 2.1  --   --   PHOS  --   --  2.9  --   --    Liver Function Tests: Recent Labs  Lab 07/05/22 1634 07/06/22 0406  AST 50* 39  ALT 22 19  ALKPHOS 45 37*  BILITOT 0.9 0.7  PROT 7.4 6.5  ALBUMIN 4.4 3.6   CBG: No results for  input(s): "GLUCAP" in the last 168 hours.  Discharge time spent: approximately 35 minutes spent on discharge counseling, evaluation of patient on day of discharge, and coordination of discharge planning with nursing, social work, pharmacy and case management  Signed: Alberteen Sam, MD [facility] 07/08/2022

## 2022-07-08 NOTE — Plan of Care (Signed)
Pt vitals stable. Denies pain. Prn simethicone and restoril given.  Problem: Education: Goal: Ability to verbalize activity precautions or restrictions will improve 07/08/2022 0732 by Moishe Spice, RN Outcome: Progressing 07/08/2022 0609 by Moishe Spice, RN Outcome: Progressing Goal: Knowledge of the prescribed therapeutic regimen will improve 07/08/2022 0732 by Moishe Spice, RN Outcome: Progressing 07/08/2022 0609 by Moishe Spice, RN Outcome: Progressing Goal: Understanding of discharge needs will improve 07/08/2022 0732 by Moishe Spice, RN Outcome: Progressing 07/08/2022 0609 by Moishe Spice, RN Outcome: Progressing   Problem: Activity: Goal: Ability to avoid complications of mobility impairment will improve 07/08/2022 0732 by Moishe Spice, RN Outcome: Progressing 07/08/2022 0609 by Moishe Spice, RN Outcome: Progressing Goal: Ability to tolerate increased activity will improve 07/08/2022 0732 by Moishe Spice, RN Outcome: Progressing 07/08/2022 0609 by Moishe Spice, RN Outcome: Progressing Goal: Will remain free from falls 07/08/2022 0732 by Moishe Spice, RN Outcome: Progressing 07/08/2022 0609 by Moishe Spice, RN Outcome: Progressing   Problem: Bowel/Gastric: Goal: Gastrointestinal status for postoperative course will improve 07/08/2022 0732 by Moishe Spice, RN Outcome: Progressing 07/08/2022 0609 by Moishe Spice, RN Outcome: Progressing   Problem: Clinical Measurements: Goal: Ability to maintain clinical measurements within normal limits will improve 07/08/2022 0732 by Moishe Spice, RN Outcome: Progressing 07/08/2022 0609 by Moishe Spice, RN Outcome: Progressing Goal: Postoperative complications will be avoided or minimized 07/08/2022 0732 by Moishe Spice, RN Outcome: Progressing 07/08/2022 0609 by Moishe Spice, RN Outcome: Progressing Goal: Diagnostic test results will improve 07/08/2022 0732 by Moishe Spice, RN Outcome:  Progressing 07/08/2022 0609 by Moishe Spice, RN Outcome: Progressing   Problem: Pain Management: Goal: Pain level will decrease 07/08/2022 0732 by Moishe Spice, RN Outcome: Progressing 07/08/2022 0609 by Moishe Spice, RN Outcome: Progressing   Problem: Skin Integrity: Goal: Will show signs of wound healing 07/08/2022 0732 by Moishe Spice, RN Outcome: Progressing 07/08/2022 0609 by Moishe Spice, RN Outcome: Progressing   Problem: Health Behavior/Discharge Planning: Goal: Identification of resources available to assist in meeting health care needs will improve 07/08/2022 0732 by Moishe Spice, RN Outcome: Progressing 07/08/2022 0609 by Moishe Spice, RN Outcome: Progressing   Problem: Bladder/Genitourinary: Goal: Urinary functional status for postoperative course will improve 07/08/2022 0732 by Moishe Spice, RN Outcome: Progressing 07/08/2022 0609 by Moishe Spice, RN Outcome: Progressing   Problem: Education: Goal: Knowledge of General Education information will improve Description: Including pain rating scale, medication(s)/side effects and non-pharmacologic comfort measures 07/08/2022 0732 by Moishe Spice, RN Outcome: Progressing 07/08/2022 0609 by Moishe Spice, RN Outcome: Progressing   Problem: Health Behavior/Discharge Planning: Goal: Ability to manage health-related needs will improve 07/08/2022 0732 by Moishe Spice, RN Outcome: Progressing 07/08/2022 0609 by Moishe Spice, RN Outcome: Progressing   Problem: Clinical Measurements: Goal: Ability to maintain clinical measurements within normal limits will improve 07/08/2022 0732 by Moishe Spice, RN Outcome: Progressing 07/08/2022 0609 by Moishe Spice, RN Outcome: Progressing Goal: Will remain free from infection 07/08/2022 0732 by Moishe Spice, RN Outcome: Progressing 07/08/2022 0609 by Moishe Spice, RN Outcome: Progressing Goal: Diagnostic test results will improve 07/08/2022  0732 by Moishe Spice, RN Outcome: Progressing 07/08/2022 0609 by Moishe Spice, RN Outcome: Progressing Goal: Respiratory complications will improve 07/08/2022 0732 by Moishe Spice, RN Outcome: Progressing 07/08/2022 0609 by Moishe Spice, RN Outcome: Progressing Goal: Cardiovascular complication will  be avoided 07/08/2022 0732 by Moishe Spice, RN Outcome: Progressing 07/08/2022 0609 by Moishe Spice, RN Outcome: Progressing   Problem: Activity: Goal: Risk for activity intolerance will decrease 07/08/2022 0732 by Moishe Spice, RN Outcome: Progressing 07/08/2022 0609 by Moishe Spice, RN Outcome: Progressing   Problem: Nutrition: Goal: Adequate nutrition will be maintained 07/08/2022 0732 by Moishe Spice, RN Outcome: Progressing 07/08/2022 0609 by Moishe Spice, RN Outcome: Progressing   Problem: Coping: Goal: Level of anxiety will decrease 07/08/2022 0732 by Moishe Spice, RN Outcome: Progressing 07/08/2022 0609 by Moishe Spice, RN Outcome: Progressing   Problem: Elimination: Goal: Will not experience complications related to bowel motility 07/08/2022 0732 by Moishe Spice, RN Outcome: Progressing 07/08/2022 0609 by Moishe Spice, RN Outcome: Progressing Goal: Will not experience complications related to urinary retention 07/08/2022 0732 by Moishe Spice, RN Outcome: Progressing 07/08/2022 0609 by Moishe Spice, RN Outcome: Progressing   Problem: Pain Managment: Goal: General experience of comfort will improve 07/08/2022 0732 by Moishe Spice, RN Outcome: Progressing 07/08/2022 0609 by Moishe Spice, RN Outcome: Progressing   Problem: Safety: Goal: Ability to remain free from injury will improve 07/08/2022 0732 by Moishe Spice, RN Outcome: Progressing 07/08/2022 0609 by Moishe Spice, RN Outcome: Progressing   Problem: Skin Integrity: Goal: Risk for impaired skin integrity will decrease 07/08/2022 0732 by Moishe Spice,  RN Outcome: Progressing 07/08/2022 0609 by Moishe Spice, RN Outcome: Progressing

## 2022-07-12 DIAGNOSIS — A084 Viral intestinal infection, unspecified: Secondary | ICD-10-CM | POA: Diagnosis not present

## 2022-07-12 DIAGNOSIS — E876 Hypokalemia: Secondary | ICD-10-CM | POA: Diagnosis not present

## 2022-07-12 DIAGNOSIS — K639 Disease of intestine, unspecified: Secondary | ICD-10-CM | POA: Diagnosis not present

## 2022-07-12 DIAGNOSIS — M7130 Other bursal cyst, unspecified site: Secondary | ICD-10-CM | POA: Diagnosis not present

## 2022-07-18 DIAGNOSIS — T8189XD Other complications of procedures, not elsewhere classified, subsequent encounter: Secondary | ICD-10-CM | POA: Diagnosis not present

## 2022-07-18 DIAGNOSIS — R251 Tremor, unspecified: Secondary | ICD-10-CM | POA: Diagnosis not present

## 2022-07-18 DIAGNOSIS — R609 Edema, unspecified: Secondary | ICD-10-CM | POA: Diagnosis not present

## 2022-07-18 DIAGNOSIS — R197 Diarrhea, unspecified: Secondary | ICD-10-CM | POA: Diagnosis not present

## 2022-08-03 ENCOUNTER — Ambulatory Visit: Payer: Self-pay | Admitting: General Surgery

## 2022-08-03 DIAGNOSIS — R1904 Left lower quadrant abdominal swelling, mass and lump: Secondary | ICD-10-CM | POA: Diagnosis not present

## 2022-08-03 NOTE — H&P (Signed)
REFERRING PHYSICIAN:  Lupita Raider, MD  PROVIDER:  Elenora Gamma, MD  MRN: [id number] DOB: 1950/07/08 DATE OF ENCOUNTER: 08/03/2022  Subjective   Chief Complaint: RE-CHECK      History of Present Illness: [name] is a 72 y.o. female who is seen today as an office consultation at the request of Dr. Clelia Croft for evaluation of RE-CHECK  .  Patient recently hospitalized for dilated appendix, vomiting, diarrhea and left lower quadrant pain.  She was determined not to have appendicitis by exam.  MRI was performed due to a soft tissue mass in the left lower quadrant.  This appears to be in the mesentery of the small bowel.  She is here for further discussion on treatment for this.   Review of Systems: A complete review of systems was obtained from the patient.  I have reviewed this information and discussed as appropriate with the patient.  See HPI as well for other ROS.    Medical History: Past Medical History:  Diagnosis Date   History of cancer    Thyroid disease     There is no problem list on file for this patient.   Past Surgical History:  Procedure Laterality Date   Lumbar laminectomy/decompression microdiscectomy  2024   Bladder suspension     Breast lumpectomy     ESSURE TUBAL LIGATION     HYSTERECTOMY     REVISION TOTAL KNEE ARTHROPLASTY       Allergies  Allergen Reactions   Gabapentin Other (See Comments)    Gave pt migraine  Migraines   Migraines    Current Outpatient Medications on File Prior to Visit  Medication Sig Dispense Refill   ALPRAZolam (XANAX) 0.5 MG tablet Take 0.5 mg by mouth at bedtime as needed for Sleep     cholecalciferol (VITAMIN D3) 1000 unit tablet Take 1,000 Units by mouth once daily     coenzyme Q10 400 mg Cap Take 400 mg by mouth once daily     denosumab (PROLIA) 60 mg/mL inj syringe Inject 1 mL by subcutaneous route.     levothyroxine (SYNTHROID) 75 MCG tablet TAKE 1 TABLET BY MOUTH EVERY DAY IN THE MORNING ON  EMPTY STOMACH     minoxidiL 2.5 MG tablet TAKE 1/2 TABLET BY MOUTH ONCE A DAY TAKE DAILY AS DIRECTED     omega-3 fatty acids-fish oil 300-1,000 mg capsule Take by mouth     simvastatin (ZOCOR) 20 MG tablet TAKE 1 TABLET BY MOUTH EVERY DAY IN THE EVENING FOR 90 DAYS     temazepam (RESTORIL) 15 mg capsule 1-2 CAPSULES AT BEDTIME ORALLY AS NEEDED FOR SLEEP     No current facility-administered medications on file prior to visit.    History reviewed. No pertinent family history.   Social History   Tobacco Use  Smoking Status Never  Smokeless Tobacco Never     Social History   Socioeconomic History   Marital status: Married  Tobacco Use   Smoking status: Never   Smokeless tobacco: Never  Substance and Sexual Activity   Alcohol use: Yes   Drug use: Never    Objective:    Vitals:   08/03/22 1357 08/03/22 1358  BP: (!) 169/82   Pulse: 80   Temp: 36.7 C (98 F)   SpO2: 99%   Weight: 59.5 kg (131 lb 3.2 oz)   Height: 165.1 cm (5\' 5" )   PainSc:  0-No pain     Exam Gen: NAD Abd: soft, NT to  palpation    Labs, Imaging and Diagnostic Testing: CT and MRI images and report reviewed  Assessment and Plan:  Diagnoses and all orders for this visit:  Left lower quadrant abdominal mass     72 year old female who recently hospitalized for some left lower quadrant pain nausea and diarrhea.  This has resolved.  On her CT scan a left lower quadrant mass was noted.  MRI was obtained to further evaluate this.  It appeared to be in the mesentery of the small bowel distally.  It is well-circumscribed and does not appear to be involving any other structures.  We discussed that diagnostic laparoscopy would be the next best step.  We would plan on excision of the mass and possible small bowel resection.  Given that she had a dilated appendix recently, we will also evaluate the appendix and perform an appendectomy if needed.  We have discussed the risk of this which include bleeding  infection and damage to adjacent structures I think all of these risks are very minimal.  We also discussed risk of wound infection and hernias.  We discussed that timing of the surgery is not significantly critical, as this appears to be a slow-growing mass, but I would recommend that she have this done within the next few months.  Vanita Panda, MD Colon and Rectal Surgery [facility]

## 2022-08-06 ENCOUNTER — Other Ambulatory Visit: Payer: Self-pay | Admitting: Family Medicine

## 2022-08-06 DIAGNOSIS — C50911 Malignant neoplasm of unspecified site of right female breast: Secondary | ICD-10-CM | POA: Diagnosis not present

## 2022-08-06 DIAGNOSIS — Z1231 Encounter for screening mammogram for malignant neoplasm of breast: Secondary | ICD-10-CM

## 2022-08-24 ENCOUNTER — Ambulatory Visit
Admission: RE | Admit: 2022-08-24 | Discharge: 2022-08-24 | Disposition: A | Discharge status: Home / Self Care | Payer: Medicare PPO | Source: Ambulatory Visit | Attending: Family Medicine | Admitting: Family Medicine

## 2022-08-24 DIAGNOSIS — Z1231 Encounter for screening mammogram for malignant neoplasm of breast: Secondary | ICD-10-CM

## 2022-09-07 DIAGNOSIS — G4700 Insomnia, unspecified: Secondary | ICD-10-CM | POA: Diagnosis not present

## 2022-09-07 DIAGNOSIS — F411 Generalized anxiety disorder: Secondary | ICD-10-CM | POA: Diagnosis not present

## 2022-09-07 DIAGNOSIS — Z0000 Encounter for general adult medical examination without abnormal findings: Secondary | ICD-10-CM | POA: Diagnosis not present

## 2022-09-07 DIAGNOSIS — D649 Anemia, unspecified: Secondary | ICD-10-CM | POA: Diagnosis not present

## 2022-09-07 DIAGNOSIS — M810 Age-related osteoporosis without current pathological fracture: Secondary | ICD-10-CM | POA: Diagnosis not present

## 2022-09-07 DIAGNOSIS — E039 Hypothyroidism, unspecified: Secondary | ICD-10-CM | POA: Diagnosis not present

## 2022-09-07 DIAGNOSIS — E7800 Pure hypercholesterolemia, unspecified: Secondary | ICD-10-CM | POA: Diagnosis not present

## 2022-09-11 DIAGNOSIS — M549 Dorsalgia, unspecified: Secondary | ICD-10-CM | POA: Diagnosis not present

## 2022-09-11 DIAGNOSIS — R1031 Right lower quadrant pain: Secondary | ICD-10-CM | POA: Diagnosis not present

## 2022-09-11 DIAGNOSIS — K639 Disease of intestine, unspecified: Secondary | ICD-10-CM | POA: Diagnosis not present

## 2022-09-13 ENCOUNTER — Encounter: Payer: Self-pay | Admitting: Physician Assistant

## 2022-09-13 ENCOUNTER — Other Ambulatory Visit: Payer: Self-pay | Admitting: Physician Assistant

## 2022-09-13 DIAGNOSIS — R1031 Right lower quadrant pain: Secondary | ICD-10-CM

## 2022-09-13 DIAGNOSIS — R710 Precipitous drop in hematocrit: Secondary | ICD-10-CM

## 2022-09-18 ENCOUNTER — Ambulatory Visit
Admission: RE | Admit: 2022-09-18 | Discharge: 2022-09-18 | Disposition: A | Discharge status: Home / Self Care | Payer: Medicare PPO | Source: Ambulatory Visit | Attending: Physician Assistant | Admitting: Physician Assistant

## 2022-09-18 DIAGNOSIS — R710 Precipitous drop in hematocrit: Secondary | ICD-10-CM

## 2022-09-18 DIAGNOSIS — K389 Disease of appendix, unspecified: Secondary | ICD-10-CM | POA: Diagnosis not present

## 2022-09-18 DIAGNOSIS — R1031 Right lower quadrant pain: Secondary | ICD-10-CM | POA: Diagnosis not present

## 2022-09-18 DIAGNOSIS — R1904 Left lower quadrant abdominal swelling, mass and lump: Secondary | ICD-10-CM | POA: Diagnosis not present

## 2022-09-18 MED ORDER — IOPAMIDOL (ISOVUE-300) INJECTION 61%
100.0000 mL | Freq: Once | INTRAVENOUS | Status: AC | PRN
  Administered 2022-09-18: 100 mL via INTRAVENOUS

## 2022-09-24 ENCOUNTER — Other Ambulatory Visit: Payer: Medicare PPO

## 2022-09-27 ENCOUNTER — Ambulatory Visit: Payer: Medicare PPO | Admitting: Family Medicine

## 2022-09-27 VITALS — BP 114/76 | Ht 65.0 in | Wt 130.0 lb

## 2022-09-27 DIAGNOSIS — M545 Low back pain, unspecified: Secondary | ICD-10-CM | POA: Diagnosis not present

## 2022-09-27 NOTE — Progress Notes (Signed)
PCP: Lupita Raider, MD  Subjective:   HPI: Patient is a 72 y.o. female here for low back pain.  Previously seen 4/1 for L4-5 severe facet arthropathy with left intraspinal synovial cyst.  She underwent laminectomy on 4/17 with Dr. Yetta Barre.  After her surgery, her radicular pain resolved.  However, she is still having some intermittent back spasms/pain usually associated with carrying heavy items or walking/standing. Having to use advil once or twice daily. Denies numbness, tingling, saddle anesthesia, bowel/bladder changes.  She has been going to the gym and has a Systems analyst.  She mainly wants to know if there are any exercises that could help with her pain.   Past Medical History:  Diagnosis Date   Anal fissure    Breast cancer, right breast (HCC) 11/1997   S/P lumphectomy, chemo,  and radiation therapy   Broken arm 08/2014   right   Hypercholesteremia    Hypothyroidism    Osteoarthritis    "knees; maybe in my lower back" (04/23/2017)   Personal history of chemotherapy    Personal history of radiation therapy    PONV (postoperative nausea and vomiting)    Thyroid disease    hypothyroid    Current Outpatient Medications on File Prior to Visit  Medication Sig Dispense Refill   acetaminophen (TYLENOL) 500 MG tablet Take 1,000 mg by mouth as needed for moderate pain.     ALPRAZolam (XANAX) 0.5 MG tablet Take 0.5 mg by mouth 2 (two) times daily as needed for anxiety.     bismuth subsalicylate (PEPTO BISMOL) 262 MG chewable tablet Chew 1,048 mg by mouth as needed for indigestion or diarrhea or loose stools.     Calcium Carbonate-Vit D-Min (CALTRATE MINIS PLUS MINERALS PO) Take 1 tablet by mouth in the morning and at bedtime.     cholecalciferol (VITAMIN D3) 25 MCG (1000 UNIT) tablet Take 1,000 Units by mouth daily.     clobetasol ointment (TEMOVATE) 0.05 % Apply 1 application  topically daily as needed (irritation).     Coenzyme Q10 (COQ10) 400 MG CAPS Take 400 mg by mouth daily.      denosumab (PROLIA) 60 MG/ML SOLN injection Inject 60 mg into the skin every 6 (six) months. Administer in upper arm, thigh, or abdomen     ibuprofen (ADVIL) 200 MG tablet Take 800 mg by mouth as needed for moderate pain.     levothyroxine (SYNTHROID, LEVOTHROID) 75 MCG tablet Take 75 mcg by mouth daily before breakfast.      loperamide (IMODIUM A-D) 2 MG tablet Take 6 mg by mouth as needed for diarrhea or loose stools.     minoxidil (LONITEN) 2.5 MG tablet Take 1.25 mg by mouth daily.     Omega-3 Fatty Acids (FISH OIL ULTRA) 1400 MG CAPS Take 1,400 mg by mouth daily.     ondansetron (ZOFRAN) 4 MG tablet Take 1 tablet (4 mg total) by mouth daily as needed for nausea or vomiting. 24 tablet 1   simvastatin (ZOCOR) 20 MG tablet Take 20 mg by mouth at bedtime.     temazepam (RESTORIL) 15 MG capsule Take 15-30 mg by mouth at bedtime as needed for sleep.     No current facility-administered medications on file prior to visit.    Past Surgical History:  Procedure Laterality Date   BLADDER SUSPENSION  02/2002   sparc   BREAST BIOPSY Right 1999   BREAST LUMPECTOMY Right 11/1997   EYE SURGERY Bilateral    bilateral cataract removal  LUMBAR LAMINECTOMY/DECOMPRESSION MICRODISCECTOMY Left 06/20/2022   Procedure: Left Lumbar Four-Five Hemilaminectomy with resection of synovial cyst;  Surgeon: Tia Alert, MD;  Location: [facility] OR;  Service: Neurosurgery;  Laterality: Left;  3C   MOUTH SURGERY     "dental implants"   NASAL SINUS SURGERY  12/2015   TOTAL KNEE ARTHROPLASTY Right 04/22/2017   Procedure: TOTAL KNEE ARTHROPLASTY;  Surgeon: Gean Birchwood, MD;  Location: [facility] OR;  Service: Orthopedics;  Laterality: Right;   TOTAL KNEE ARTHROPLASTY Left 04/2019   TUBAL LIGATION     VAGINAL HYSTERECTOMY  02/2002   LAVH-BSO    Allergies  Allergen Reactions   Gabapentin Other (See Comments)    Gave pt migraine    BP 114/76   Ht 5\' 5"  (1.651 m)   Wt 130 lb (59 kg)   LMP 03/05/1997   BMI 21.63 kg/m       01/23/2021    8:45 AM  [facility] Adult Exercise  Frequency of aerobic exercise (# of days/week) 4  Average time in minutes 30  Frequency of strengthening activities (# of days/week) 3        No data to display              Objective:  Physical Exam:  Gen: NAD, comfortable in exam room  Back and L hip: Inspection: No gross deformity, ecchymoses, swelling Palpation: Nontender to palpation along vertebral spinous processes.  Mild tenderness along right paraspinal muscles and upper right gluteal muscles.  Nontender throughout hip ROM: Full range of motion without pain Strength: 5/5 strength of hip abduction, adduction, flexion, extension Special tests: Negative FABER/FADIR   Assessment & Plan:  1.  Low back pain 2/2 likely lumbar muscle strain/spasm: No red flags noted.  Provided home exercises to promote strengthening.  Her overall pain is improved after her surgery back in April.  Continue as needed ibuprofen/naproxen.  Follow-up as needed

## 2022-10-01 ENCOUNTER — Encounter: Payer: Self-pay | Admitting: Family Medicine

## 2022-10-08 DIAGNOSIS — R945 Abnormal results of liver function studies: Secondary | ICD-10-CM | POA: Diagnosis not present

## 2022-11-12 NOTE — Progress Notes (Signed)
Sent message, via epic in basket, requesting orders in epic from surgeon.  

## 2022-11-15 ENCOUNTER — Ambulatory Visit: Payer: Self-pay | Admitting: General Surgery

## 2022-11-16 NOTE — Patient Instructions (Signed)
DUE TO COVID-19 ONLY TWO VISITORS  (aged 74 and older)  ARE ALLOWED TO COME WITH YOU AND STAY IN THE WAITING ROOM ONLY DURING PRE OP AND PROCEDURE.   **NO VISITORS ARE ALLOWED IN THE SHORT STAY AREA OR RECOVERY ROOM!!**  IF YOU WILL BE ADMITTED INTO THE HOSPITAL YOU ARE ALLOWED ONLY FOUR SUPPORT PEOPLE DURING VISITATION HOURS ONLY (7 AM -8PM)   The support person(s) must pass our screening, gel in and out, and wear a mask at all times, including in the patient's room. Patients must also wear a mask when staff or their support person are in the room. Visitors GUEST BADGE MUST BE WORN VISIBLY  One adult visitor may remain with you overnight and MUST be in the room by 8 P.M.     Your procedure is scheduled on: 11/30/22   Report to [facility] Main Entrance    Report to admitting at : 5:15 AM   Call this number if you have problems the morning of surgery [phone]  Eat a light diet the day before surgery.  Examples including soups, broths, toast, yogurt, mashed potatoes.  Things to avoid include carbonated beverages (fizzy beverages), raw fruits and raw vegetables, or beans.   If your bowels are filled with gas, your surgeon will have difficulty visualizing your pelvic organs which increases your surgical risks.   Do not eat food or drink: After Midnight.  FOLLOW BOWEL PREP AND ANY ADDITIONAL PRE OP INSTRUCTIONS YOU RECEIVED FROM YOUR SURGEON'S OFFICE!!!   Oral Hygiene is also important to reduce your risk of infection.                                    Remember - BRUSH YOUR TEETH THE MORNING OF SURGERY WITH YOUR REGULAR TOOTHPASTE  DENTURES WILL BE REMOVED PRIOR TO SURGERY PLEASE DO NOT APPLY "Poly grip" OR ADHESIVES!!!   Do NOT smoke after Midnight   Take these medicines the morning of surgery with A SIP OF WATER: minoxidil,levothyroxine.Tylenol,alprazolam as needed.                              You may not have any metal on your body including hair pins, jewelry, and  body piercing             Do not wear make-up, lotions, powders, perfumes/cologne, or deodorant  Do not wear nail polish including gel and S&S, artificial/acrylic nails, or any other type of covering on natural nails including finger and toenails. If you have artificial nails, gel coating, etc. that needs to be removed by a nail salon please have this removed prior to surgery or surgery may need to be canceled/ delayed if the surgeon/ anesthesia feels like they are unable to be safely monitored.   Do not shave  48 hours prior to surgery.    Do not bring valuables to the hospital. [facility] IS NOT             RESPONSIBLE   FOR VALUABLES.   Contacts, glasses, or bridgework may not be worn into surgery.   Bring small overnight bag day of surgery.   DO NOT BRING YOUR HOME MEDICATIONS TO THE HOSPITAL. PHARMACY WILL DISPENSE MEDICATIONS LISTED ON YOUR MEDICATION LIST TO YOU DURING YOUR ADMISSION IN THE HOSPITAL!    Patients discharged on the day of surgery will not  be allowed to drive home.  Someone NEEDS to stay with you for the first 24 hours after anesthesia.   Special Instructions: Bring a copy of your healthcare power of attorney and living will documents         the day of surgery if you haven't scanned them before.              Please read over the following fact sheets you were given: IF YOU HAVE QUESTIONS ABOUT YOUR PRE-OP INSTRUCTIONS PLEASE CALL [phone]    [facility] - Preparing for Surgery Before surgery, you can play an important role.  Because skin is not sterile, your skin needs to be as free of germs as possible.  You can reduce the number of germs on your skin by washing with CHG (chlorahexidine gluconate) soap before surgery.  CHG is an antiseptic cleaner which kills germs and bonds with the skin to continue killing germs even after washing. Please DO NOT use if you have an allergy to CHG or antibacterial soaps.  If your skin becomes reddened/irritated stop using the CHG  and inform your nurse when you arrive at Short Stay. Do not shave (including legs and underarms) for at least 48 hours prior to the first CHG shower.  You may shave your face/neck. Please follow these instructions carefully:  1.  Shower with CHG Soap the night before surgery and the  morning of Surgery.  2.  If you choose to wash your hair, wash your hair first as usual with your  normal  shampoo.  3.  After you shampoo, rinse your hair and body thoroughly to remove the  shampoo.                           4.  Use CHG as you would any other liquid soap.  You can apply chg directly  to the skin and wash                       Gently with a scrungie or clean washcloth.  5.  Apply the CHG Soap to your body ONLY FROM THE NECK DOWN.   Do not use on face/ open                           Wound or open sores. Avoid contact with eyes, ears mouth and genitals (private parts).                       Wash face,  Genitals (private parts) with your normal soap.             6.  Wash thoroughly, paying special attention to the area where your surgery  will be performed.  7.  Thoroughly rinse your body with warm water from the neck down.  8.  DO NOT shower/wash with your normal soap after using and rinsing off  the CHG Soap.                9.  Pat yourself dry with a clean towel.            10.  Wear clean pajamas.            11.  Place clean sheets on your bed the night of your first shower and do not  sleep with pets. Day of Surgery : Do not apply any lotions/deodorants the  morning of surgery.  Please wear clean clothes to the hospital/surgery center.  FAILURE TO FOLLOW THESE INSTRUCTIONS MAY RESULT IN THE CANCELLATION OF YOUR SURGERY PATIENT SIGNATURE_________________________________  NURSE SIGNATURE__________________________________  ________________________________________________________________________

## 2022-11-17 DIAGNOSIS — Z23 Encounter for immunization: Secondary | ICD-10-CM | POA: Diagnosis not present

## 2022-11-19 ENCOUNTER — Encounter (HOSPITAL_COMMUNITY)
Admission: RE | Admit: 2022-11-19 | Discharge: 2022-11-19 | Disposition: A | Discharge status: Home / Self Care | Payer: Medicare PPO | Source: Ambulatory Visit | Attending: Anesthesiology | Admitting: Anesthesiology

## 2022-11-19 ENCOUNTER — Encounter (HOSPITAL_COMMUNITY): Admission: RE | Admit: 2022-11-19 | Payer: Medicare PPO | Source: Ambulatory Visit

## 2022-11-19 ENCOUNTER — Encounter (HOSPITAL_COMMUNITY): Payer: Self-pay

## 2022-11-19 DIAGNOSIS — Z01818 Encounter for other preprocedural examination: Secondary | ICD-10-CM

## 2022-11-21 ENCOUNTER — Ambulatory Visit: Payer: Medicare PPO | Admitting: Family Medicine

## 2022-11-21 VITALS — BP 124/70 | Ht 65.0 in | Wt 130.0 lb

## 2022-11-21 DIAGNOSIS — M5010 Cervical disc disorder with radiculopathy, unspecified cervical region: Secondary | ICD-10-CM

## 2022-11-21 MED ORDER — DICLOFENAC SODIUM 75 MG PO TBEC: 75.0000 mg | DELAYED_RELEASE_TABLET | Freq: Two times a day (BID) | ORAL | 1 refills | Status: AC

## 2022-11-21 NOTE — Patient Instructions (Signed)
You have cervical radiculopathy (a pinched nerve in the neck). Diclofenac 75mg  twice a day with food for pain and inflammation. Consider cervical collar if severely painful. Do isometric exercises daily. Consider physical therapy for stretching, exercises, traction, and modalities. Heat 15 minutes at a time 3-4 times a day to help with spasms. Watch head position when on computers, texting, when sleeping in bed - should in line with back to prevent further nerve traction and irritation. Consider home traction unit if you get benefit with this in physical therapy. If not improving we will consider an MRI. Follow up with me in 1 month but call me sooner if you're struggling.

## 2022-11-22 ENCOUNTER — Encounter: Payer: Self-pay | Admitting: Family Medicine

## 2022-11-22 NOTE — Progress Notes (Signed)
PCP: Lupita Raider, MD  Subjective:   HPI: Patient is a 72 y.o. female here for left arm pain/tingling.  Patient reports for past several weeks she's developed tingling that intermittently occurs in her left arm. Has started to occur in all fingers. However over past weeks it's progressed to include forearm and now up to shoulder. Worse when lying on her right side. Seems to occur randomly. No symptoms into right arm. No bowel/bladder dysfunction.  Past Medical History:  Diagnosis Date   Anal fissure    Breast cancer, right breast (HCC) 11/1997   S/P lumphectomy, chemo,  and radiation therapy   Broken arm 08/2014   right   Hypercholesteremia    Hypothyroidism    Osteoarthritis    "knees; maybe in my lower back" (04/23/2017)   Personal history of chemotherapy    Personal history of radiation therapy    PONV (postoperative nausea and vomiting)    Thyroid disease    hypothyroid    Current Outpatient Medications on File Prior to Visit  Medication Sig Dispense Refill   acetaminophen (TYLENOL) 500 MG tablet Take 1,000 mg by mouth as needed for moderate pain.     ALPRAZolam (XANAX) 0.5 MG tablet Take 0.25-0.5 mg by mouth 2 (two) times daily as needed for anxiety.     ascorbic acid (VITAMIN C) 500 MG tablet Take 500 mg by mouth in the morning.     Calcium Carbonate-Vit D-Min (CALTRATE MINIS PLUS MINERALS PO) Take 1 tablet by mouth in the morning and at bedtime.     Cholecalciferol (VITAMIN D) 125 MCG (5000 UT) CAPS Take 5,000 Units by mouth in the morning.     clobetasol ointment (TEMOVATE) 0.05 % Apply 1 application  topically daily as needed (irritation).     Coenzyme Q10 (COQ10) 400 MG CAPS Take 400 mg by mouth in the morning.     denosumab (PROLIA) 60 MG/ML SOLN injection Inject 60 mg into the skin every 6 (six) months. Administer in upper arm, thigh, or abdomen     ibuprofen (ADVIL) 200 MG tablet Take 800 mg by mouth every 8 (eight) hours as needed (pain.).     levothyroxine  (SYNTHROID, LEVOTHROID) 75 MCG tablet Take 75 mcg by mouth daily before breakfast.      minoxidil (LONITEN) 2.5 MG tablet Take 1.25 mg by mouth in the morning.     Omega-3 Fatty Acids (FISH OIL ULTRA PO) Take 1,000 mg by mouth in the morning.     simvastatin (ZOCOR) 20 MG tablet Take 20 mg by mouth at bedtime.     temazepam (RESTORIL) 15 MG capsule Take 15-30 mg by mouth at bedtime.     tretinoin (RETIN-A) 0.05 % cream Apply 1 Application topically at bedtime.     No current facility-administered medications on file prior to visit.    Past Surgical History:  Procedure Laterality Date   BLADDER SUSPENSION  02/2002   sparc   BREAST BIOPSY Right 1999   BREAST LUMPECTOMY Right 11/1997   EYE SURGERY Bilateral    bilateral cataract removal   LUMBAR LAMINECTOMY/DECOMPRESSION MICRODISCECTOMY Left 06/20/2022   Procedure: Left Lumbar Four-Five Hemilaminectomy with resection of synovial cyst;  Surgeon: Tia Alert, MD;  Location: [facility] OR;  Service: Neurosurgery;  Laterality: Left;  3C   MOUTH SURGERY     "dental implants"   NASAL SINUS SURGERY  12/2015   TOTAL KNEE ARTHROPLASTY Right 04/22/2017   Procedure: TOTAL KNEE ARTHROPLASTY;  Surgeon: Gean Birchwood, MD;  Location: [facility]  OR;  Service: Orthopedics;  Laterality: Right;   TOTAL KNEE ARTHROPLASTY Left 04/2019   TUBAL LIGATION     VAGINAL HYSTERECTOMY  02/2002   LAVH-BSO    Allergies  Allergen Reactions   Gabapentin Other (See Comments)    Gave pt migraine    BP 124/70   Ht 5\' 5"  (1.651 m)   Wt 130 lb (59 kg)   LMP 03/05/1997   BMI 21.63 kg/m      01/23/2021    8:45 AM  [facility] Adult Exercise  Frequency of aerobic exercise (# of days/week) 4  Average time in minutes 30  Frequency of strengthening activities (# of days/week) 3        No data to display              Objective:  Physical Exam:  Gen: NAD, comfortable in exam room  Neck: No gross deformity, swelling, bruising. TTP bilateral cervical  paraspinal regions.  No midline/bony TTP. FROM. BUE strength 5/5.   Sensation intact to light touch.   2+ equal reflexes in biceps, brachioradialis tendons.  2+ right, Trace left triceps reflex. Negative spurlings.  Left arm: No deformity. FROM with 5/5 strength. No tenderness to palpation. NVI distally. Negative tinels at cubital tunnel, carpal tunnel, guyon's canal.  Negative phalens.   Assessment & Plan:  1. Left arm pain/numbness - consistent with cervical radiculopathy though mild, intermittent.  Discussed options.  Start with diclofenac twice a day with food.  Isometric neck exercises.  Heat, ergonomic issues discussed.  Consider physical therapy, prednisone dose pack, MRI  if not improving.  F/u in 1 month.

## 2022-12-07 ENCOUNTER — Encounter: Payer: Self-pay | Admitting: Family Medicine

## 2023-01-02 NOTE — Progress Notes (Addendum)
COVID Vaccine received:  []  No [x]  Yes Date of any COVID positive Test in last 90 days: no PCP - Lupita Raider MD Cardiologist - no  Chest x-ray -  EKG -  07/06/22 Epic Stress Test -  ECHO -  Cardiac Cath -   Bowel Prep - [x]  No  []   Yes ______  Pacemaker / ICD device [x]  No []  Yes   Spinal Cord Stimulator:[x]  No []  Yes       History of Sleep Apnea? [x]  No []  Yes   CPAP used?- [x]  No []  Yes    Does the patient monitor blood sugar?          [x]  No []  Yes  []  N/A  Patient has: [x]  NO Hx DM   []  Pre-DM                 []  DM1  []   DM2 Does patient have a [employer] or Dexacom? []  No []  Yes   Fasting Blood Sugar Ranges-  Checks Blood Sugar _____ times a day  GLP1 agonist / usual dose - no GLP1 instructions:  SGLT-2 inhibitors / usual dose - no SGLT-2 instructions:   Blood Thinner / Instructions:no Aspirin Instructions:no  Comments:   Activity level: Patient is able to climb a flight of stairs without difficulty; [x]  No CP  [x]  No SOB,  ___   Patient can  perform ADLs without assistance.   Anesthesia review:   Patient denies shortness of breath, fever, cough and chest pain at PAT appointment.  Patient verbalized understanding and agreement to the Pre-Surgical Instructions that were given to them at this PAT appointment. Patient was also educated of the need to review these PAT instructions again prior to his/her surgery.I reviewed the appropriate phone numbers to call if they have any and questions or concerns.

## 2023-01-02 NOTE — Patient Instructions (Addendum)
SURGICAL WAITING ROOM VISITATION  Patients having surgery or a procedure may have no more than 2 support people in the waiting area - these visitors may rotate.    Children under the age of 2 must have an adult with them who is not the patient.  Due to an increase in RSV and influenza rates and associated hospitalizations, children ages 38 and under may not visit patients in [facility] hospitals.  If the patient needs to stay at the hospital during part of their recovery, the visitor guidelines for inpatient rooms apply. Pre-op nurse will coordinate an appropriate time for 1 support person to accompany patient in pre-op.  This support person may not rotate.    Please refer to the [facility] website for the visitor guidelines for Inpatients (after your surgery is over and you are in a regular room).       Your procedure is scheduled on: 01/17/23   Report to [facility] Main Entrance    Report to admitting at 1:15 pm   Call this number if you have problems the morning of surgery [phone]   Do not eat food  or drink liquids :After Midnight.   FOLLOW BOWEL PREP AND ANY ADDITIONAL PRE OP INSTRUCTIONS YOU RECEIVED FROM YOUR SURGEON'S OFFICE!!!     Oral Hygiene is also important to reduce your risk of infection.                                    Remember - BRUSH YOUR TEETH THE MORNING OF SURGERY WITH YOUR REGULAR TOOTHPASTE   Stop all vitamins and herbal supplements 7 days before surgery.   Take these medicines the morning of surgery with A SIP OF WATER: Synthroid, Minoxidil, Simvastatin, Xanax if needed, tylenol if needed.             You may not have any metal on your body including hair pins, jewelry, and body piercing             Do not wear make-up, lotions, powders, perfumes/cologne, or deodorant  Do not wear nail polish including gel and S&S, artificial/acrylic nails, or any other type of covering on natural nails including finger and toenails. If you have  artificial nails, gel coating, etc. that needs to be removed by a nail salon please have this removed prior to surgery or surgery may need to be canceled/ delayed if the surgeon/ anesthesia feels like they are unable to be safely monitored.   Do not shave  48 hours prior to surgery.    Do not bring valuables to the hospital. [place] IS NOT             RESPONSIBLE   FOR VALUABLES.   Contacts, glasses, dentures or bridgework may not be worn into surgery.   Bring small overnight bag day of surgery.   DO NOT BRING YOUR HOME MEDICATIONS TO THE HOSPITAL. PHARMACY WILL DISPENSE MEDICATIONS LISTED ON YOUR MEDICATION LIST TO YOU DURING YOUR ADMISSION IN THE HOSPITAL!    Patients discharged on the day of surgery will not be allowed to drive home.  Someone NEEDS to stay with you for the first 24 hours after anesthesia.   Special Instructions: Bring a copy of your healthcare power of attorney and living will documents the day of surgery if you haven't scanned them before.  Please read over the following fact sheets you were given: IF YOU HAVE QUESTIONS ABOUT YOUR PRE-OP INSTRUCTIONS PLEASE CALL [phone] [name]   If you received a COVID test during your pre-op visit  it is requested that you wear a mask when out in public, stay away from anyone that may not be feeling well and notify your surgeon if you develop symptoms. If you test positive for Covid or have been in contact with anyone that has tested positive in the last 10 days please notify you surgeon.    [place] - Preparing for Surgery Before surgery, you can play an important role.  Because skin is not sterile, your skin needs to be as free of germs as possible.  You can reduce the number of germs on your skin by washing with CHG (chlorahexidine gluconate) soap before surgery.  CHG is an antiseptic cleaner which kills germs and bonds with the skin to continue killing germs even after washing. Please DO NOT use if you have  an allergy to CHG or antibacterial soaps.  If your skin becomes reddened/irritated stop using the CHG and inform your nurse when you arrive at Short Stay. Do not shave (including legs and underarms) for at least 48 hours prior to the first CHG shower.  You may shave your face/neck.  Please follow these instructions carefully:  1.  Shower with CHG Soap the night before surgery and the  morning of surgery.  2.  If you choose to wash your hair, wash your hair first as usual with your normal  shampoo.  3.  After you shampoo, rinse your hair and body thoroughly to remove the shampoo.                             4.  Use CHG as you would any other liquid soap.  You can apply chg directly to the skin and wash.  Gently with a scrungie or clean washcloth.  5.  Apply the CHG Soap to your body ONLY FROM THE NECK DOWN.   Do   not use on face/ open                           Wound or open sores. Avoid contact with eyes, ears mouth and   genitals (private parts).                       Wash face,  Genitals (private parts) with your normal soap.             6.  Wash thoroughly, paying special attention to the area where your    surgery  will be performed.  7.  Thoroughly rinse your body with warm water from the neck down.  8.  DO NOT shower/wash with your normal soap after using and rinsing off the CHG Soap.                9.  Pat yourself dry with a clean towel.            10.  Wear clean pajamas.            11.  Place clean sheets on your bed the night of your first shower and do not  sleep with pets. Day of Surgery : Do not apply any lotions/deodorants the morning of surgery.  Please wear clean clothes to the hospital/surgery center.  FAILURE TO FOLLOW THESE INSTRUCTIONS MAY RESULT IN THE CANCELLATION OF YOUR SURGERY  PATIENT SIGNATURE_________________________________  NURSE SIGNATURE__________________________________  ________________________________________________________________________

## 2023-01-08 ENCOUNTER — Other Ambulatory Visit: Payer: Self-pay

## 2023-01-08 ENCOUNTER — Encounter (HOSPITAL_COMMUNITY)
Admission: RE | Admit: 2023-01-08 | Discharge: 2023-01-08 | Disposition: A | Discharge status: Home / Self Care | Payer: Medicare PPO | Source: Ambulatory Visit | Attending: General Surgery | Admitting: General Surgery

## 2023-01-08 DIAGNOSIS — Z01812 Encounter for preprocedural laboratory examination: Secondary | ICD-10-CM | POA: Insufficient documentation

## 2023-01-08 DIAGNOSIS — Z01818 Encounter for other preprocedural examination: Secondary | ICD-10-CM

## 2023-01-08 LAB — BASIC METABOLIC PANEL
Anion gap: 10 (ref 5–15)
BUN: 20 mg/dL (ref 8–23)
CO2: 26 mmol/L (ref 22–32)
Calcium: 10 mg/dL (ref 8.9–10.3)
Chloride: 101 mmol/L (ref 98–111)
Creatinine, Ser: 0.71 mg/dL (ref 0.44–1.00)
GFR, Estimated: >60 mL/min (ref >60)
Glucose, Bld: 87 mg/dL (ref 70–99)
Potassium: 3.9 mmol/L (ref 3.5–5.1)
Sodium: 137 mmol/L (ref 135–145)

## 2023-01-08 LAB — CBC
HCT: 37.6 % (ref 36.0–46.0)
Hemoglobin: 12.5 g/dL (ref 12.0–15.0)
MCH: 31.9 pg (ref 26.0–34.0)
MCHC: 33.2 g/dL (ref 30.0–36.0)
MCV: 95.9 fL (ref 80.0–100.0)
Platelets: 233 10*3/uL (ref 150–400)
RBC: 3.92 MIL/uL (ref 3.87–5.11)
RDW: 13.6 % (ref 11.5–15.5)
WBC: 5.1 10*3/uL (ref 4.0–10.5)
nRBC: 0 % (ref 0.0–0.2)

## 2023-01-10 DIAGNOSIS — L649 Androgenic alopecia, unspecified: Secondary | ICD-10-CM | POA: Diagnosis not present

## 2023-01-16 NOTE — Anesthesia Preprocedure Evaluation (Signed)
Anesthesia Evaluation  Patient identified by MRN, date of birth, ID band Patient awake    Reviewed: Allergy & Precautions, NPO status , Patient's Chart, lab work & pertinent test results  Airway Mallampati: II  TM Distance: >3 FB Neck ROM: Full    Dental no notable dental hx. (+) Teeth Intact, Dental Advisory Given   Pulmonary neg pulmonary ROS   Pulmonary exam normal breath sounds clear to auscultation       Cardiovascular Exercise Tolerance: Good (-) hypertension(-) angina (-) Past MI negative cardio ROS Normal cardiovascular exam Rhythm:Regular Rate:Normal     Neuro/Psych  Neuromuscular disease    GI/Hepatic negative GI ROS, Neg liver ROS,,,  Endo/Other  Hypothyroidism    Renal/GU Lab Results      Component                Value               Date                        K                        3.9                 01/08/2023               CREATININE               0.71                01/08/2023                  negative genitourinary   Musculoskeletal  (+) Arthritis ,    Abdominal   Peds  Hematology Lab Results      Component                Value               Date                      WBC                      5.1                 01/08/2023                HGB                      12.5                01/08/2023                HCT                      37.6                01/08/2023                MCV                      95.9                01/08/2023                PLT  233                 01/08/2023              Anesthesia Other Findings All: [place]in  Mesenteric mass  Reproductive/Obstetrics negative OB ROS                             Anesthesia Physical Anesthesia Plan  ASA: 2  Anesthesia Plan: General   Post-op Pain Management: Ketamine IV* and Ofirmev IV (intra-op)*   Induction: Intravenous  PONV Risk Score and Plan: 4 or greater and Treatment may  vary due to age or medical condition, Midazolam, Ondansetron, Scopolamine patch - Pre-op and Dexamethasone  Airway Management Planned: Oral ETT  Additional Equipment: None  Intra-op Plan:   Post-operative Plan: Extubation in OR  Informed Consent: I have reviewed the patients History and Physical, chart, labs and discussed the procedure including the risks, benefits and alternatives for the proposed anesthesia with the patient or authorized representative who has indicated his/her understanding and acceptance.     Dental advisory given  Plan Discussed with: CRNA  Anesthesia Plan Comments:         Anesthesia Quick Evaluation

## 2023-01-17 ENCOUNTER — Encounter (HOSPITAL_COMMUNITY)
Admission: RE | Disposition: A | Discharge status: Home / Self Care | Payer: Self-pay | Source: Ambulatory Visit | Attending: General Surgery

## 2023-01-17 ENCOUNTER — Other Ambulatory Visit: Payer: Self-pay

## 2023-01-17 ENCOUNTER — Inpatient Hospital Stay (HOSPITAL_COMMUNITY): Payer: Medicare PPO | Admitting: Anesthesiology

## 2023-01-17 ENCOUNTER — Encounter (HOSPITAL_COMMUNITY): Payer: Self-pay | Admitting: General Surgery

## 2023-01-17 ENCOUNTER — Inpatient Hospital Stay (HOSPITAL_COMMUNITY): Payer: Self-pay | Admitting: Anesthesiology

## 2023-01-17 ENCOUNTER — Inpatient Hospital Stay (HOSPITAL_COMMUNITY)
Admission: RE | Admit: 2023-01-17 | Discharge: 2023-01-18 | DRG: 399 | Disposition: A | Discharge status: Home / Self Care | Payer: Medicare PPO | Source: Ambulatory Visit | Attending: General Surgery | Admitting: General Surgery

## 2023-01-17 DIAGNOSIS — K382 Diverticulum of appendix: Secondary | ICD-10-CM | POA: Diagnosis not present

## 2023-01-17 DIAGNOSIS — Z888 Allergy status to other drugs, medicaments and biological substances status: Secondary | ICD-10-CM | POA: Diagnosis not present

## 2023-01-17 DIAGNOSIS — Z90710 Acquired absence of both cervix and uterus: Secondary | ICD-10-CM | POA: Diagnosis not present

## 2023-01-17 DIAGNOSIS — Z79899 Other long term (current) drug therapy: Secondary | ICD-10-CM

## 2023-01-17 DIAGNOSIS — K6389 Other specified diseases of intestine: Secondary | ICD-10-CM

## 2023-01-17 DIAGNOSIS — K668 Other specified disorders of peritoneum: Secondary | ICD-10-CM | POA: Diagnosis not present

## 2023-01-17 DIAGNOSIS — D201 Benign neoplasm of soft tissue of peritoneum: Secondary | ICD-10-CM | POA: Diagnosis not present

## 2023-01-17 DIAGNOSIS — D121 Benign neoplasm of appendix: Principal | ICD-10-CM | POA: Diagnosis present

## 2023-01-17 DIAGNOSIS — Z96659 Presence of unspecified artificial knee joint: Secondary | ICD-10-CM | POA: Diagnosis present

## 2023-01-17 DIAGNOSIS — K660 Peritoneal adhesions (postprocedural) (postinfection): Secondary | ICD-10-CM | POA: Diagnosis not present

## 2023-01-17 DIAGNOSIS — E039 Hypothyroidism, unspecified: Secondary | ICD-10-CM | POA: Diagnosis present

## 2023-01-17 DIAGNOSIS — K353 Acute appendicitis with localized peritonitis, without perforation or gangrene: Secondary | ICD-10-CM | POA: Diagnosis not present

## 2023-01-17 DIAGNOSIS — K388 Other specified diseases of appendix: Secondary | ICD-10-CM | POA: Diagnosis not present

## 2023-01-17 DIAGNOSIS — R1904 Left lower quadrant abdominal swelling, mass and lump: Principal | ICD-10-CM | POA: Diagnosis present

## 2023-01-17 DIAGNOSIS — Z79890 Hormone replacement therapy (postmenopausal): Secondary | ICD-10-CM

## 2023-01-17 HISTORY — PX: LAPAROSCOPY: SHX197

## 2023-01-17 HISTORY — PX: APPENDECTOMY: SHX54

## 2023-01-17 HISTORY — PX: MASS EXCISION: SHX2000

## 2023-01-17 SURGERY — LAPAROSCOPY, DIAGNOSTIC
Anesthesia: General

## 2023-01-17 MED ORDER — TRAMADOL HCL 50 MG PO TABS: 50.0000 mg | ORAL_TABLET | Freq: Four times a day (QID) | ORAL | Status: DC | PRN

## 2023-01-17 MED ORDER — BUPIVACAINE LIPOSOME 1.3 % IJ SUSP: 20.0000 mL | Freq: Once | INTRAMUSCULAR | Status: DC

## 2023-01-17 MED ORDER — EPHEDRINE 5 MG/ML INJ
INTRAVENOUS | Status: AC
  Filled 2023-01-17: qty 5

## 2023-01-17 MED ORDER — SUGAMMADEX SODIUM 200 MG/2ML IV SOLN
INTRAVENOUS | Status: DC | PRN
  Administered 2023-01-17: 200 mg via INTRAVENOUS

## 2023-01-17 MED ORDER — LEVOTHYROXINE SODIUM 75 MCG PO TABS
75.0000 ug | ORAL_TABLET | Freq: Every day | ORAL | Status: DC
  Administered 2023-01-18: 75 ug via ORAL
  Filled 2023-01-17: qty 1

## 2023-01-17 MED ORDER — PROPOFOL 10 MG/ML IV BOLUS
INTRAVENOUS | Status: AC
Start: 2023-01-17 — End: ?
  Filled 2023-01-17: qty 20

## 2023-01-17 MED ORDER — BISACODYL 10 MG RE SUPP: 10.0000 mg | Freq: Every day | RECTAL | Status: DC | PRN

## 2023-01-17 MED ORDER — MINOXIDIL 2.5 MG PO TABS
1.2500 mg | ORAL_TABLET | Freq: Every day | ORAL | Status: DC
  Filled 2023-01-17: qty 0.5

## 2023-01-17 MED ORDER — BUPIVACAINE-EPINEPHRINE 0.25% -1:200000 IJ SOLN
INTRAMUSCULAR | Status: AC
  Filled 2023-01-17: qty 1

## 2023-01-17 MED ORDER — DEXAMETHASONE SODIUM PHOSPHATE 4 MG/ML IJ SOLN
INTRAMUSCULAR | Status: DC | PRN
  Administered 2023-01-17: 5 mg via INTRAVENOUS

## 2023-01-17 MED ORDER — KETOROLAC TROMETHAMINE 30 MG/ML IJ SOLN
INTRAMUSCULAR | Status: AC
  Filled 2023-01-17: qty 1

## 2023-01-17 MED ORDER — LACTATED RINGERS IV SOLN
INTRAVENOUS | Status: DC
Start: 2023-01-17 — End: 2023-01-17

## 2023-01-17 MED ORDER — KETOROLAC TROMETHAMINE 30 MG/ML IJ SOLN: 15.0000 mg | Freq: Once | INTRAMUSCULAR | Status: DC | PRN

## 2023-01-17 MED ORDER — ACETAMINOPHEN 500 MG PO TABS: 1000.0000 mg | ORAL_TABLET | Freq: Four times a day (QID) | ORAL | Status: DC | PRN

## 2023-01-17 MED ORDER — LIDOCAINE HCL 1 % IJ SOLN
INTRAMUSCULAR | Status: AC
  Filled 2023-01-17: qty 20

## 2023-01-17 MED ORDER — FENTANYL CITRATE (PF) 100 MCG/2ML IJ SOLN
INTRAMUSCULAR | Status: DC | PRN
  Administered 2023-01-17: 100 ug via INTRAVENOUS

## 2023-01-17 MED ORDER — SCOPOLAMINE 1 MG/3DAYS TD PT72
MEDICATED_PATCH | TRANSDERMAL | Status: DC | PRN
  Administered 2023-01-17: 1 via TRANSDERMAL

## 2023-01-17 MED ORDER — HYDROMORPHONE HCL 1 MG/ML IJ SOLN
INTRAMUSCULAR | Status: AC
  Filled 2023-01-17: qty 1

## 2023-01-17 MED ORDER — LIDOCAINE HCL (PF) 2 % IJ SOLN
INTRAMUSCULAR | Status: AC
  Filled 2023-01-17: qty 5

## 2023-01-17 MED ORDER — SIMVASTATIN 20 MG PO TABS
20.0000 mg | ORAL_TABLET | Freq: Every day | ORAL | Status: DC
Start: 2023-01-17 — End: 2023-01-18
  Administered 2023-01-17: 20 mg via ORAL
  Filled 2023-01-17: qty 1

## 2023-01-17 MED ORDER — ENOXAPARIN SODIUM 40 MG/0.4ML IJ SOSY
40.0000 mg | PREFILLED_SYRINGE | INTRAMUSCULAR | Status: DC
  Administered 2023-01-18: 40 mg via SUBCUTANEOUS
  Filled 2023-01-17: qty 0.4

## 2023-01-17 MED ORDER — SIMETHICONE 80 MG PO CHEW: 40.0000 mg | CHEWABLE_TABLET | Freq: Four times a day (QID) | ORAL | Status: DC | PRN

## 2023-01-17 MED ORDER — ROCURONIUM BROMIDE 10 MG/ML (PF) SYRINGE
PREFILLED_SYRINGE | INTRAVENOUS | Status: DC | PRN
  Administered 2023-01-17: 70 mg via INTRAVENOUS

## 2023-01-17 MED ORDER — LIDOCAINE 2% (20 MG/ML) 5 ML SYRINGE
INTRAMUSCULAR | Status: DC | PRN
  Administered 2023-01-17: 50 mg via INTRAVENOUS

## 2023-01-17 MED ORDER — BUPIVACAINE-EPINEPHRINE 0.25% -1:200000 IJ SOLN
INTRAMUSCULAR | Status: DC | PRN
  Administered 2023-01-17: 30 mL

## 2023-01-17 MED ORDER — DEXAMETHASONE SODIUM PHOSPHATE 10 MG/ML IJ SOLN
INTRAMUSCULAR | Status: AC
  Filled 2023-01-17: qty 1

## 2023-01-17 MED ORDER — HYDROMORPHONE HCL 1 MG/ML IJ SOLN
INTRAMUSCULAR | Status: AC
  Administered 2023-01-17: 0.25 mg via INTRAVENOUS
  Filled 2023-01-17: qty 1

## 2023-01-17 MED ORDER — FENTANYL CITRATE (PF) 100 MCG/2ML IJ SOLN
INTRAMUSCULAR | Status: AC
  Filled 2023-01-17: qty 2

## 2023-01-17 MED ORDER — TEMAZEPAM 15 MG PO CAPS
15.0000 mg | ORAL_CAPSULE | Freq: Every day | ORAL | Status: DC
  Administered 2023-01-17: 15 mg via ORAL
  Filled 2023-01-17: qty 2

## 2023-01-17 MED ORDER — SODIUM CHLORIDE 0.9 % IV SOLN
2.0000 g | INTRAVENOUS | Status: AC
  Administered 2023-01-17: 2 g via INTRAVENOUS
  Filled 2023-01-17: qty 2

## 2023-01-17 MED ORDER — ONDANSETRON HCL 4 MG/2ML IJ SOLN
INTRAMUSCULAR | Status: DC | PRN
  Administered 2023-01-17: 4 mg via INTRAVENOUS

## 2023-01-17 MED ORDER — DIPHENHYDRAMINE HCL 12.5 MG/5ML PO ELIX: 12.5000 mg | ORAL_SOLUTION | Freq: Four times a day (QID) | ORAL | Status: DC | PRN

## 2023-01-17 MED ORDER — DIPHENHYDRAMINE HCL 50 MG/ML IJ SOLN
12.5000 mg | Freq: Four times a day (QID) | INTRAMUSCULAR | Status: DC | PRN
Start: 2023-01-17 — End: 2023-01-18

## 2023-01-17 MED ORDER — ROCURONIUM BROMIDE 10 MG/ML (PF) SYRINGE
PREFILLED_SYRINGE | INTRAVENOUS | Status: AC
  Filled 2023-01-17: qty 10

## 2023-01-17 MED ORDER — BUPIVACAINE LIPOSOME 1.3 % IJ SUSP
INTRAMUSCULAR | Status: AC
  Filled 2023-01-17: qty 20

## 2023-01-17 MED ORDER — DICLOFENAC SODIUM 75 MG PO TBEC
75.0000 mg | DELAYED_RELEASE_TABLET | Freq: Two times a day (BID) | ORAL | Status: DC
  Filled 2023-01-17: qty 1

## 2023-01-17 MED ORDER — PROPOFOL 10 MG/ML IV BOLUS
INTRAVENOUS | Status: DC | PRN
  Administered 2023-01-17: 140 mg via INTRAVENOUS

## 2023-01-17 MED ORDER — EPHEDRINE SULFATE-NACL 50-0.9 MG/10ML-% IV SOSY
PREFILLED_SYRINGE | INTRAVENOUS | Status: DC | PRN
  Administered 2023-01-17 (×2): 7.5 mg via INTRAVENOUS

## 2023-01-17 MED ORDER — IBUPROFEN 400 MG PO TABS: 800.0000 mg | ORAL_TABLET | Freq: Three times a day (TID) | ORAL | Status: DC | PRN

## 2023-01-17 MED ORDER — HYDROMORPHONE HCL 1 MG/ML IJ SOLN
0.2500 mg | INTRAMUSCULAR | Status: DC | PRN
Start: 2023-01-17 — End: 2023-01-17

## 2023-01-17 MED ORDER — MIDAZOLAM HCL 2 MG/2ML IJ SOLN
INTRAMUSCULAR | Status: AC
  Filled 2023-01-17: qty 2

## 2023-01-17 MED ORDER — SCOPOLAMINE 1 MG/3DAYS TD PT72
MEDICATED_PATCH | TRANSDERMAL | Status: AC
  Filled 2023-01-17: qty 1

## 2023-01-17 MED ORDER — HYDROMORPHONE HCL 1 MG/ML IJ SOLN
INTRAMUSCULAR | Status: DC | PRN
  Administered 2023-01-17: 1 mg via INTRAVENOUS

## 2023-01-17 MED ORDER — KCL IN DEXTROSE-NACL 20-5-0.45 MEQ/L-%-% IV SOLN
INTRAVENOUS | Status: DC
  Filled 2023-01-17: qty 1000

## 2023-01-17 MED ORDER — KETOROLAC TROMETHAMINE 15 MG/ML IJ SOLN
INTRAMUSCULAR | Status: AC
  Administered 2023-01-17: 15 mg
  Filled 2023-01-17: qty 1

## 2023-01-17 MED ORDER — MIDAZOLAM HCL 5 MG/5ML IJ SOLN
INTRAMUSCULAR | Status: DC | PRN
  Administered 2023-01-17: 2 mg via INTRAVENOUS

## 2023-01-17 MED ORDER — ONDANSETRON HCL 4 MG/2ML IJ SOLN: 4.0000 mg | Freq: Once | INTRAMUSCULAR | Status: DC | PRN

## 2023-01-17 MED ORDER — ONDANSETRON HCL 4 MG/2ML IJ SOLN: 4.0000 mg | Freq: Four times a day (QID) | INTRAMUSCULAR | Status: DC | PRN

## 2023-01-17 MED ORDER — ONDANSETRON HCL 4 MG/2ML IJ SOLN
INTRAMUSCULAR | Status: AC
  Filled 2023-01-17: qty 2

## 2023-01-17 MED ORDER — ALPRAZOLAM 0.25 MG PO TABS: 0.2500 mg | ORAL_TABLET | Freq: Two times a day (BID) | ORAL | Status: DC | PRN

## 2023-01-17 MED ORDER — MORPHINE SULFATE (PF) 2 MG/ML IV SOLN: 2.0000 mg | INTRAVENOUS | Status: DC | PRN

## 2023-01-17 MED ORDER — ACETAMINOPHEN 500 MG PO TABS
1000.0000 mg | ORAL_TABLET | ORAL | Status: DC
  Filled 2023-01-17: qty 2

## 2023-01-17 MED ORDER — CHLORHEXIDINE GLUCONATE 0.12 % MT SOLN
15.0000 mL | Freq: Once | OROMUCOSAL | Status: AC
  Administered 2023-01-17: 15 mL via OROMUCOSAL

## 2023-01-17 MED ORDER — ONDANSETRON 4 MG PO TBDP: 4.0000 mg | ORAL_TABLET | Freq: Four times a day (QID) | ORAL | Status: DC | PRN

## 2023-01-17 MED ORDER — SODIUM CHLORIDE 0.9 % IV SOLN
2.0000 g | Freq: Two times a day (BID) | INTRAVENOUS | Status: AC
  Administered 2023-01-18: 2 g via INTRAVENOUS
  Filled 2023-01-17: qty 2

## 2023-01-17 MED ORDER — KETAMINE HCL 50 MG/5ML IJ SOSY
PREFILLED_SYRINGE | INTRAMUSCULAR | Status: AC
Start: 2023-01-17 — End: ?
  Filled 2023-01-17: qty 5

## 2023-01-17 MED ORDER — LIDOCAINE HCL (PF) 2 % IJ SOLN
INTRAMUSCULAR | Status: DC | PRN
  Administered 2023-01-17: 1 mg/kg/h via INTRADERMAL

## 2023-01-17 MED ORDER — SENNOSIDES-DOCUSATE SODIUM 8.6-50 MG PO TABS
1.0000 | ORAL_TABLET | Freq: Every day | ORAL | Status: DC
  Administered 2023-01-17: 1 via ORAL
  Filled 2023-01-17: qty 1

## 2023-01-17 MED ORDER — OXYCODONE HCL 5 MG/5ML PO SOLN: 5.0000 mg | Freq: Once | ORAL | Status: DC | PRN

## 2023-01-17 MED ORDER — KETAMINE HCL 50 MG/ML IJ SOLN
INTRAMUSCULAR | Status: DC | PRN
  Administered 2023-01-17: 20 mg via INTRAMUSCULAR
  Administered 2023-01-17: 10 mg via INTRAMUSCULAR
  Administered 2023-01-17: 20 mg via INTRAMUSCULAR

## 2023-01-17 MED ORDER — ORAL CARE MOUTH RINSE
15.0000 mL | Freq: Once | OROMUCOSAL | Status: AC
Start: 2023-01-17 — End: 2023-01-17

## 2023-01-17 MED ORDER — OXYCODONE HCL 5 MG PO TABS: 5.0000 mg | ORAL_TABLET | Freq: Once | ORAL | Status: DC | PRN

## 2023-01-17 SURGICAL SUPPLY — 65 items
ANTIFOG SOL W/FOAM PAD STRL (MISCELLANEOUS) ×1
APPLIER CLIP 5 13 M/L LIGAMAX5 (MISCELLANEOUS) ×1
APPLIER CLIP ROT 10 11.4 M/L (STAPLE)
BAG COUNTER SPONGE SURGICOUNT (BAG) IMPLANT
BENZOIN TINCTURE PRP APPL 2/3 (GAUZE/BANDAGES/DRESSINGS) IMPLANT
BLADE EXTENDED COATED 6.5IN (ELECTRODE) IMPLANT
BLADE SURG 15 STRL LF DISP TIS (BLADE) ×1 IMPLANT
BLADE SURG 15 STRL SS (BLADE) ×1
CHLORAPREP W/TINT 26 (MISCELLANEOUS) ×1 IMPLANT
CLIP APPLIE 5 13 M/L LIGAMAX5 (MISCELLANEOUS) IMPLANT
CLIP APPLIE ROT 10 11.4 M/L (STAPLE) IMPLANT
COVER BACK TABLE 60X90IN (DRAPES) ×1 IMPLANT
COVER MAYO STAND STRL (DRAPES) ×1 IMPLANT
DERMABOND ADVANCED .7 DNX12 (GAUZE/BANDAGES/DRESSINGS) ×1 IMPLANT
DRAPE LAPAROTOMY T 98X78 PEDS (DRAPES) ×1 IMPLANT
DRSG TEGADERM 4X4.75 (GAUZE/BANDAGES/DRESSINGS) IMPLANT
ELECT REM PT RETURN 15FT ADLT (MISCELLANEOUS) ×1 IMPLANT
GAUZE SPONGE 4X4 12PLY STRL (GAUZE/BANDAGES/DRESSINGS) ×1 IMPLANT
GLOVE BIO SURGEON STRL SZ 6.5 (GLOVE) ×2 IMPLANT
GLOVE INDICATOR 6.5 STRL GRN (GLOVE) ×2 IMPLANT
GOWN L4 XXLG W/PAP TWL (GOWN DISPOSABLE) ×1 IMPLANT
GOWN STRL REUS W/ TWL XL LVL3 (GOWN DISPOSABLE) ×3 IMPLANT
GOWN STRL REUS W/TWL XL LVL3 (GOWN DISPOSABLE) ×3
HANDLE SUCTION POOLE (INSTRUMENTS) IMPLANT
IRRIG SUCT STRYKERFLOW 2 WTIP (MISCELLANEOUS) ×1
IRRIGATION SUCT STRKRFLW 2 WTP (MISCELLANEOUS) ×1 IMPLANT
KIT TURNOVER KIT A (KITS) IMPLANT
NDL HYPO 25X1 1.5 SAFETY (NEEDLE) ×1 IMPLANT
NEEDLE HYPO 25X1 1.5 SAFETY (NEEDLE) ×1
NS IRRIG 1000ML POUR BTL (IV SOLUTION) IMPLANT
PACK COLON (CUSTOM PROCEDURE TRAY) ×1 IMPLANT
PENCIL SMOKE EVACUATOR (MISCELLANEOUS) IMPLANT
POUCH RETRIEVAL ECOSAC 10 (ENDOMECHANICALS) IMPLANT
SEALER TISSUE G2 STRG ARTC 35C (ENDOMECHANICALS) IMPLANT
SET TUBE SMOKE EVAC HIGH FLOW (TUBING) ×1 IMPLANT
SLEEVE ADV FIXATION 5X100MM (TROCAR) ×3 IMPLANT
SOLUTION ANTFG W/FOAM PAD STRL (MISCELLANEOUS) IMPLANT
SPIKE FLUID TRANSFER (MISCELLANEOUS) ×1 IMPLANT
STAPLER SKIN PROX WIDE 3.9 (STAPLE) IMPLANT
STRIP CLOSURE SKIN 1/2X4 (GAUZE/BANDAGES/DRESSINGS) IMPLANT
SUCTION POOLE HANDLE (INSTRUMENTS)
SUT ETHILON 4 0 PS 2 18 (SUTURE) IMPLANT
SUT MNCRL AB 4-0 PS2 18 (SUTURE) ×1 IMPLANT
SUT NOVA 1 T20/GS 25DT (SUTURE) IMPLANT
SUT PDS AB 1 CT1 27 (SUTURE) ×2 IMPLANT
SUT PDS AB 1 TP1 96 (SUTURE) IMPLANT
SUT PROLENE 2 0 KS (SUTURE) IMPLANT
SUT PROLENE 2 0 SH DA (SUTURE) IMPLANT
SUT SILK 2 0 (SUTURE) ×1
SUT SILK 2 0 SH (SUTURE) IMPLANT
SUT SILK 2 0 SH CR/8 (SUTURE) ×1 IMPLANT
SUT SILK 2-0 18XBRD TIE 12 (SUTURE) ×1 IMPLANT
SUT SILK 3 0 (SUTURE) ×1
SUT SILK 3 0 SH CR/8 (SUTURE) ×1 IMPLANT
SUT SILK 3-0 18XBRD TIE 12 (SUTURE) ×1 IMPLANT
SYR CONTROL 10ML LL (SYRINGE) ×1 IMPLANT
TOWEL OR 17X26 10 PK STRL BLUE (TOWEL DISPOSABLE) ×1 IMPLANT
TOWEL OR NON WOVEN STRL DISP B (DISPOSABLE) ×1 IMPLANT
TRAY FOLEY MTR SLVR 16FR STAT (SET/KITS/TRAYS/PACK) ×1 IMPLANT
TROCAR 11X100 Z THREAD (TROCAR) IMPLANT
TROCAR ADV FIXATION 5X100MM (TROCAR) ×1 IMPLANT
TROCAR BALLN 12MMX100 BLUNT (TROCAR) IMPLANT
TUBING CONNECTING 10 (TUBING) ×2 IMPLANT
UNDERPAD 30X36 HEAVY ABSORB (UNDERPADS AND DIAPERS) IMPLANT
YANKAUER SUCT BULB TIP NO VENT (SUCTIONS) IMPLANT

## 2023-01-17 NOTE — Op Note (Addendum)
Jumanah R Schliep [id number]   PRE-OPERATIVE DIAGNOSIS:  MESENTERIC MASS, DILATED APPENDIX  POST-OPERATIVE DIAGNOSIS:  OMENTAL MASS, DILATED APPENDIX   Procedure(s): LAPAROSCOPY DIAGNOSTIC APPENDECTOMY EXCISIONAL BIOPSY OF OMENTAL MASS     Surgeon(s): Romie Levee, MD Karie Soda, MD  ASSISTANT: Dr Michaell Cowing   ANESTHESIA:   local and general  EBL:   10ml  Delay start of Pharmacological VTE agent (>24hrs) due to surgical blood loss or risk of bleeding:  no  DRAINS: none   SPECIMEN:  Source of Specimen:  omental mass, appendix  DISPOSITION OF SPECIMEN:  PATHOLOGY  COUNTS:  YES  PLAN OF CARE: Admit for overnight observation  PATIENT DISPOSITION:  PACU - hemodynamically stable.   INDICATIONS: Patient with concerning symptoms & work up for dilated appendix and abdominal mass a.  Surgery was recommended:  The anatomy & physiology of the digestive tract was discussed.  I feel the risks of no intervention will lead to serious problems that outweigh the operative risks; therefore, I recommended diagnostic laparoscopy with removal of appendix and mass to remove the pathology.  Laparoscopic & open techniques were discussed.   I noted a good likelihood this will help address the problem.    Risks such as bleeding, infection, abscess, leak, reoperation, possible ostomy, hernia, heart attack, death, and other risks were discussed.  Goals of post-operative recovery were discussed as well.  We will work to minimize complications.  Questions were answered.  The patient expresses understanding & wishes to proceed with surgery.  OR FINDINGS: Mass adherent to omentum, dilated appendix  DESCRIPTION:   The patient was identified & brought into the operating room. The patient was positioned supine with left arm tucked. SCDs were active during the entire case. The patient underwent general anesthesia without any difficulty.  A foley catheter was inserted under sterile conditions. The abdomen was  prepped and draped in a sterile fashion. A Surgical Timeout confirmed our plan.   I made a transverse incision through the inferior umbilical fold.  I made a nick in the infraumbilical fascia and confirmed peritoneal entry.  I placed a stay suture and then the [facility] port.  We induced carbon dioxide insufflation.  Camera inspection revealed no injury.  I placed additional ports under direct laparoscopic visualization.  I identified the abdominal mass.  This was sitting in the left lower quadrant adherent to the omentum.  It appeared most consistent with an ectopic spleen.  The omental adhesions were divided using a laparoscopic Enseal device.  This was then placed into the pelvis for collection later.  I then turned my attention to the right lower quadrant.  The appendix was noted tracking down into the pelvis and dilated.  I mobilized the terminal ileum to proximal ascending colon in a lateral to medial fashion.  I took care to avoid injuring any retroperitoneal structures.  bo I freed the appendix off its attachments to the ascending colon and cecal mesentery.  I elevated the appendix.  I was able to free off the base of the appendix, which was still viable.  I stapled the appendix off the cecum using a laparoscopic bowel load stapler.  I took a healthy cuff viable cecum. I skeletonized & ligated the mesoappendix with the Enseal device.  I placed the appendix and the previously resected mass inside an Ecosac bag and removed out the [place] port.  There was a small area of bleeding on the staple line of the mesentery.  This was controlled with a 5mm clip.  Hemostasis  was good in the mesoappendix, colon mesentery, and retroperitoneum. Staple line was intact on the cecum with no bleeding.  I laparoscopically evaluated all 4 quadrants of the abdomen.  There were no lesions noted on the liver.  There were no other masses noted in the small or large bowel that could be easily visualized.  Uterus was surgically  absent.  I washed out the pelvis, retrohepatic space and right paracolic gutter.  Hemostasis is good. There was no perforation or injury.    I aspirated the carbon dioxide. I removed the ports. I closed the umbilical fascia site using a 0 Vicryl stitch. I closed skin using 4-0 vicryl stitch.  Sterile dressings were applied.  Patient was extubated and sent to the recovery room.  I discussed the operative findings with the patient's family. I suspect the patient is going used in the hospital at least overnight. Questions answered. They expressed understanding and appreciation.   Vanita Panda, MD  Colorectal and General Surgery [facility] Surgery

## 2023-01-17 NOTE — Anesthesia Postprocedure Evaluation (Signed)
Anesthesia Post Note  Patient: [name]  Procedure(s) Performed: LAPAROSCOPY DIAGNOSTIC APPENDECTOMY Excisional Biospy Omental Mass     Patient location during evaluation: PACU Anesthesia Type: General Level of consciousness: awake and alert Pain management: pain level controlled Vital Signs Assessment: post-procedure vital signs reviewed and stable Respiratory status: spontaneous breathing, nonlabored ventilation, respiratory function stable and patient connected to nasal cannula oxygen Cardiovascular status: blood pressure returned to baseline and stable Postop Assessment: no apparent nausea or vomiting Anesthetic complications: no   No notable events documented.  Last Vitals:  Vitals:   01/17/23 1815 01/17/23 1831  BP: (!) 146/76 (!) 149/87  Pulse: 81 88  Resp: 11 18  Temp: (!) 36.4 C 36.7 C  SpO2: 100% 100%    Last Pain:  Vitals:   01/17/23 1831  TempSrc: Oral  PainSc:                  Trevor Iha

## 2023-01-17 NOTE — Anesthesia Procedure Notes (Signed)
Procedure Name: Intubation Date/Time: 01/17/2023 4:25 PM  Performed by: Vanessa [place], CRNAPre-anesthesia Checklist: Patient identified, Emergency Drugs available, Suction available and Patient being monitored Patient Re-evaluated:Patient Re-evaluated prior to induction Oxygen Delivery Method: Circle system utilized Preoxygenation: Pre-oxygenation with 100% oxygen Induction Type: IV induction Ventilation: Mask ventilation without difficulty Laryngoscope Size: 2 and Miller Grade View: Grade I Tube type: Oral Tube size: 7.0 mm Number of attempts: 1 Airway Equipment and Method: Stylet Placement Confirmation: ETT inserted through vocal cords under direct vision, positive ETCO2 and breath sounds checked- equal and bilateral Secured at: 21 cm Tube secured with: Tape Dental Injury: Teeth and Oropharynx as per pre-operative assessment

## 2023-01-17 NOTE — Transfer of Care (Signed)
Immediate Anesthesia Transfer of Care Note  Patient: [name]  Procedure(s) Performed: LAPAROSCOPY DIAGNOSTIC APPENDECTOMY Excisional Biospy Omental Mass  Patient Location: PACU  Anesthesia Type:General  Level of Consciousness: awake  Airway & Oxygen Therapy: Patient Spontanous Breathing and Patient connected to face mask  Post-op Assessment: Report given to RN and Post -op Vital signs reviewed and stable  Post vital signs: Reviewed and stable  Last Vitals:  Vitals Value Taken Time  BP 162/77 01/17/23 1730  Temp    Pulse 85 01/17/23 1732  Resp 15 01/17/23 1732  SpO2 97 % 01/17/23 1732  Vitals shown include unfiled device data.  Last Pain:  Vitals:   01/17/23 1343  TempSrc:   PainSc: 0-No pain         Complications: No notable events documented.

## 2023-01-17 NOTE — H&P (Signed)
REFERRING PHYSICIAN:  Lupita Raider, MD   PROVIDER:  Elenora Gamma, MD   MRN: [id number] DOB: 22-Sep-1950    Subjective     History of Present Illness: [name] is a 72 y.o. female who is seen today as an office consultation at the request of Dr. Clelia Croft for evaluation of RE-CHECK  .  Patient recently hospitalized for dilated appendix, vomiting, diarrhea and left lower quadrant pain.  She was determined not to have appendicitis by exam.  MRI was performed due to a soft tissue mass in the left lower quadrant.  This appears to be in the mesentery of the small bowel.  She is here for further discussion on treatment for this.     Review of Systems: A complete review of systems was obtained from the patient.  I have reviewed this information and discussed as appropriate with the patient.  See HPI as well for other ROS.       Medical History:     Past Medical History:  Diagnosis Date   History of cancer     Thyroid disease        There is no problem list on file for this patient.          Past Surgical History:  Procedure Laterality Date   Lumbar laminectomy/decompression microdiscectomy   2024   Bladder suspension       Breast lumpectomy       ESSURE TUBAL LIGATION       HYSTERECTOMY       REVISION TOTAL KNEE ARTHROPLASTY               Allergies  Allergen Reactions   Gabapentin Other (See Comments)      Gave pt migraine   Migraines    Migraines            Current Outpatient Medications on File Prior to Visit  Medication Sig Dispense Refill   ALPRAZolam (XANAX) 0.5 MG tablet Take 0.5 mg by mouth at bedtime as needed for Sleep       cholecalciferol (VITAMIN D3) 1000 unit tablet Take 1,000 Units by mouth once daily       coenzyme Q10 400 mg Cap Take 400 mg by mouth once daily       denosumab (PROLIA) 60 mg/mL inj syringe Inject 1 mL by subcutaneous route.       levothyroxine (SYNTHROID) 75 MCG tablet TAKE 1 TABLET BY MOUTH EVERY DAY IN THE MORNING  ON EMPTY STOMACH       minoxidiL 2.5 MG tablet TAKE 1/2 TABLET BY MOUTH ONCE A DAY TAKE DAILY AS DIRECTED       omega-3 fatty acids-fish oil 300-1,000 mg capsule Take by mouth       simvastatin (ZOCOR) 20 MG tablet TAKE 1 TABLET BY MOUTH EVERY DAY IN THE EVENING FOR 90 DAYS       temazepam (RESTORIL) 15 mg capsule 1-2 CAPSULES AT BEDTIME ORALLY AS NEEDED FOR SLEEP        No current facility-administered medications on file prior to visit.      History reviewed. No pertinent family history.    Social History       Tobacco Use  Smoking Status Never  Smokeless Tobacco Never      Social History        Socioeconomic History   Marital status: Married  Tobacco Use   Smoking status: Never   Smokeless tobacco: Never  Substance and  Sexual Activity   Alcohol use: Yes   Drug use: Never      Objective:          Exam Gen: NAD Abd: soft, NT to palpation       Labs, Imaging and Diagnostic Testing: CT and MRI images and report reviewed   Assessment and Plan:  Diagnoses and all orders for this visit:   Left lower quadrant abdominal mass       72 year old female who recently hospitalized for some left lower quadrant pain nausea and diarrhea.  This has resolved.  On her CT scan a left lower quadrant mass was noted.  MRI was obtained to further evaluate this.  It appeared to be in the mesentery of the small bowel distally.  It is well-circumscribed and does not appear to be involving any other structures.  We discussed that diagnostic laparoscopy would be the next best step.  We would plan on excision of the mass and possible small bowel resection.  Given that she had a dilated appendix recently, we will also perform an appendectomy.  We have discussed the risk of this which include bleeding infection and damage to adjacent structures I think all of these risks are very minimal.  We also discussed risk of wound infection and hernias.     Vanita Panda, MD Colon and Rectal  Surgery [facility] Surgery

## 2023-01-18 ENCOUNTER — Encounter (HOSPITAL_COMMUNITY): Payer: Self-pay | Admitting: General Surgery

## 2023-01-18 ENCOUNTER — Other Ambulatory Visit (HOSPITAL_COMMUNITY): Payer: Self-pay

## 2023-01-18 MED ORDER — TRAMADOL HCL 50 MG PO TABS
50.0000 mg | ORAL_TABLET | Freq: Four times a day (QID) | ORAL | 0 refills | Status: AC | PRN
  Filled 2023-01-18: qty 20, 5d supply, fill #0

## 2023-01-18 NOTE — Discharge Summary (Signed)
Physician Discharge Summary  Patient ID: [name] MRN: [id number] DOB/AGE: 1950/04/22 72 y.o.  Admit date: 01/17/2023 Discharge date: 01/18/2023  Admission Diagnoses: abdominal pain  Discharge Diagnoses:  Principal Problem:   Abdominal mass, left lower quadrant   Discharged Condition: good  Hospital Course: Patient was admitted to the med surg floor after surgery.  Diet was advanced as tolerated.  By postop day 1, she was tolerating a solid diet and pain was controlled with oral medications.  She was urinating without difficulty and ambulating without assistance.  Patient was felt to be in stable condition for discharge to home.   Consults: None  Significant Diagnostic Studies: labs: cbc, bmet  Treatments: IV hydration and surgery: lap excisional biopsy and appendectomy  Discharge Exam: Blood pressure 119/60, pulse 76, temperature (!) 97.5 F (36.4 C), temperature source Oral, resp. rate 16, height 5\' 5"  (1.651 m), weight 60.8 kg, last menstrual period 03/05/1997, SpO2 98%. General appearance: alert and cooperative GI: soft, non-distended Incision/Wound: clean, dry, intact  Disposition: Discharge disposition: 01-Home or Self Care        Allergies as of 01/18/2023       Reactions   Gabapentin Other (See Comments)   Gave pt migraine        Medication List     TAKE these medications    acetaminophen 500 MG tablet Commonly known as: TYLENOL Take 1,000 mg by mouth as needed for moderate pain.   ALPRAZolam 0.5 MG tablet Commonly known as: XANAX Take 0.25-0.5 mg by mouth 2 (two) times daily as needed for anxiety.   ascorbic acid 500 MG tablet Commonly known as: VITAMIN C Take 500 mg by mouth in the morning.   CALTRATE MINIS PLUS MINERALS PO Take 1 tablet by mouth in the morning and at bedtime.   clobetasol ointment 0.05 % Commonly known as: TEMOVATE Apply 1 application  topically daily as needed (irritation).   CoQ10 400 MG Caps Take 400 mg by  mouth in the morning.   denosumab 60 MG/ML Soln injection Commonly known as: PROLIA Inject 60 mg into the skin every 6 (six) months. Administer in upper arm, thigh, or abdomen   diclofenac 75 MG EC tablet Commonly known as: VOLTAREN Take 1 tablet (75 mg total) by mouth 2 (two) times daily.   FISH OIL ULTRA PO Take 1,000 mg by mouth in the morning.   ibuprofen 200 MG tablet Commonly known as: ADVIL Take 800 mg by mouth every 8 (eight) hours as needed (pain.).   levothyroxine 75 MCG tablet Commonly known as: SYNTHROID Take 75 mcg by mouth daily before breakfast.   minoxidil 2.5 MG tablet Commonly known as: LONITEN Take 1.25 mg by mouth in the morning.   simvastatin 20 MG tablet Commonly known as: ZOCOR Take 20 mg by mouth at bedtime.   temazepam 15 MG capsule Commonly known as: RESTORIL Take 15-30 mg by mouth at bedtime.   traMADol 50 MG tablet Commonly known as: ULTRAM Take 1 tablet (50 mg total) by mouth every 6 (six) hours as needed for moderate pain (pain score 4-6).   tretinoin 0.05 % cream Commonly known as: RETIN-A Apply 1 Application topically at bedtime.   Vitamin D 125 MCG (5000 UT) Caps Take 5,000 Units by mouth in the morning.        Follow-up Information     Romie Levee, MD. Schedule an appointment as soon as possible for a visit in 2 week(s).   Specialties: General Surgery, Colon and Rectal Surgery  Contact information: [address] [place] [address] [phone]                 Signed: Vanita Panda 01/18/2023, 8:07 AM

## 2023-01-18 NOTE — Progress Notes (Signed)
   01/18/23 0950  TOC Brief Assessment  Insurance and Status Reviewed  Patient has primary care physician Yes  Home environment has been reviewed home with spouse  Prior level of function: independent  Prior/Current Home Services No current home services  Social Determinants of Health Reivew SDOH reviewed no interventions necessary  Readmission risk has been reviewed Yes  Transition of care needs no transition of care needs at this time

## 2023-01-18 NOTE — Plan of Care (Signed)

## 2023-01-18 NOTE — Progress Notes (Signed)
Discharge instructions discussed with patient and family, verbalized agreement and understanding 

## 2023-01-18 NOTE — Discharge Instructions (Signed)
LAPAROSCOPIC SURGERY: POST OP INSTRUCTIONS  DIET: Follow a light bland diet the first 24 hours after arrival home, such as soup, liquids, crackers, etc.  Be sure to include lots of fluids daily.  Avoid fast food or heavy meals as your are more likely to get nauseated.  Eat a low fat the next few days after surgery.   Take your usually prescribed home medications unless otherwise directed. PAIN CONTROL: Pain is best controlled by a usual combination of three different methods TOGETHER: Ice/Heat Over the counter pain medication Prescription pain medication Most patients will experience some swelling and bruising around the incisions.  Ice packs or heating pads (30-60 minutes up to 6 times a day) will help. Use ice for the first few days to help decrease swelling and bruising, then switch to heat to help relax tight/sore spots and speed recovery.  Some people prefer to use ice alone, heat alone, alternating between ice & heat.  Experiment to what works for you.  Swelling and bruising can take several weeks to resolve.   It is helpful to take an over-the-counter pain medication regularly for the first few weeks.  Choose one of the following that works best for you: Naproxen (Aleve, etc)  Two 220mg tabs twice a day Ibuprofen (Advil, etc) Three 200mg tabs four times a day (every meal & bedtime) A  prescription for pain medication (such as percocet, vicodin, oxycodone, hydrocodone, etc) should be given to you upon discharge.  Take your pain medication as prescribed.  If you are having problems/concerns with the prescription medicine (does not control pain, nausea, vomiting, rash, itching, etc), please call us [phone] to see if we need to switch you to a different pain medicine that will work better for you and/or control your side effect better. If you need a refill on your pain medication, please contact your pharmacy.  They will contact our office to request authorization. Prescriptions will not be  filled after 5 pm or on week-ends.   Avoid getting constipated.  Between the surgery and the pain medications, it is common to experience some constipation.  Increasing fluid intake and taking a fiber supplement (such as Metamucil, Citrucel, FiberCon, MiraLax, etc) 1-2 times a day regularly will usually help prevent this problem from occurring.  A mild laxative (prune juice, Milk of Magnesia, MiraLax, etc) should be taken according to package directions if there are no bowel movements after 48 hours.   Watch out for diarrhea.  If you have many loose bowel movements, simplify your diet to bland foods & liquids for a few days.  Stop any stool softeners and decrease your fiber supplement.  Switching to mild anti-diarrheal medications (Kayopectate, Pepto Bismol) can help.  If this worsens or does not improve, please call us. Wash / shower every day.  You may shower over the dressings as they are waterproof.  Continue to shower over incision(s) after the dressing is off. Remove your waterproof bandages 5 days after surgery.  You may leave the incision open to air.  You may replace a dressing/Band-Aid to cover the incision for comfort if you wish.  ACTIVITIES as tolerated:   You may resume regular (light) daily activities beginning the next day--such as daily self-care, walking, climbing stairs--gradually increasing activities as tolerated.  If you can walk 30 minutes without difficulty, it is safe to try more intense activity such as jogging, treadmill, bicycling, low-impact aerobics, swimming, etc. Save the most intensive and strenuous activity for last such as sit-ups, heavy   lifting, contact sports, etc  Refrain from any heavy lifting or straining until you are off narcotics for pain control.   DO NOT PUSH THROUGH PAIN.  Let pain be your guide: If it hurts to do something, don't do it.  Pain is your body warning you to avoid that activity for another week until the pain goes down. You may drive when you are  no longer taking prescription pain medication, you can comfortably wear a seatbelt, and you can safely maneuver your car and apply brakes. You may have sexual intercourse when it is comfortable.  FOLLOW UP in our office Please call [facility] at [phone] to set up an appointment to see your surgeon in the office for a follow-up appointment approximately 2-3 weeks after your surgery. Make sure that you call for this appointment the day you arrive home to insure a convenient appointment time. 10. IF YOU HAVE DISABILITY OR FAMILY LEAVE FORMS, BRING THEM TO THE OFFICE FOR PROCESSING.  DO NOT GIVE THEM TO YOUR DOCTOR.   WHEN TO CALL US [phone]: Poor pain control Reactions / problems with new medications (rash/itching, nausea, etc)  Fever over 101.5 F (38.5 C) Inability to urinate Nausea and/or vomiting Worsening swelling or bruising Continued bleeding from incision. Increased pain, redness, or drainage from the incision   The clinic staff is available to answer your questions during regular business hours (8:30am-5pm).  Please don't hesitate to call and ask to speak to one of our nurses for clinical concerns.   If you have a medical emergency, go to the nearest emergency room or call 911.  A surgeon from [facility] is always on call at the hospitals   [facility] [address], [address], [place], [place]  [address] ? MAIN: [phone] ? TOLL FREE: [phone] ?  FAX [phone] [email or web address]   

## 2023-01-21 LAB — SURGICAL PATHOLOGY

## 2023-01-22 DIAGNOSIS — Z9889 Other specified postprocedural states: Secondary | ICD-10-CM | POA: Diagnosis not present

## 2023-01-23 DIAGNOSIS — Z9889 Other specified postprocedural states: Secondary | ICD-10-CM | POA: Diagnosis not present

## 2023-01-23 DIAGNOSIS — D649 Anemia, unspecified: Secondary | ICD-10-CM | POA: Diagnosis not present

## 2023-02-05 DIAGNOSIS — F411 Generalized anxiety disorder: Secondary | ICD-10-CM | POA: Diagnosis not present

## 2023-02-07 DIAGNOSIS — H6090 Unspecified otitis externa, unspecified ear: Secondary | ICD-10-CM | POA: Diagnosis not present

## 2023-02-07 DIAGNOSIS — H6120 Impacted cerumen, unspecified ear: Secondary | ICD-10-CM | POA: Diagnosis not present

## 2023-03-19 DIAGNOSIS — M810 Age-related osteoporosis without current pathological fracture: Secondary | ICD-10-CM | POA: Diagnosis not present

## 2023-04-02 DIAGNOSIS — F411 Generalized anxiety disorder: Secondary | ICD-10-CM | POA: Diagnosis not present

## 2023-04-29 DIAGNOSIS — F411 Generalized anxiety disorder: Secondary | ICD-10-CM | POA: Diagnosis not present

## 2023-05-06 DIAGNOSIS — H52203 Unspecified astigmatism, bilateral: Secondary | ICD-10-CM | POA: Diagnosis not present

## 2023-05-06 DIAGNOSIS — H26493 Other secondary cataract, bilateral: Secondary | ICD-10-CM | POA: Diagnosis not present

## 2023-05-06 DIAGNOSIS — Z961 Presence of intraocular lens: Secondary | ICD-10-CM | POA: Diagnosis not present

## 2023-05-20 DIAGNOSIS — F411 Generalized anxiety disorder: Secondary | ICD-10-CM | POA: Diagnosis not present

## 2023-06-05 DIAGNOSIS — M2550 Pain in unspecified joint: Secondary | ICD-10-CM | POA: Diagnosis not present

## 2023-06-10 DIAGNOSIS — F411 Generalized anxiety disorder: Secondary | ICD-10-CM | POA: Diagnosis not present

## 2023-06-11 DIAGNOSIS — C50911 Malignant neoplasm of unspecified site of right female breast: Secondary | ICD-10-CM | POA: Diagnosis not present

## 2023-06-13 DIAGNOSIS — H02421 Myogenic ptosis of right eyelid: Secondary | ICD-10-CM | POA: Diagnosis not present

## 2023-06-13 DIAGNOSIS — Z01818 Encounter for other preprocedural examination: Secondary | ICD-10-CM | POA: Diagnosis not present

## 2023-06-13 DIAGNOSIS — H53483 Generalized contraction of visual field, bilateral: Secondary | ICD-10-CM | POA: Diagnosis not present

## 2023-06-13 DIAGNOSIS — H02422 Myogenic ptosis of left eyelid: Secondary | ICD-10-CM | POA: Diagnosis not present

## 2023-06-13 DIAGNOSIS — H02831 Dermatochalasis of right upper eyelid: Secondary | ICD-10-CM | POA: Diagnosis not present

## 2023-06-13 DIAGNOSIS — H02834 Dermatochalasis of left upper eyelid: Secondary | ICD-10-CM | POA: Diagnosis not present

## 2023-06-13 DIAGNOSIS — H57813 Brow ptosis, bilateral: Secondary | ICD-10-CM | POA: Diagnosis not present

## 2023-06-13 DIAGNOSIS — H02423 Myogenic ptosis of bilateral eyelids: Secondary | ICD-10-CM | POA: Diagnosis not present

## 2023-06-13 DIAGNOSIS — H0279 Other degenerative disorders of eyelid and periocular area: Secondary | ICD-10-CM | POA: Diagnosis not present

## 2023-07-02 DIAGNOSIS — F411 Generalized anxiety disorder: Secondary | ICD-10-CM | POA: Diagnosis not present

## 2023-07-11 ENCOUNTER — Other Ambulatory Visit: Payer: Self-pay | Admitting: Family Medicine

## 2023-07-11 DIAGNOSIS — Z1231 Encounter for screening mammogram for malignant neoplasm of breast: Secondary | ICD-10-CM

## 2023-07-23 DIAGNOSIS — F411 Generalized anxiety disorder: Secondary | ICD-10-CM | POA: Diagnosis not present

## 2023-08-08 ENCOUNTER — Ambulatory Visit: Admitting: Family Medicine

## 2023-08-08 VITALS — BP 132/78 | Ht 65.25 in | Wt 130.0 lb

## 2023-08-08 DIAGNOSIS — M25561 Pain in right knee: Secondary | ICD-10-CM

## 2023-08-08 DIAGNOSIS — M25551 Pain in right hip: Secondary | ICD-10-CM | POA: Diagnosis not present

## 2023-08-08 NOTE — Patient Instructions (Signed)
 You have piriformis syndrome and some quad spasms. Do home exercises and stretches as directed. Advil  as needed for pain and inflammation. Heat 15 minutes at a time as needed. Consider formal physical therapy. Some good stability shoes include brooks adrenaline, on cloudmonsters, new balance though getting fitted at [employer] usually helps you try one you like the most. Follow up with me in 5-6 weeks.

## 2023-08-09 ENCOUNTER — Encounter: Payer: Self-pay | Admitting: Family Medicine

## 2023-08-09 NOTE — Progress Notes (Signed)
 PCP: Glena Landau, MD  Subjective:   HPI: Patient is a 73 y.o. female here for right hip and knee pain.  Patient reports she's had off and on pain in right knee and right hip for past few months. Both knees ache at times though anterior right knee above patella will hurt specifically at times. No swelling or bruising. No injury, catching, giving out. Also with posterior right hip pain - no back pain with this or groin pain. No numbness.  Past Medical History:  Diagnosis Date   Anal fissure    Breast cancer, right breast (HCC) 11/1997   S/P lumphectomy, chemo,  and radiation therapy   Broken arm 08/2014   right   Hypercholesteremia    Hypothyroidism    Osteoarthritis    "knees; maybe in my lower back" (04/23/2017)   Personal history of chemotherapy    Personal history of radiation therapy    PONV (postoperative nausea and vomiting)    Thyroid  disease    hypothyroid    Current Outpatient Medications on File Prior to Visit  Medication Sig Dispense Refill   acetaminophen  (TYLENOL ) 500 MG tablet Take 1,000 mg by mouth as needed for moderate pain.     ALPRAZolam  (XANAX ) 0.5 MG tablet Take 0.25-0.5 mg by mouth 2 (two) times daily as needed for anxiety.     ascorbic acid (VITAMIN C) 500 MG tablet Take 500 mg by mouth in the morning.     Calcium  Carbonate-Vit D-Min (CALTRATE MINIS PLUS MINERALS PO) Take 1 tablet by mouth in the morning and at bedtime.     Cholecalciferol (VITAMIN D ) 125 MCG (5000 UT) CAPS Take 5,000 Units by mouth in the morning.     clobetasol ointment (TEMOVATE) 0.05 % Apply 1 application  topically daily as needed (irritation).     Coenzyme Q10 (COQ10) 400 MG CAPS Take 400 mg by mouth in the morning.     denosumab  (PROLIA ) 60 MG/ML SOLN injection Inject 60 mg into the skin every 6 (six) months. Administer in upper arm, thigh, or abdomen     diclofenac  (VOLTAREN ) 75 MG EC tablet Take 1 tablet (75 mg total) by mouth 2 (two) times daily. 60 tablet 1   ibuprofen   (ADVIL ) 200 MG tablet Take 800 mg by mouth every 8 (eight) hours as needed (pain.).     levothyroxine  (SYNTHROID , LEVOTHROID) 75 MCG tablet Take 75 mcg by mouth daily before breakfast.      minoxidil  (LONITEN ) 2.5 MG tablet Take 1.25 mg by mouth in the morning.     Omega-3 Fatty Acids (FISH OIL ULTRA PO) Take 1,000 mg by mouth in the morning.     simvastatin  (ZOCOR ) 20 MG tablet Take 20 mg by mouth at bedtime.     temazepam  (RESTORIL ) 15 MG capsule Take 15-30 mg by mouth at bedtime.     traMADol  (ULTRAM ) 50 MG tablet Take 1 tablet (50 mg total) by mouth every 6 (six) hours as needed for moderate pain (pain score 4-6). 20 tablet 0   tretinoin (RETIN-A) 0.05 % cream Apply 1 Application topically at bedtime.     No current facility-administered medications on file prior to visit.    Past Surgical History:  Procedure Laterality Date   APPENDECTOMY N/A 01/17/2023   Procedure: APPENDECTOMY;  Surgeon: Joyce Nixon, MD;  Location: [facility];  Service: General;  Laterality: N/A;   BLADDER SUSPENSION  02/2002   sparc   BREAST BIOPSY Right 1999   BREAST LUMPECTOMY Right 11/1997   EYE  SURGERY Bilateral    bilateral cataract removal   LAPAROSCOPY N/A 01/17/2023   Procedure: LAPAROSCOPY DIAGNOSTIC;  Surgeon: Joyce Nixon, MD;  Location: [facility];  Service: General;  Laterality: N/A;  90   LUMBAR LAMINECTOMY/DECOMPRESSION MICRODISCECTOMY Left 06/20/2022   Procedure: Left Lumbar Four-Five Hemilaminectomy with resection of synovial cyst;  Surgeon: Isadora Mar, MD;  Location: [facility] OR;  Service: Neurosurgery;  Laterality: Left;  3C   MASS EXCISION N/A 01/17/2023   Procedure: Excisional Biospy Omental Mass;  Surgeon: Joyce Nixon, MD;  Location: [facility];  Service: General;  Laterality: N/A;   MOUTH SURGERY     "dental implants"   NASAL SINUS SURGERY  12/2015   TOTAL KNEE ARTHROPLASTY Right 04/22/2017   Procedure: TOTAL KNEE ARTHROPLASTY;  Surgeon: Wendolyn Hamburger, MD;  Location: [facility] OR;  Service:  Orthopedics;  Laterality: Right;   TOTAL KNEE ARTHROPLASTY Left 04/2019   TUBAL LIGATION     VAGINAL HYSTERECTOMY  02/2002   LAVH-BSO    Allergies  Allergen Reactions   Gabapentin  Other (See Comments)    Gave pt migraine    BP 132/78   Ht 5' 5.25" (1.657 m)   Wt 130 lb (59 kg)   LMP 03/05/1997   BMI 21.47 kg/m      01/23/2021    8:45 AM  [facility]  Frequency of aerobic exercise (# of days/week) 4  Average time in minutes 30  Frequency of strengthening activities (# of days/week) 3        No data to display             Objective:  Physical Exam:  Gen: NAD, comfortable in exam room  Right knee: No gross deformity, ecchymoses, swelling. No TTP currently. FROM with normal strength. Negative ant/post drawers. Negative valgus/varus testing. Negative lachman. Negative mcmurrays, apleys. NV intact distally.  Right hip: No deformity. Full range of motion with 5/5 strength. Tenderness to palpation over piriformis and just inferior to this. Neurovascularly intact distally. Negative logroll Negative faber, fadir, and piriformis stretches.   Assessment & Plan:  1. Right knee pain - localized to distal quad though otherwise normal exam.  Home exercises reveiwed.  Heat, advil  as needed.    2. Right hip pain - localized to external rotators but also reassuring exam otherwise.  Advil , heat as needed.  Follow up in 5-6 weeks.

## 2023-08-12 ENCOUNTER — Encounter: Payer: Self-pay | Admitting: Family Medicine

## 2023-08-13 DIAGNOSIS — F411 Generalized anxiety disorder: Secondary | ICD-10-CM | POA: Diagnosis not present

## 2023-08-26 ENCOUNTER — Ambulatory Visit
Admission: RE | Admit: 2023-08-26 | Discharge: 2023-08-26 | Disposition: A | Discharge status: Home / Self Care | Source: Ambulatory Visit | Attending: Family Medicine | Admitting: Family Medicine

## 2023-08-26 DIAGNOSIS — Z1231 Encounter for screening mammogram for malignant neoplasm of breast: Secondary | ICD-10-CM

## 2023-09-09 DIAGNOSIS — G4700 Insomnia, unspecified: Secondary | ICD-10-CM | POA: Diagnosis not present

## 2023-09-09 DIAGNOSIS — Z0000 Encounter for general adult medical examination without abnormal findings: Secondary | ICD-10-CM | POA: Diagnosis not present

## 2023-09-09 DIAGNOSIS — M810 Age-related osteoporosis without current pathological fracture: Secondary | ICD-10-CM | POA: Diagnosis not present

## 2023-09-09 DIAGNOSIS — F411 Generalized anxiety disorder: Secondary | ICD-10-CM | POA: Diagnosis not present

## 2023-09-09 DIAGNOSIS — L659 Nonscarring hair loss, unspecified: Secondary | ICD-10-CM | POA: Diagnosis not present

## 2023-09-09 DIAGNOSIS — E7800 Pure hypercholesterolemia, unspecified: Secondary | ICD-10-CM | POA: Diagnosis not present

## 2023-09-09 DIAGNOSIS — Z131 Encounter for screening for diabetes mellitus: Secondary | ICD-10-CM | POA: Diagnosis not present

## 2023-09-09 DIAGNOSIS — E039 Hypothyroidism, unspecified: Secondary | ICD-10-CM | POA: Diagnosis not present

## 2023-09-09 DIAGNOSIS — Z1331 Encounter for screening for depression: Secondary | ICD-10-CM | POA: Diagnosis not present

## 2023-09-25 DIAGNOSIS — R29890 Loss of height: Secondary | ICD-10-CM | POA: Diagnosis not present

## 2023-09-25 DIAGNOSIS — M8588 Other specified disorders of bone density and structure, other site: Secondary | ICD-10-CM | POA: Diagnosis not present

## 2023-09-27 DIAGNOSIS — E871 Hypo-osmolality and hyponatremia: Secondary | ICD-10-CM | POA: Diagnosis not present

## 2023-09-27 DIAGNOSIS — M810 Age-related osteoporosis without current pathological fracture: Secondary | ICD-10-CM | POA: Diagnosis not present

## 2023-10-01 DIAGNOSIS — L247 Irritant contact dermatitis due to plants, except food: Secondary | ICD-10-CM | POA: Diagnosis not present

## 2023-10-02 DIAGNOSIS — F411 Generalized anxiety disorder: Secondary | ICD-10-CM | POA: Diagnosis not present

## 2023-10-11 DIAGNOSIS — R252 Cramp and spasm: Secondary | ICD-10-CM | POA: Diagnosis not present

## 2023-10-14 DIAGNOSIS — H02831 Dermatochalasis of right upper eyelid: Secondary | ICD-10-CM | POA: Diagnosis not present

## 2023-10-14 DIAGNOSIS — H02834 Dermatochalasis of left upper eyelid: Secondary | ICD-10-CM | POA: Diagnosis not present

## 2023-11-08 DIAGNOSIS — F411 Generalized anxiety disorder: Secondary | ICD-10-CM | POA: Diagnosis not present

## 2023-11-20 DIAGNOSIS — R32 Unspecified urinary incontinence: Secondary | ICD-10-CM | POA: Diagnosis not present

## 2023-11-20 DIAGNOSIS — N952 Postmenopausal atrophic vaginitis: Secondary | ICD-10-CM | POA: Diagnosis not present

## 2023-11-20 DIAGNOSIS — Z90710 Acquired absence of both cervix and uterus: Secondary | ICD-10-CM | POA: Diagnosis not present

## 2023-11-20 DIAGNOSIS — Z853 Personal history of malignant neoplasm of breast: Secondary | ICD-10-CM | POA: Diagnosis not present

## 2023-12-04 ENCOUNTER — Encounter: Payer: Self-pay | Admitting: Family Medicine

## 2023-12-04 DIAGNOSIS — R351 Nocturia: Secondary | ICD-10-CM | POA: Diagnosis not present

## 2023-12-04 DIAGNOSIS — M6281 Muscle weakness (generalized): Secondary | ICD-10-CM | POA: Diagnosis not present

## 2023-12-04 DIAGNOSIS — N3941 Urge incontinence: Secondary | ICD-10-CM | POA: Diagnosis not present

## 2023-12-04 DIAGNOSIS — M62838 Other muscle spasm: Secondary | ICD-10-CM | POA: Diagnosis not present

## 2023-12-18 DIAGNOSIS — M62838 Other muscle spasm: Secondary | ICD-10-CM | POA: Diagnosis not present

## 2023-12-18 DIAGNOSIS — R351 Nocturia: Secondary | ICD-10-CM | POA: Diagnosis not present

## 2023-12-18 DIAGNOSIS — N3941 Urge incontinence: Secondary | ICD-10-CM | POA: Diagnosis not present

## 2023-12-18 DIAGNOSIS — M6281 Muscle weakness (generalized): Secondary | ICD-10-CM | POA: Diagnosis not present

## 2023-12-21 DIAGNOSIS — Z23 Encounter for immunization: Secondary | ICD-10-CM | POA: Diagnosis not present

## 2023-12-24 DIAGNOSIS — F411 Generalized anxiety disorder: Secondary | ICD-10-CM | POA: Diagnosis not present

## 2023-12-25 DIAGNOSIS — N3941 Urge incontinence: Secondary | ICD-10-CM | POA: Diagnosis not present

## 2023-12-25 DIAGNOSIS — M62838 Other muscle spasm: Secondary | ICD-10-CM | POA: Diagnosis not present

## 2023-12-25 DIAGNOSIS — M6281 Muscle weakness (generalized): Secondary | ICD-10-CM | POA: Diagnosis not present

## 2023-12-25 DIAGNOSIS — R351 Nocturia: Secondary | ICD-10-CM | POA: Diagnosis not present

## 2024-01-05 DIAGNOSIS — J0110 Acute frontal sinusitis, unspecified: Secondary | ICD-10-CM | POA: Diagnosis not present

## 2024-01-05 DIAGNOSIS — R0982 Postnasal drip: Secondary | ICD-10-CM | POA: Diagnosis not present

## 2024-01-05 DIAGNOSIS — R051 Acute cough: Secondary | ICD-10-CM | POA: Diagnosis not present

## 2024-01-07 DIAGNOSIS — L649 Androgenic alopecia, unspecified: Secondary | ICD-10-CM | POA: Diagnosis not present

## 2024-01-08 DIAGNOSIS — Z9189 Other specified personal risk factors, not elsewhere classified: Secondary | ICD-10-CM | POA: Diagnosis not present

## 2024-01-08 DIAGNOSIS — M8580 Other specified disorders of bone density and structure, unspecified site: Secondary | ICD-10-CM | POA: Diagnosis not present

## 2024-01-08 DIAGNOSIS — Z853 Personal history of malignant neoplasm of breast: Secondary | ICD-10-CM | POA: Diagnosis not present

## 2024-01-08 DIAGNOSIS — N8189 Other female genital prolapse: Secondary | ICD-10-CM | POA: Diagnosis not present

## 2024-01-08 DIAGNOSIS — Z01419 Encounter for gynecological examination (general) (routine) without abnormal findings: Secondary | ICD-10-CM | POA: Diagnosis not present

## 2024-02-05 DIAGNOSIS — R351 Nocturia: Secondary | ICD-10-CM | POA: Diagnosis not present

## 2024-02-05 DIAGNOSIS — N3941 Urge incontinence: Secondary | ICD-10-CM | POA: Diagnosis not present

## 2024-02-05 DIAGNOSIS — M6281 Muscle weakness (generalized): Secondary | ICD-10-CM | POA: Diagnosis not present

## 2024-02-05 DIAGNOSIS — M62838 Other muscle spasm: Secondary | ICD-10-CM | POA: Diagnosis not present

## 2024-02-11 DIAGNOSIS — R682 Dry mouth, unspecified: Secondary | ICD-10-CM | POA: Diagnosis not present

## 2024-02-11 DIAGNOSIS — K140 Glossitis: Secondary | ICD-10-CM | POA: Diagnosis not present
